# Patient Record
Sex: Male | Born: 1970 | Race: White | Hispanic: No | Marital: Married | Smoking: Current some day smoker
Health system: Southern US, Community
[De-identification: ages and names within clinical notes are randomized; demographics above are authoritative.]

## PROBLEM LIST (undated history)

## (undated) DIAGNOSIS — I10 Essential (primary) hypertension: Secondary | ICD-10-CM

## (undated) DIAGNOSIS — F17200 Nicotine dependence, unspecified, uncomplicated: Secondary | ICD-10-CM

## (undated) DIAGNOSIS — I5021 Acute systolic (congestive) heart failure: Secondary | ICD-10-CM

## (undated) DIAGNOSIS — G4733 Obstructive sleep apnea (adult) (pediatric): Secondary | ICD-10-CM

## (undated) DIAGNOSIS — E1151 Type 2 diabetes mellitus with diabetic peripheral angiopathy without gangrene: Secondary | ICD-10-CM

## (undated) DIAGNOSIS — Z95810 Presence of automatic (implantable) cardiac defibrillator: Secondary | ICD-10-CM

## (undated) DIAGNOSIS — E11621 Type 2 diabetes mellitus with foot ulcer: Secondary | ICD-10-CM

## (undated) DIAGNOSIS — E119 Type 2 diabetes mellitus without complications: Secondary | ICD-10-CM

## (undated) DIAGNOSIS — I739 Peripheral vascular disease, unspecified: Secondary | ICD-10-CM

## (undated) DIAGNOSIS — K219 Gastro-esophageal reflux disease without esophagitis: Secondary | ICD-10-CM

## (undated) DIAGNOSIS — M869 Osteomyelitis, unspecified: Secondary | ICD-10-CM

## (undated) HISTORY — DX: Obstructive sleep apnea (adult) (pediatric): G47.33

---
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
## 1999-03-07 ENCOUNTER — Emergency Department (HOSPITAL_COMMUNITY): Admission: EM | Admit: 1999-03-07 | Discharge: 1999-03-07 | Payer: Self-pay | Admitting: Emergency Medicine

## 2001-10-09 ENCOUNTER — Emergency Department (HOSPITAL_COMMUNITY): Admission: EM | Admit: 2001-10-09 | Discharge: 2001-10-09 | Payer: Self-pay | Admitting: Emergency Medicine

## 2002-04-04 ENCOUNTER — Encounter: Payer: Self-pay | Admitting: Emergency Medicine

## 2002-04-04 ENCOUNTER — Emergency Department (HOSPITAL_COMMUNITY): Admission: EM | Admit: 2002-04-04 | Discharge: 2002-04-04 | Payer: Self-pay | Admitting: Emergency Medicine

## 2003-04-03 ENCOUNTER — Encounter: Admission: RE | Admit: 2003-04-03 | Discharge: 2003-07-02 | Payer: Self-pay | Admitting: Internal Medicine

## 2004-06-14 ENCOUNTER — Ambulatory Visit: Payer: Self-pay | Admitting: Internal Medicine

## 2004-06-27 ENCOUNTER — Ambulatory Visit: Payer: Self-pay | Admitting: Pulmonary Disease

## 2004-10-03 ENCOUNTER — Ambulatory Visit: Payer: Self-pay | Admitting: Internal Medicine

## 2005-10-31 ENCOUNTER — Ambulatory Visit: Payer: Self-pay | Admitting: Internal Medicine

## 2006-04-06 ENCOUNTER — Ambulatory Visit: Payer: Self-pay | Admitting: Internal Medicine

## 2006-04-18 ENCOUNTER — Ambulatory Visit: Payer: Self-pay | Admitting: Internal Medicine

## 2006-12-30 ENCOUNTER — Encounter: Payer: Self-pay | Admitting: Internal Medicine

## 2007-01-01 DIAGNOSIS — J309 Allergic rhinitis, unspecified: Secondary | ICD-10-CM | POA: Insufficient documentation

## 2007-01-01 DIAGNOSIS — I10 Essential (primary) hypertension: Secondary | ICD-10-CM | POA: Insufficient documentation

## 2007-01-01 DIAGNOSIS — F528 Other sexual dysfunction not due to a substance or known physiological condition: Secondary | ICD-10-CM | POA: Insufficient documentation

## 2007-01-01 DIAGNOSIS — K219 Gastro-esophageal reflux disease without esophagitis: Secondary | ICD-10-CM | POA: Insufficient documentation

## 2007-01-01 DIAGNOSIS — E669 Obesity, unspecified: Secondary | ICD-10-CM | POA: Insufficient documentation

## 2007-08-04 ENCOUNTER — Ambulatory Visit: Payer: Self-pay | Admitting: Internal Medicine

## 2007-08-04 DIAGNOSIS — M79609 Pain in unspecified limb: Secondary | ICD-10-CM | POA: Insufficient documentation

## 2007-08-05 ENCOUNTER — Encounter: Payer: Self-pay | Admitting: Internal Medicine

## 2007-08-05 LAB — CONVERTED CEMR LAB
ALT: 25 units/L (ref 0–53)
AST: 13 units/L (ref 0–37)
Albumin: 4.2 g/dL (ref 3.5–5.2)
Alkaline Phosphatase: 81 units/L (ref 39–117)
BUN: 6 mg/dL (ref 6–23)
Basophils Absolute: 0.1 10*3/uL (ref 0.0–0.1)
Basophils Relative: 0.6 % (ref 0.0–1.0)
Bilirubin Urine: NEGATIVE
Bilirubin, Direct: 0.1 mg/dL (ref 0.0–0.3)
CO2: 29 meq/L (ref 19–32)
Calcium: 9.5 mg/dL (ref 8.4–10.5)
Chloride: 104 meq/L (ref 96–112)
Cholesterol: 256 mg/dL (ref 0–200)
Creatinine, Ser: 0.9 mg/dL (ref 0.4–1.5)
Creatinine,U: 53.8 mg/dL
Direct LDL: 209 mg/dL
Eosinophils Absolute: 0.1 10*3/uL (ref 0.0–0.7)
Eosinophils Relative: 1 % (ref 0.0–5.0)
GFR calc Af Amer: 123 mL/min
GFR calc non Af Amer: 101 mL/min
Glucose, Bld: 320 mg/dL — ABNORMAL HIGH (ref 70–99)
HCT: 43.2 % (ref 39.0–52.0)
HDL: 43.5 mg/dL (ref >39.0)
Hemoglobin, Urine: NEGATIVE
Hemoglobin: 14.2 g/dL (ref 13.0–17.0)
Hgb A1c MFr Bld: 12.8 % — ABNORMAL HIGH (ref 4.6–6.0)
Ketones, ur: NEGATIVE mg/dL
Leukocytes, UA: NEGATIVE
Lymphocytes Relative: 39.7 % (ref 12.0–46.0)
MCHC: 32.9 g/dL (ref 30.0–36.0)
MCV: 88.1 fL (ref 78.0–100.0)
Microalb Creat Ratio: 26 mg/g (ref 0.0–30.0)
Microalb, Ur: 1.4 mg/dL (ref 0.0–1.9)
Monocytes Absolute: 0.6 10*3/uL (ref 0.1–1.0)
Monocytes Relative: 5.5 % (ref 3.0–12.0)
Neutro Abs: 5.3 10*3/uL (ref 1.4–7.7)
Neutrophils Relative %: 53.2 % (ref 43.0–77.0)
Nitrite: NEGATIVE
Platelets: 303 10*3/uL (ref 150–400)
Potassium: 4 meq/L (ref 3.5–5.1)
RBC: 4.9 M/uL (ref 4.22–5.81)
RDW: 13.2 % (ref 11.5–14.6)
Sodium: 138 meq/L (ref 135–145)
Specific Gravity, Urine: 1.01 (ref 1.000–1.03)
TSH: 0.43 microintl units/mL (ref 0.35–5.50)
Total Bilirubin: 0.8 mg/dL (ref 0.3–1.2)
Total CHOL/HDL Ratio: 5.9
Total Protein, Urine: NEGATIVE mg/dL
Total Protein: 7 g/dL (ref 6.0–8.3)
Triglycerides: 118 mg/dL (ref 0–149)
Urine Glucose: >=1000 mg/dL — CR
Urobilinogen, UA: 0.2 (ref 0.0–1.0)
VLDL: 24 mg/dL (ref 0–40)
WBC: 10.1 10*3/uL (ref 4.5–10.5)
pH: 5.5 (ref 5.0–8.0)

## 2007-11-16 ENCOUNTER — Ambulatory Visit: Payer: Self-pay | Admitting: Internal Medicine

## 2007-11-16 LAB — CONVERTED CEMR LAB
BUN: 6 mg/dL (ref 6–23)
CO2: 27 meq/L (ref 19–32)
Calcium: 9.4 mg/dL (ref 8.4–10.5)
Chloride: 104 meq/L (ref 96–112)
Cholesterol: 171 mg/dL (ref 0–200)
Creatinine, Ser: 0.9 mg/dL (ref 0.4–1.5)
GFR calc Af Amer: 123 mL/min
GFR calc non Af Amer: 101 mL/min
Glucose, Bld: 127 mg/dL — ABNORMAL HIGH (ref 70–99)
HDL: 41.8 mg/dL (ref >39.0)
Hgb A1c MFr Bld: 9.9 % — ABNORMAL HIGH (ref 4.6–6.0)
LDL Cholesterol: 121 mg/dL — ABNORMAL HIGH (ref 0–99)
Potassium: 3.7 meq/L (ref 3.5–5.1)
Sodium: 140 meq/L (ref 135–145)
Total CHOL/HDL Ratio: 4.1
Triglycerides: 40 mg/dL (ref 0–149)
VLDL: 8 mg/dL (ref 0–40)

## 2007-11-19 ENCOUNTER — Ambulatory Visit: Payer: Self-pay | Admitting: Internal Medicine

## 2009-03-19 ENCOUNTER — Ambulatory Visit: Payer: Self-pay | Admitting: Internal Medicine

## 2009-10-03 ENCOUNTER — Ambulatory Visit: Payer: Self-pay | Admitting: Internal Medicine

## 2014-11-16 ENCOUNTER — Inpatient Hospital Stay (HOSPITAL_COMMUNITY)
Admission: EM | Admit: 2014-11-16 | Discharge: 2014-11-21 | DRG: 571 | Disposition: A | Discharge status: Home / Self Care | Payer: 59 | Attending: General Surgery | Admitting: General Surgery

## 2014-11-16 ENCOUNTER — Encounter (HOSPITAL_COMMUNITY): Payer: Self-pay | Admitting: *Deleted

## 2014-11-16 ENCOUNTER — Emergency Department (HOSPITAL_COMMUNITY): Payer: 59

## 2014-11-16 DIAGNOSIS — K611 Rectal abscess: Secondary | ICD-10-CM | POA: Diagnosis present

## 2014-11-16 DIAGNOSIS — IMO0002 Reserved for concepts with insufficient information to code with codable children: Secondary | ICD-10-CM

## 2014-11-16 DIAGNOSIS — F17200 Nicotine dependence, unspecified, uncomplicated: Secondary | ICD-10-CM | POA: Diagnosis present

## 2014-11-16 DIAGNOSIS — E1165 Type 2 diabetes mellitus with hyperglycemia: Secondary | ICD-10-CM | POA: Diagnosis present

## 2014-11-16 DIAGNOSIS — L02214 Cutaneous abscess of groin: Principal | ICD-10-CM | POA: Diagnosis present

## 2014-11-16 DIAGNOSIS — Z791 Long term (current) use of non-steroidal anti-inflammatories (NSAID): Secondary | ICD-10-CM

## 2014-11-16 DIAGNOSIS — N1330 Unspecified hydronephrosis: Secondary | ICD-10-CM | POA: Diagnosis present

## 2014-11-16 DIAGNOSIS — Z6832 Body mass index (BMI) 32.0-32.9, adult: Secondary | ICD-10-CM

## 2014-11-16 DIAGNOSIS — Z8249 Family history of ischemic heart disease and other diseases of the circulatory system: Secondary | ICD-10-CM

## 2014-11-16 DIAGNOSIS — K6289 Other specified diseases of anus and rectum: Secondary | ICD-10-CM | POA: Diagnosis not present

## 2014-11-16 DIAGNOSIS — N493 Fournier gangrene: Secondary | ICD-10-CM

## 2014-11-16 DIAGNOSIS — D72829 Elevated white blood cell count, unspecified: Secondary | ICD-10-CM | POA: Diagnosis present

## 2014-11-16 DIAGNOSIS — L0291 Cutaneous abscess, unspecified: Secondary | ICD-10-CM

## 2014-11-16 DIAGNOSIS — F17210 Nicotine dependence, cigarettes, uncomplicated: Secondary | ICD-10-CM | POA: Diagnosis present

## 2014-11-16 DIAGNOSIS — K088 Other specified disorders of teeth and supporting structures: Secondary | ICD-10-CM | POA: Diagnosis present

## 2014-11-16 DIAGNOSIS — I10 Essential (primary) hypertension: Secondary | ICD-10-CM | POA: Diagnosis present

## 2014-11-16 HISTORY — DX: Nicotine dependence, unspecified, uncomplicated: F17.200

## 2014-11-16 HISTORY — DX: Type 2 diabetes mellitus without complications: E11.9

## 2014-11-16 LAB — COMPREHENSIVE METABOLIC PANEL
ALT: 23 U/L (ref 17–63)
AST: 19 U/L (ref 15–41)
Albumin: 3.8 g/dL (ref 3.5–5.0)
Alkaline Phosphatase: 120 U/L (ref 38–126)
Anion gap: 8 (ref 5–15)
BUN: 7 mg/dL (ref 6–20)
CO2: 27 mmol/L (ref 22–32)
Calcium: 9.2 mg/dL (ref 8.9–10.3)
Chloride: 95 mmol/L — ABNORMAL LOW (ref 101–111)
Creatinine, Ser: 0.92 mg/dL (ref 0.61–1.24)
GFR calc Af Amer: >60 mL/min (ref >60)
GFR calc non Af Amer: >60 mL/min (ref >60)
Glucose, Bld: 303 mg/dL — ABNORMAL HIGH (ref 65–99)
Potassium: 3.6 mmol/L (ref 3.5–5.1)
Sodium: 130 mmol/L — ABNORMAL LOW (ref 135–145)
Total Bilirubin: 1.4 mg/dL — ABNORMAL HIGH (ref 0.3–1.2)
Total Protein: 7.8 g/dL (ref 6.5–8.1)

## 2014-11-16 LAB — CBC WITH DIFFERENTIAL/PLATELET
BASOS ABS: 0 10*3/uL (ref 0.0–0.1)
Basophils Relative: 0 % (ref 0–1)
EOS ABS: 0 10*3/uL (ref 0.0–0.7)
Eosinophils Relative: 0 % (ref 0–5)
HCT: 35 % — ABNORMAL LOW (ref 39.0–52.0)
HEMOGLOBIN: 12.2 g/dL — AB (ref 13.0–17.0)
Lymphocytes Relative: 7 % — ABNORMAL LOW (ref 12–46)
Lymphs Abs: 1.6 10*3/uL (ref 0.7–4.0)
MCH: 29.8 pg (ref 26.0–34.0)
MCHC: 34.9 g/dL (ref 30.0–36.0)
MCV: 85.6 fL (ref 78.0–100.0)
MONO ABS: 1.5 10*3/uL — AB (ref 0.1–1.0)
MONOS PCT: 7 % (ref 3–12)
NEUTROS PCT: 86 % — AB (ref 43–77)
Neutro Abs: 18.6 10*3/uL — ABNORMAL HIGH (ref 1.7–7.7)
Platelets: 340 10*3/uL (ref 150–400)
RBC: 4.09 MIL/uL — AB (ref 4.22–5.81)
RDW: 13 % (ref 11.5–15.5)
WBC: 21.8 10*3/uL — AB (ref 4.0–10.5)

## 2014-11-16 LAB — I-STAT CG4 LACTIC ACID, ED: Lactic Acid, Venous: 1.32 mmol/L (ref 0.5–2.0)

## 2014-11-16 LAB — CBG MONITORING, ED: Glucose-Capillary: 269 mg/dL — ABNORMAL HIGH (ref 65–99)

## 2014-11-16 MED ORDER — IOHEXOL 300 MG/ML  SOLN: 25.0000 mL | Freq: Once | INTRAMUSCULAR | Status: AC | PRN

## 2014-11-16 MED ORDER — PIPERACILLIN-TAZOBACTAM 3.375 G IVPB
3.3750 g | Freq: Once | INTRAVENOUS | Status: AC
  Administered 2014-11-16: 3.375 g via INTRAVENOUS
  Filled 2014-11-16: qty 50

## 2014-11-16 MED ORDER — LIDOCAINE-EPINEPHRINE (PF) 2 %-1:200000 IJ SOLN
20.0000 mL | Freq: Once | INTRAMUSCULAR | Status: AC
  Administered 2014-11-16: 20 mL via INTRADERMAL
  Filled 2014-11-16: qty 20

## 2014-11-16 MED ORDER — SODIUM CHLORIDE 0.9 % IV BOLUS (SEPSIS)
1000.0000 mL | Freq: Once | INTRAVENOUS | Status: AC
  Administered 2014-11-16: 1000 mL via INTRAVENOUS

## 2014-11-16 MED ORDER — IOHEXOL 300 MG/ML  SOLN
100.0000 mL | Freq: Once | INTRAMUSCULAR | Status: AC | PRN
  Administered 2014-11-16: 100 mL via INTRAVENOUS

## 2014-11-16 MED ORDER — HYDROMORPHONE HCL 1 MG/ML IJ SOLN
1.0000 mg | Freq: Once | INTRAMUSCULAR | Status: AC
  Administered 2014-11-16: 1 mg via INTRAVENOUS
  Filled 2014-11-16: qty 1

## 2014-11-16 MED ORDER — INSULIN ASPART 100 UNIT/ML ~~LOC~~ SOLN
5.0000 [IU] | Freq: Once | SUBCUTANEOUS | Status: AC
  Administered 2014-11-16: 5 [IU] via SUBCUTANEOUS
  Filled 2014-11-16: qty 1

## 2014-11-16 MED ORDER — VANCOMYCIN HCL IN DEXTROSE 1-5 GM/200ML-% IV SOLN
1000.0000 mg | Freq: Once | INTRAVENOUS | Status: AC
  Administered 2014-11-16: 1000 mg via INTRAVENOUS
  Filled 2014-11-16: qty 200

## 2014-11-16 NOTE — ED Notes (Signed)
Surgeon at bedside.  

## 2014-11-16 NOTE — ED Notes (Signed)
Physician assisted with setup for bedside I&D. Requested NT to bedside to assist with procedure per MD request.

## 2014-11-16 NOTE — ED Notes (Signed)
Voicing no needs at this time/mild pelvic pain, made aware we are waiting on CT results.  Wife at bedside.

## 2014-11-16 NOTE — ED Notes (Signed)
Returned from CT.

## 2014-11-16 NOTE — ED Notes (Signed)
Medicated for pain with Dilaudid as ordered, patient transported to CT.  Family at bedside, patient voided 1100 cc's amber urine in urinal prior to transport to CT

## 2014-11-16 NOTE — ED Notes (Signed)
Pt, being sent by PCP, c/o perirectal abscess, hyperglycemia, and elevated WBC.  CBG 347 and WBC 21.9.  Pt reported that he quit taking DM medications x 4 months ago.

## 2014-11-16 NOTE — ED Notes (Signed)
Pt sent from pcp today for perirectal abscess x1.5 weeks. Pt went to dentist on Tuesday for abscess tooth, was started on amoxicillin. Pt reports rectal pain 10/10. Slight draining of wound. Pt hx of DM, stopped taking medications due to finances a few months ago.

## 2014-11-16 NOTE — ED Provider Notes (Signed)
CSN: [id number]     Arrival date & time 11/16/14  1450 History   First MD Initiated Contact with Patient 11/16/14 1747     Chief Complaint  Patient presents with  . Abscess     (Consider location/radiation/quality/duration/timing/severity/associated sxs/prior Treatment) HPI Comments: The patient is a 44 year old male, he has a history of diabetes, he used to take medications up until 4 months ago when he stopped taking those medications. He has had approximately 10 days of a gradual onset, progressively worsening and now severe mass with swelling, purulent drainage, surrounding redness and severe pain in his right perirectal area extending into the right scrotum. This is worse with palpation, difficult to ambulate, has been on amoxicillin for a toothache several days ago, this is not improving his symptoms. He has pain with bowel movements as well. He also endorses having frequent urination, frequent thirst, was seen at urgent care today and redirected to the emergency department for this abscess.  Patient is a 44 y.o. male presenting with abscess. The history is provided by the patient.  Abscess   Past Medical History  Diagnosis Date  . Diabetes mellitus without complication    History reviewed. No pertinent past surgical history. History reviewed. No pertinent family history. History  Substance Use Topics  . Smoking status: Current Every Day Smoker -- 1.00 packs/day for 24 years    Types: Cigarettes  . Smokeless tobacco: Not on file  . Alcohol Use: Yes    Review of Systems  All other systems reviewed and are negative.     Allergies  Review of patient's allergies indicates no known allergies.  Home Medications   Prior to Admission medications   Medication Sig Start Date End Date Taking? Authorizing Provider  amoxicillin (AMOXIL) 500 MG tablet Take 500 mg by mouth 4 (four) times daily.   Yes Historical Provider, MD  ibuprofen (ADVIL,MOTRIN) 200 MG tablet Take 600 mg by  mouth every 6 (six) hours as needed for headache, mild pain or moderate pain.   Yes Historical Provider, MD   BP 141/71 mmHg  Pulse 95  Temp(Src) 100.3 F (37.9 C) (Oral)  Resp 18  SpO2 100% Physical Exam  Constitutional: He appears well-developed and well-nourished. No distress.  HENT:  Head: Normocephalic and atraumatic.  Mouth/Throat: Oropharynx is clear and moist. No oropharyngeal exudate.  Eyes: Conjunctivae and EOM are normal. Pupils are equal, round, and reactive to light. Right eye exhibits no discharge. Left eye exhibits no discharge. No scleral icterus.  Neck: Normal range of motion. Neck supple. No JVD present. No thyromegaly present.  Cardiovascular: Normal rate, regular rhythm, normal heart sounds and intact distal pulses.  Exam reveals no gallop and no friction rub.   No murmur heard. Pulmonary/Chest: Effort normal and breath sounds normal. No respiratory distress. He has no wheezes. He has no rales.  Abdominal: Soft. Bowel sounds are normal. He exhibits no distension and no mass. There is no tenderness.  Genitourinary:  Spontaneously draining sinus into the abscess of the right perirectal area extending into the right perineum and scrotum, normal-appearing penis and testicles, no lymphadenopathy of the groin, no redness of the inner thighs, normal-appearing anus  Musculoskeletal: Normal range of motion. He exhibits no edema or tenderness.  Lymphadenopathy:    He has no cervical adenopathy.  Neurological: He is alert. Coordination normal.  Skin: Skin is warm and dry. No rash noted. No erythema.  Psychiatric: He has a normal mood and affect. His behavior is normal.  Nursing note and  vitals reviewed.   ED Course  Procedures (including critical care time) Labs Review Labs Reviewed  COMPREHENSIVE METABOLIC PANEL - Abnormal; Notable for the following:    Sodium 130 (*)    Chloride 95 (*)    Glucose, Bld 303 (*)    Total Bilirubin 1.4 (*)    All other components within  normal limits  CBC WITH DIFFERENTIAL/PLATELET - Abnormal; Notable for the following:    WBC 21.8 (*)    RBC 4.09 (*)    Hemoglobin 12.2 (*)    HCT 35.0 (*)    Neutrophils Relative % 86 (*)    Neutro Abs 18.6 (*)    Lymphocytes Relative 7 (*)    Monocytes Absolute 1.5 (*)    All other components within normal limits  CBG MONITORING, ED - Abnormal; Notable for the following:    Glucose-Capillary 269 (*)    All other components within normal limits  CULTURE, BLOOD (ROUTINE X 2)  CULTURE, BLOOD (ROUTINE X 2)  I-STAT CG4 LACTIC ACID, ED    Imaging Review Ct Abdomen Pelvis W Contrast  11/16/2014   ADDENDUM REPORT: 11/16/2014 22:57  ADDENDUM: Additional images are obtained and addended to the previous study. Images of the perineum inferior to the field of view of prior study are obtained.  Additional images demonstrate infiltration in the subcutaneous fat of the right groin and right perineal region extending to the medial aspect of the upper thigh and to the right side of the scrotum. Soft tissue gas collections are demonstrated in the peroneal tissues and medial right thigh region consistent with cellulitis due to infection with a gas-forming organism. No discrete fluid collection is identified to suggest a discrete abscess. Enlarged lymph nodes in the groin regions are likely reactive.  IMPRESSION: Cellulitis in the right groin, right peroneal region, and upper medial thigh extending to the scrotum with gas collections in the perineal fat consistent with infection with gas-forming organism.   Electronically Signed   By: Lucienne Capers M.D.   On: 11/16/2014 22:57   11/16/2014   CLINICAL DATA:  Patient was sent from primary care physician today for perirectal abscess for 1.5 weeks. Drainage is noted.  EXAM: CT ABDOMEN AND PELVIS WITH CONTRAST  TECHNIQUE: Multidetector CT imaging of the abdomen and pelvis was performed using the standard protocol following bolus administration of intravenous  contrast.  CONTRAST:  147m OMNIPAQUE IOHEXOL 300 MG/ML  SOLN  COMPARISON:  None.  FINDINGS: Atelectasis in the lung bases. The liver, spleen, gallbladder, pancreas, adrenal glands, inferior vena cava, and retroperitoneal lymph nodes are unremarkable. Calcification of the abdominal aorta without aneurysm. There is hydronephrosis with mild hydroureter on the left. No obstructing stone or mass is identified. This could indicate obstruction due to a stricture or non radiopaque stone or it may be due to reflux. Nephrograms are symmetrical. Right kidney in ureter are unremarkable. Stomach is decompressed. Small bowel and colon are not abnormally distended. A few fluid-filled bowel loops are present in the left lower quadrant without distention or wall thickening. This is likely due to normal peristalsis. No free air or free fluid in the abdomen.  Pelvis: The appendix is normal. Prostate gland is mildly enlarged. Bladder wall is not thickened although the bladder is mildly distended no free or loculated pelvic fluid collections. Moderately prominent lymph nodes in the groin regions bilaterally, largest measuring 16 mm short axis dimension. This is nonspecific but could indicate reactive nodes. There is suggestion of mild infiltration in the perineal fat  anteriorly to the right, extending to the right groin. This may represent inflammatory change. No perirectal abscess or infiltration is demonstrated. Mild degenerative changes the spine. No destructive bone lesions.  IMPRESSION: No perirectal abscess is demonstrated and field of view. There is mild infiltration in the anterior peroneal and right groin fat with enlarged groin lymph nodes which may indicate inflammatory reaction or cellulitis. Nonspecific appearance of left renal hydronephrosis without cause of obstruction.  Electronically Signed: By: Lucienne Capers M.D. On: 11/16/2014 21:16      MDM   Final diagnoses:  Abscess  Fournier gangrene    The patient  does have a fever, he has a abscess in the right perineum with subcutaneous emphysema concerning for significant infection. He is an uncontrolled diabetic and will need fluids, medications, broad-spectrum metabolic syndrome pelvic imaging to rule out necrotizing fasciitis or Fournier's gangrene.  The patient is a fever, a significant elevation in the white blood cell count and a clinical exam consistent with gas forming bacterial infection of the skin. He will need to be admitted to the hospital, IV antibodies have been ordered, CT scan confirms diffuse gas forming organisms in the tissues consistent with a Fournier's gangrene. The patient has a normal lactic acid, no hypotension, no tachycardia. He will be admitted by Dr. Clarise Cruz with general surgery.  Meds given in ED:  Medications  iohexol (OMNIPAQUE) 300 MG/ML solution 25 mL (not administered)  insulin aspart (novoLOG) injection 5 Units (not administered)  vancomycin (VANCOCIN) IVPB 1000 mg/200 mL premix (not administered)  sodium chloride 0.9 % bolus 1,000 mL (0 mLs Intravenous Stopped 11/16/14 2236)  sodium chloride 0.9 % bolus 1,000 mL (0 mLs Intravenous Stopped 11/16/14 2236)  piperacillin-tazobactam (ZOSYN) IVPB 3.375 g (0 g Intravenous Stopped 11/16/14 2236)  HYDROmorphone (DILAUDID) injection 1 mg (1 mg Intravenous Given 11/16/14 2031)  iohexol (OMNIPAQUE) 300 MG/ML solution 100 mL (100 mLs Intravenous Contrast Given 11/16/14 2040)     Noemi Chapel, MD 11/16/14 2318

## 2014-11-16 NOTE — H&P (Signed)
Christopher Roth is an 44 y.o. male.   Chief Complaint: Painful perianal mass HPI:   This is a 44 year old male with diabetes who is not been able to afford his medications for the past 3-4 months. About 7-10 days ago, he noticed a painful lump in the right perianal area that has slowly grown up toward the scrotum. It has started to do drain as well.  He states his bowel and bladder habits have not been any different. He denies chills but has had some fever.  He initially was seen at urgent care with end was instructed to come to the emergency room. CT scan demonstrated a complex right perirectal abscess with gas present. He actually was on some amoxicillin for toothache for the past several days but this has not improved his condition.  Past Medical History  Diagnosis Date  . Diabetes mellitus without complication     History reviewed. No pertinent past surgical history.  History reviewed. No pertinent family history. Social History:  reports that he has been smoking Cigarettes.  He has a 24 pack-year smoking history. He does not have any smokeless tobacco history on file. He reports that he drinks alcohol. He reports that he does not use illicit drugs.  Allergies: No Known Allergies  Prior to Admission medications   Medication Sig Start Date End Date Taking? Authorizing Provider  amoxicillin (AMOXIL) 500 MG tablet Take 500 mg by mouth 4 (four) times daily.   Yes Historical Provider, MD  ibuprofen (ADVIL,MOTRIN) 200 MG tablet Take 600 mg by mouth every 6 (six) hours as needed for headache, mild pain or moderate pain.   Yes Historical Provider, MD    Results for orders placed or performed during the hospital encounter of 11/16/14 (from the past 48 hour(s))  CBG monitoring, ED     Status: Abnormal   Collection Time: 11/16/14  3:13 PM  Result Value Ref Range   Glucose-Capillary 269 (H) 65 - 99 mg/dL  Comprehensive metabolic panel     Status: Abnormal   Collection Time: 11/16/14  3:43 PM   Result Value Ref Range   Sodium 130 (L) 135 - 145 mmol/L   Potassium 3.6 3.5 - 5.1 mmol/L   Chloride 95 (L) 101 - 111 mmol/L   CO2 27 22 - 32 mmol/L   Glucose, Bld 303 (H) 65 - 99 mg/dL   BUN 7 6 - 20 mg/dL   Creatinine, Ser 0.92 0.61 - 1.24 mg/dL   Calcium 9.2 8.9 - 10.3 mg/dL   Total Protein 7.8 6.5 - 8.1 g/dL   Albumin 3.8 3.5 - 5.0 g/dL   AST 19 15 - 41 U/L   ALT 23 17 - 63 U/L   Alkaline Phosphatase 120 38 - 126 U/L   Total Bilirubin 1.4 (H) 0.3 - 1.2 mg/dL   GFR calc non Af Amer >60 >60 mL/min   GFR calc Af Amer >60 >60 mL/min    Comment: (NOTE) The eGFR has been calculated using the CKD EPI equation. This calculation has not been validated in all clinical situations. eGFR's persistently <60 mL/min signify possible Chronic Kidney Disease.    Anion gap 8 5 - 15  CBC with Differential     Status: Abnormal   Collection Time: 11/16/14  3:43 PM  Result Value Ref Range   WBC 21.8 (H) 4.0 - 10.5 K/uL   RBC 4.09 (L) 4.22 - 5.81 MIL/uL   Hemoglobin 12.2 (L) 13.0 - 17.0 g/dL   HCT 35.0 (L) 39.0 -  52.0 %   MCV 85.6 78.0 - 100.0 fL   MCH 29.8 26.0 - 34.0 pg   MCHC 34.9 30.0 - 36.0 g/dL   RDW 13.0 11.5 - 15.5 %   Platelets 340 150 - 400 K/uL   Neutrophils Relative % 86 (H) 43 - 77 %   Neutro Abs 18.6 (H) 1.7 - 7.7 K/uL   Lymphocytes Relative 7 (L) 12 - 46 %   Lymphs Abs 1.6 0.7 - 4.0 K/uL   Monocytes Relative 7 3 - 12 %   Monocytes Absolute 1.5 (H) 0.1 - 1.0 K/uL   Eosinophils Relative 0 0 - 5 %   Eosinophils Absolute 0.0 0.0 - 0.7 K/uL   Basophils Relative 0 0 - 1 %   Basophils Absolute 0.0 0.0 - 0.1 K/uL  I-Stat CG4 Lactic Acid, ED     Status: None   Collection Time: 11/16/14  7:06 PM  Result Value Ref Range   Lactic Acid, Venous 1.32 0.5 - 2.0 mmol/L  Blood culture (routine x 2)     Status: None (Preliminary result)   Collection Time: 11/16/14  7:06 PM  Result Value Ref Range   Specimen Description BLOOD BLOOD RIGHT FOREARM    Special Requests BOTTLES DRAWN AEROBIC  AND ANAEROBIC 4CC    Culture PENDING    Report Status PENDING    Ct Abdomen Pelvis W Contrast  11/16/2014   ADDENDUM REPORT: 11/16/2014 22:57  ADDENDUM: Additional images are obtained and addended to the previous study. Images of the perineum inferior to the field of view of prior study are obtained.  Additional images demonstrate infiltration in the subcutaneous fat of the right groin and right perineal region extending to the medial aspect of the upper thigh and to the right side of the scrotum. Soft tissue gas collections are demonstrated in the peroneal tissues and medial right thigh region consistent with cellulitis due to infection with a gas-forming organism. No discrete fluid collection is identified to suggest a discrete abscess. Enlarged lymph nodes in the groin regions are likely reactive.  IMPRESSION: Cellulitis in the right groin, right peroneal region, and upper medial thigh extending to the scrotum with gas collections in the perineal fat consistent with infection with gas-forming organism.   Electronically Signed   By: Lucienne Capers M.D.   On: 11/16/2014 22:57   11/16/2014   CLINICAL DATA:  Patient was sent from primary care physician today for perirectal abscess for 1.5 weeks. Drainage is noted.  EXAM: CT ABDOMEN AND PELVIS WITH CONTRAST  TECHNIQUE: Multidetector CT imaging of the abdomen and pelvis was performed using the standard protocol following bolus administration of intravenous contrast.  CONTRAST:  165m OMNIPAQUE IOHEXOL 300 MG/ML  SOLN  COMPARISON:  None.  FINDINGS: Atelectasis in the lung bases. The liver, spleen, gallbladder, pancreas, adrenal glands, inferior vena cava, and retroperitoneal lymph nodes are unremarkable. Calcification of the abdominal aorta without aneurysm. There is hydronephrosis with mild hydroureter on the left. No obstructing stone or mass is identified. This could indicate obstruction due to a stricture or non radiopaque stone or it may be due to reflux.  Nephrograms are symmetrical. Right kidney in ureter are unremarkable. Stomach is decompressed. Small bowel and colon are not abnormally distended. A few fluid-filled bowel loops are present in the left lower quadrant without distention or wall thickening. This is likely due to normal peristalsis. No free air or free fluid in the abdomen.  Pelvis: The appendix is normal. Prostate gland is mildly enlarged. Bladder  wall is not thickened although the bladder is mildly distended no free or loculated pelvic fluid collections. Moderately prominent lymph nodes in the groin regions bilaterally, largest measuring 16 mm short axis dimension. This is nonspecific but could indicate reactive nodes. There is suggestion of mild infiltration in the perineal fat anteriorly to the right, extending to the right groin. This may represent inflammatory change. No perirectal abscess or infiltration is demonstrated. Mild degenerative changes the spine. No destructive bone lesions.  IMPRESSION: No perirectal abscess is demonstrated and field of view. There is mild infiltration in the anterior peroneal and right groin fat with enlarged groin lymph nodes which may indicate inflammatory reaction or cellulitis. Nonspecific appearance of left renal hydronephrosis without cause of obstruction.  Electronically Signed: By: Lucienne Capers M.D. On: 11/16/2014 21:16    Review of Systems  Constitutional: Positive for fever. Negative for chills.  Respiratory: Negative for shortness of breath.   Cardiovascular: Negative for chest pain.  Gastrointestinal: Negative for nausea, vomiting, abdominal pain, diarrhea, constipation and blood in stool.  Genitourinary: Negative for dysuria and hematuria.  Endo/Heme/Allergies: Does not bruise/bleed easily.    Blood pressure 141/71, pulse 95, temperature 100.3 F (37.9 C), temperature source Oral, resp. rate 18, SpO2 100 %. Physical Exam  Constitutional: He appears well-developed and well-nourished.  No distress.  HENT:  Head: Normocephalic and atraumatic.  Cardiovascular: Normal rate and regular rhythm.   Respiratory: Effort normal and breath sounds normal.  GI: Soft. Bowel sounds are normal. He exhibits no mass. There is no tenderness.  Genitourinary:  Right perianal swelling extending up toward the scrotal area with necrotic-appearing tissue and some purulent drainage from small sinus tracts. There is tenderness with induration, fluctuance, and erythema present.  Musculoskeletal: He exhibits no edema.  Neurological: He is alert.  Skin: Skin is warm and dry.  Psychiatric: He has a normal mood and affect. His behavior is normal.     Assessment/Plan 1. Complex right perirectal abscess  2. Poorly controlled diabetes mellitus  Plan: Bedside incision and drainage. Broad-spectrum antibiotics. Aggressive blood sugar control. May need more formal debridement in the operating room later today. This was discussed with him and he agrees with the plan.   Preciosa Bundrick J 11/16/2014, 11:33 PM

## 2014-11-16 NOTE — ED Notes (Signed)
Patient requesting pain medication, Dr. Sabra Heck made aware and order noted for Dilaudid

## 2014-11-16 NOTE — ED Notes (Signed)
Pt reports perirectal abscess x 10 days, which is draining.  Pt has hx of DM and has not been taking his meds for a long time d/t financial issues.

## 2014-11-17 ENCOUNTER — Inpatient Hospital Stay (HOSPITAL_COMMUNITY): Payer: 59 | Admitting: Anesthesiology

## 2014-11-17 ENCOUNTER — Encounter (HOSPITAL_COMMUNITY): Admission: EM | Disposition: A | Discharge status: Home / Self Care | Payer: Self-pay | Source: Home / Self Care

## 2014-11-17 DIAGNOSIS — K611 Rectal abscess: Secondary | ICD-10-CM | POA: Diagnosis not present

## 2014-11-17 DIAGNOSIS — L02214 Cutaneous abscess of groin: Secondary | ICD-10-CM | POA: Diagnosis present

## 2014-11-17 DIAGNOSIS — Z791 Long term (current) use of non-steroidal anti-inflammatories (NSAID): Secondary | ICD-10-CM | POA: Diagnosis not present

## 2014-11-17 DIAGNOSIS — Z6832 Body mass index (BMI) 32.0-32.9, adult: Secondary | ICD-10-CM | POA: Diagnosis not present

## 2014-11-17 DIAGNOSIS — F17210 Nicotine dependence, cigarettes, uncomplicated: Secondary | ICD-10-CM | POA: Diagnosis present

## 2014-11-17 DIAGNOSIS — Z8249 Family history of ischemic heart disease and other diseases of the circulatory system: Secondary | ICD-10-CM | POA: Diagnosis not present

## 2014-11-17 DIAGNOSIS — E1165 Type 2 diabetes mellitus with hyperglycemia: Secondary | ICD-10-CM | POA: Diagnosis present

## 2014-11-17 DIAGNOSIS — N1330 Unspecified hydronephrosis: Secondary | ICD-10-CM | POA: Diagnosis present

## 2014-11-17 DIAGNOSIS — N493 Fournier gangrene: Secondary | ICD-10-CM | POA: Diagnosis not present

## 2014-11-17 DIAGNOSIS — D72829 Elevated white blood cell count, unspecified: Secondary | ICD-10-CM | POA: Diagnosis present

## 2014-11-17 DIAGNOSIS — K088 Other specified disorders of teeth and supporting structures: Secondary | ICD-10-CM | POA: Diagnosis present

## 2014-11-17 DIAGNOSIS — K6289 Other specified diseases of anus and rectum: Secondary | ICD-10-CM | POA: Diagnosis present

## 2014-11-17 DIAGNOSIS — I10 Essential (primary) hypertension: Secondary | ICD-10-CM | POA: Diagnosis present

## 2014-11-17 HISTORY — PX: INCISION AND DRAINAGE PERIRECTAL ABSCESS: SHX1804

## 2014-11-17 LAB — CBC
HCT: 31 % — ABNORMAL LOW (ref 39.0–52.0)
HEMATOCRIT: 30 % — AB (ref 39.0–52.0)
Hemoglobin: 10.6 g/dL — ABNORMAL LOW (ref 13.0–17.0)
Hemoglobin: 10.2 g/dL — ABNORMAL LOW (ref 13.0–17.0)
MCH: 29 pg (ref 26.0–34.0)
MCH: 29.5 pg (ref 26.0–34.0)
MCHC: 34.2 g/dL (ref 30.0–36.0)
MCHC: 34 g/dL (ref 30.0–36.0)
MCV: 84.9 fL (ref 78.0–100.0)
MCV: 86.7 fL (ref 78.0–100.0)
PLATELETS: 280 10*3/uL (ref 150–400)
Platelets: 279 10*3/uL (ref 150–400)
RBC: 3.65 MIL/uL — AB (ref 4.22–5.81)
RBC: 3.46 MIL/uL — ABNORMAL LOW (ref 4.22–5.81)
RDW: 13.1 % (ref 11.5–15.5)
RDW: 13.3 % (ref 11.5–15.5)
WBC: 17.3 10*3/uL — AB (ref 4.0–10.5)
WBC: 17.8 10*3/uL — AB (ref 4.0–10.5)

## 2014-11-17 LAB — BASIC METABOLIC PANEL
ANION GAP: 5 (ref 5–15)
BUN: 6 mg/dL (ref 6–20)
CO2: 26 mmol/L (ref 22–32)
Calcium: 8.1 mg/dL — ABNORMAL LOW (ref 8.9–10.3)
Chloride: 107 mmol/L (ref 101–111)
Creatinine, Ser: 0.91 mg/dL (ref 0.61–1.24)
GFR calc Af Amer: >60 mL/min (ref >60)
GFR calc non Af Amer: >60 mL/min (ref >60)
Glucose, Bld: 132 mg/dL — ABNORMAL HIGH (ref 65–99)
POTASSIUM: 3.2 mmol/L — AB (ref 3.5–5.1)
SODIUM: 138 mmol/L (ref 135–145)

## 2014-11-17 LAB — GLUCOSE, CAPILLARY
GLUCOSE-CAPILLARY: 130 mg/dL — AB (ref 65–99)
GLUCOSE-CAPILLARY: 89 mg/dL (ref 65–99)
Glucose-Capillary: 183 mg/dL — ABNORMAL HIGH (ref 65–99)
Glucose-Capillary: 117 mg/dL — ABNORMAL HIGH (ref 65–99)
Glucose-Capillary: 99 mg/dL (ref 65–99)
Glucose-Capillary: 116 mg/dL — ABNORMAL HIGH (ref 65–99)
Glucose-Capillary: 203 mg/dL — ABNORMAL HIGH (ref 65–99)

## 2014-11-17 LAB — CREATININE, SERUM
Creatinine, Ser: 0.78 mg/dL (ref 0.61–1.24)
GFR calc Af Amer: >60 mL/min (ref >60)

## 2014-11-17 LAB — SURGICAL PCR SCREEN
MRSA, PCR: NEGATIVE
STAPHYLOCOCCUS AUREUS: NEGATIVE

## 2014-11-17 SURGERY — INCISION AND DRAINAGE, ABSCESS, PERIRECTAL
Anesthesia: Choice | Laterality: Right

## 2014-11-17 MED ORDER — ONDANSETRON HCL 4 MG/2ML IJ SOLN
INTRAMUSCULAR | Status: AC
Start: 2014-11-17 — End: 2014-11-17
  Filled 2014-11-17: qty 2

## 2014-11-17 MED ORDER — LIDOCAINE HCL (CARDIAC) 20 MG/ML IV SOLN
INTRAVENOUS | Status: AC
  Filled 2014-11-17: qty 5

## 2014-11-17 MED ORDER — PROPOFOL 10 MG/ML IV BOLUS
INTRAVENOUS | Status: AC
  Filled 2014-11-17: qty 20

## 2014-11-17 MED ORDER — LIDOCAINE HCL (CARDIAC) 20 MG/ML IV SOLN
INTRAVENOUS | Status: DC | PRN
  Administered 2014-11-17: 100 mg via INTRAVENOUS

## 2014-11-17 MED ORDER — CISATRACURIUM BESYLATE 20 MG/10ML IV SOLN
INTRAVENOUS | Status: AC
  Filled 2014-11-17: qty 10

## 2014-11-17 MED ORDER — NEOSTIGMINE METHYLSULFATE 10 MG/10ML IV SOLN
INTRAVENOUS | Status: DC | PRN
  Administered 2014-11-17: 4 mg via INTRAVENOUS

## 2014-11-17 MED ORDER — INSULIN ASPART 100 UNIT/ML ~~LOC~~ SOLN
0.0000 [IU] | SUBCUTANEOUS | Status: DC
  Administered 2014-11-17: 4 [IU] via SUBCUTANEOUS
  Administered 2014-11-17: 7 [IU] via SUBCUTANEOUS
  Administered 2014-11-17 – 2014-11-18 (×2): 3 [IU] via SUBCUTANEOUS
  Administered 2014-11-18: 4 [IU] via SUBCUTANEOUS
  Administered 2014-11-18 (×2): 3 [IU] via SUBCUTANEOUS
  Administered 2014-11-18: 4 [IU] via SUBCUTANEOUS
  Administered 2014-11-19: 3 [IU] via SUBCUTANEOUS

## 2014-11-17 MED ORDER — SUCCINYLCHOLINE CHLORIDE 20 MG/ML IJ SOLN
INTRAMUSCULAR | Status: DC | PRN
  Administered 2014-11-17: 100 mg via INTRAVENOUS

## 2014-11-17 MED ORDER — MIDAZOLAM HCL 2 MG/2ML IJ SOLN: 0.5000 mg | Freq: Once | INTRAMUSCULAR | Status: DC | PRN

## 2014-11-17 MED ORDER — MIDAZOLAM HCL 5 MG/5ML IJ SOLN
INTRAMUSCULAR | Status: DC | PRN
  Administered 2014-11-17: 2 mg via INTRAVENOUS

## 2014-11-17 MED ORDER — HEPARIN SODIUM (PORCINE) 5000 UNIT/ML IJ SOLN
5000.0000 [IU] | Freq: Three times a day (TID) | INTRAMUSCULAR | Status: DC
  Filled 2014-11-17 (×4): qty 1

## 2014-11-17 MED ORDER — HYDROMORPHONE HCL 1 MG/ML IJ SOLN
1.0000 mg | Freq: Once | INTRAMUSCULAR | Status: AC
  Administered 2014-11-17: 1 mg via INTRAVENOUS

## 2014-11-17 MED ORDER — METOCLOPRAMIDE HCL 5 MG/ML IJ SOLN
INTRAMUSCULAR | Status: DC | PRN
  Administered 2014-11-17: 10 mg via INTRAVENOUS

## 2014-11-17 MED ORDER — LACTATED RINGERS IV SOLN: INTRAVENOUS | Status: AC

## 2014-11-17 MED ORDER — LACTATED RINGERS IV SOLN
INTRAVENOUS | Status: DC
  Administered 2014-11-17: 1000 mL via INTRAVENOUS

## 2014-11-17 MED ORDER — HYDROMORPHONE HCL 1 MG/ML IJ SOLN: 0.2500 mg | INTRAMUSCULAR | Status: DC | PRN

## 2014-11-17 MED ORDER — METOCLOPRAMIDE HCL 5 MG/ML IJ SOLN
INTRAMUSCULAR | Status: AC
  Filled 2014-11-17: qty 2

## 2014-11-17 MED ORDER — MIDAZOLAM HCL 2 MG/2ML IJ SOLN
INTRAMUSCULAR | Status: AC
  Filled 2014-11-17: qty 2

## 2014-11-17 MED ORDER — VANCOMYCIN HCL IN DEXTROSE 1-5 GM/200ML-% IV SOLN
1000.0000 mg | Freq: Three times a day (TID) | INTRAVENOUS | Status: DC
  Administered 2014-11-17 – 2014-11-20 (×10): 1000 mg via INTRAVENOUS
  Filled 2014-11-17 (×11): qty 200

## 2014-11-17 MED ORDER — SODIUM CHLORIDE 0.9 % IV SOLN
INTRAVENOUS | Status: DC
  Administered 2014-11-17: 08:00:00 via INTRAVENOUS
  Administered 2014-11-17 (×2): 125 mL/h via INTRAVENOUS
  Administered 2014-11-18 – 2014-11-19 (×3): via INTRAVENOUS

## 2014-11-17 MED ORDER — PANTOPRAZOLE SODIUM 40 MG IV SOLR
40.0000 mg | Freq: Every day | INTRAVENOUS | Status: DC
  Administered 2014-11-17 – 2014-11-19 (×4): 40 mg via INTRAVENOUS
  Filled 2014-11-17 (×4): qty 40

## 2014-11-17 MED ORDER — GLYCOPYRROLATE 0.2 MG/ML IJ SOLN
INTRAMUSCULAR | Status: DC | PRN
  Administered 2014-11-17: .5 mg via INTRAVENOUS

## 2014-11-17 MED ORDER — HYDROMORPHONE HCL 1 MG/ML IJ SOLN
0.5000 mg | INTRAMUSCULAR | Status: DC | PRN
  Administered 2014-11-17 – 2014-11-21 (×24): 1 mg via INTRAVENOUS
  Filled 2014-11-17 (×24): qty 1

## 2014-11-17 MED ORDER — CISATRACURIUM BESYLATE (PF) 10 MG/5ML IV SOLN
INTRAVENOUS | Status: DC | PRN
  Administered 2014-11-17: 2 mg via INTRAVENOUS

## 2014-11-17 MED ORDER — GLYCOPYRROLATE 0.2 MG/ML IJ SOLN
INTRAMUSCULAR | Status: AC
  Filled 2014-11-17: qty 3

## 2014-11-17 MED ORDER — BUPIVACAINE LIPOSOME 1.3 % IJ SUSP
20.0000 mL | Freq: Once | INTRAMUSCULAR | Status: DC
  Filled 2014-11-17: qty 20

## 2014-11-17 MED ORDER — HEPARIN SODIUM (PORCINE) 5000 UNIT/ML IJ SOLN
5000.0000 [IU] | Freq: Three times a day (TID) | INTRAMUSCULAR | Status: DC
  Administered 2014-11-18 – 2014-11-21 (×10): 5000 [IU] via SUBCUTANEOUS
  Filled 2014-11-17 (×13): qty 1

## 2014-11-17 MED ORDER — PROMETHAZINE HCL 25 MG/ML IJ SOLN
6.2500 mg | INTRAMUSCULAR | Status: DC | PRN
Start: 2014-11-17 — End: 2014-11-17

## 2014-11-17 MED ORDER — ONDANSETRON HCL 4 MG/2ML IJ SOLN: 4.0000 mg | INTRAMUSCULAR | Status: DC | PRN

## 2014-11-17 MED ORDER — FENTANYL CITRATE (PF) 100 MCG/2ML IJ SOLN
INTRAMUSCULAR | Status: DC | PRN
  Administered 2014-11-17 (×3): 50 ug via INTRAVENOUS

## 2014-11-17 MED ORDER — PIPERACILLIN-TAZOBACTAM 3.375 G IVPB
3.3750 g | Freq: Three times a day (TID) | INTRAVENOUS | Status: DC
Start: 2014-11-17 — End: 2014-11-17

## 2014-11-17 MED ORDER — HYDROMORPHONE HCL 1 MG/ML IJ SOLN
INTRAMUSCULAR | Status: AC
  Administered 2014-11-17: 1 mg
  Filled 2014-11-17: qty 1

## 2014-11-17 MED ORDER — NEOSTIGMINE METHYLSULFATE 10 MG/10ML IV SOLN
INTRAVENOUS | Status: AC
  Filled 2014-11-17: qty 1

## 2014-11-17 MED ORDER — PROPOFOL 10 MG/ML IV BOLUS
INTRAVENOUS | Status: DC | PRN
  Administered 2014-11-17: 160 mg via INTRAVENOUS

## 2014-11-17 MED ORDER — PIPERACILLIN-TAZOBACTAM 3.375 G IVPB
3.3750 g | Freq: Three times a day (TID) | INTRAVENOUS | Status: DC
  Administered 2014-11-17 – 2014-11-20 (×10): 3.375 g via INTRAVENOUS
  Filled 2014-11-17 (×12): qty 50

## 2014-11-17 MED ORDER — MEPERIDINE HCL 50 MG/ML IJ SOLN: 6.2500 mg | INTRAMUSCULAR | Status: DC | PRN

## 2014-11-17 MED ORDER — FENTANYL CITRATE (PF) 250 MCG/5ML IJ SOLN
INTRAMUSCULAR | Status: AC
Start: 2014-11-17 — End: 2014-11-17
  Filled 2014-11-17: qty 5

## 2014-11-17 MED ORDER — ONDANSETRON HCL 4 MG/2ML IJ SOLN
INTRAMUSCULAR | Status: DC | PRN
  Administered 2014-11-17: 4 mg via INTRAVENOUS

## 2014-11-17 SURGICAL SUPPLY — 22 items
BLADE SURG SZ20 CARB STEEL (BLADE) ×2 IMPLANT
BNDG GAUZE ELAST 4 BULKY (GAUZE/BANDAGES/DRESSINGS) ×1 IMPLANT
BRIEF STRETCH FOR OB PAD LRG (UNDERPADS AND DIAPERS) ×1 IMPLANT
COVER SURGICAL LIGHT HANDLE (MISCELLANEOUS) ×2 IMPLANT
DRAPE SHEET LG 3/4 BI-LAMINATE (DRAPES) ×2 IMPLANT
GAUZE IODOFORM PACK 1/2 7832 (GAUZE/BANDAGES/DRESSINGS) ×1 IMPLANT
GAUZE SPONGE 4X4 12PLY STRL (GAUZE/BANDAGES/DRESSINGS) ×1 IMPLANT
GAUZE SPONGE 4X4 16PLY XRAY LF (GAUZE/BANDAGES/DRESSINGS) ×1 IMPLANT
GLOVE BIOGEL M 8.0 STRL (GLOVE) ×2 IMPLANT
GLOVE BIOGEL PI IND STRL 7.0 (GLOVE) ×1 IMPLANT
GLOVE BIOGEL PI INDICATOR 7.0 (GLOVE) ×1
GOWN SPEC L4 XLG W/TWL (GOWN DISPOSABLE) ×1 IMPLANT
GOWN STRL REUS W/TWL LRG LVL3 (GOWN DISPOSABLE) ×1 IMPLANT
GOWN STRL REUS W/TWL XL LVL3 (GOWN DISPOSABLE) ×5 IMPLANT
KIT BASIN OR (CUSTOM PROCEDURE TRAY) ×2 IMPLANT
PACK LITHOTOMY IV (CUSTOM PROCEDURE TRAY) ×2 IMPLANT
PAD ABD 8X10 STRL (GAUZE/BANDAGES/DRESSINGS) ×1 IMPLANT
PENCIL BUTTON HOLSTER BLD 10FT (ELECTRODE) ×2 IMPLANT
SPONGE LAP 18X18 X RAY DECT (DISPOSABLE) ×1 IMPLANT
TOWEL OR 17X26 10 PK STRL BLUE (TOWEL DISPOSABLE) ×2 IMPLANT
TUBE ANAEROBIC SPECIMEN COL (MISCELLANEOUS) ×1 IMPLANT
YANKAUER SUCT BULB TIP 10FT TU (MISCELLANEOUS) ×3 IMPLANT

## 2014-11-17 NOTE — H&P (Signed)
Operative Note  Aldrin Engelhard male 44 y.o. 11/17/2014  PREOPERATIVE DX:  Complex right perirectal abscess  POSTOPERATIVE DX:  Same  PROCEDURE:   Complex incision and drainage of right perirectal abscess         Surgeon: Odis Hollingshead   Assistants: None  Anesthesia: Local anesthesia 1% buffered lidocaine  Indications:   This is a 44 year old diabetic male who presents with a complex right perirectal abscess. Drainage is planned at the bedside.    Procedure Detail:  The area of the prerectal abscess extending into the groin was sterilely prepped and anesthetized with Xylocaine. A longitudinal incision was made through the fluctuant areas and purulent fluid along with gas was evacuated. This extended up toward the groin area superiorly and down toward the rectum inferiorly. Loculations were broken up digitally and more pus drained. Some necrotic skin and subcutaneous tissue were debrided to leave more of an opening. The wound was then packed with saline moistened gauze.  He tolerated the procedure well without any complications. He will likely need a formal trip to the operating room for dressing change and more debridement.

## 2014-11-17 NOTE — Addendum Note (Signed)
Addendum  created 11/17/14 1523 by Annye Asa, MD   Modules edited: Anesthesia Attestations, Anesthesia Events, Narrator   Narrator:  Narrator: Event Log Edited

## 2014-11-17 NOTE — ED Notes (Signed)
Per Dr Zella Richer, patient ok to have "a few ice chips", patient given ice chips

## 2014-11-17 NOTE — Anesthesia Preprocedure Evaluation (Addendum)
Anesthesia Evaluation  Patient identified by MRN, date of birth, ID band Patient awake    Reviewed: Allergy & Precautions, NPO status , Patient's Chart, lab work & pertinent test results  History of Anesthesia Complications (+) history of anesthetic complications  Airway Mallampati: II  TM Distance: >3 FB Neck ROM: Full    Dental  (+) Loose, Missing, Chipped, Dental Advisory Given, Poor Dentition   Pulmonary Current Smoker,  breath sounds clear to auscultation        Cardiovascular hypertension (no meds), Rhythm:Regular Rate:Normal     Neuro/Psych negative neurological ROS     GI/Hepatic Neg liver ROS, GERD-  Poorly Controlled,  Endo/Other  diabetes (glu 130, does not take his insulin or hypoglycemic), Poorly ControlledMorbid obesity  Renal/GU negative Renal ROS     Musculoskeletal   Abdominal (+) + obese,   Peds  Hematology negative hematology ROS (+)   Anesthesia Other Findings   Reproductive/Obstetrics                            Anesthesia Physical Anesthesia Plan  ASA: III  Anesthesia Plan: General   Post-op Pain Management:    Induction: Intravenous  Airway Management Planned: Oral ETT  Additional Equipment:   Intra-op Plan:   Post-operative Plan: Extubation in OR  Informed Consent: I have reviewed the patients History and Physical, chart, labs and discussed the procedure including the risks, benefits and alternatives for the proposed anesthesia with the patient or authorized representative who has indicated his/her understanding and acceptance.   Dental advisory given  Plan Discussed with: CRNA and Surgeon  Anesthesia Plan Comments: (Plan routine monitors, GETA)        Anesthesia Quick Evaluation

## 2014-11-17 NOTE — Progress Notes (Signed)
ANTIBIOTIC CONSULT NOTE - INITIAL  Pharmacy Consult for Vancomycin Indication: Perirectal abscess  No Known Allergies  Patient Measurements: Height: 5' 8"  (172.7 cm) Weight: 211 lb 13.8 oz (96.1 kg) IBW/kg (Calculated) : 68.4  Vital Signs: Temp: 100 F (37.8 C) (07/15 0126) Temp Source: Oral (07/15 0126) BP: 131/72 mmHg (07/15 0126) Pulse Rate: 89 (07/15 0126) Intake/Output from previous day: 07/14 0701 - 07/15 0700 In: 1000 [I.V.:1000] Out: 1100 [Urine:1100] Intake/Output from this shift: Total I/O In: 1000 [I.V.:1000] Out: 1100 [Urine:1100]  Labs:  Recent Labs  11/16/14 1543  WBC 21.8*  HGB 12.2*  PLT 340  CREATININE 0.92   Estimated Creatinine Clearance: 116.4 mL/min (by C-G formula based on Cr of 0.92). No results for input(s): VANCOTROUGH, VANCOPEAK, VANCORANDOM, GENTTROUGH, GENTPEAK, GENTRANDOM, TOBRATROUGH, TOBRAPEAK, TOBRARND, AMIKACINPEAK, AMIKACINTROU, AMIKACIN in the last 72 hours.   Microbiology: Recent Results (from the past 720 hour(s))  Blood culture (routine x 2)     Status: None (Preliminary result)   Collection Time: 11/16/14  7:06 PM  Result Value Ref Range Status   Specimen Description BLOOD BLOOD RIGHT FOREARM  Final   Special Requests BOTTLES DRAWN AEROBIC AND ANAEROBIC 4CC  Final   Culture PENDING  Incomplete   Report Status PENDING  Incomplete    Medical History: Past Medical History  Diagnosis Date  . Diabetes mellitus without complication     Medications:  Scheduled:  . heparin  5,000 Units Subcutaneous 3 times per day  . insulin aspart  0-20 Units Subcutaneous 6 times per day  . pantoprazole (PROTONIX) IV  40 mg Intravenous QHS  . piperacillin-tazobactam (ZOSYN)  IV  3.375 g Intravenous Q8H  . vancomycin  1,000 mg Intravenous Q8H   Infusions:  . sodium chloride 125 mL/hr (11/17/14 0137)   Assessment:  44 yr male with noted poorly controlled diabetes presents with draining right perianal abscess  S/p bedside I&D; plans  for possible further debridement later today in OR  Zosyn q8h and Vancomycin per pharmacy dosing ordered for treatment of abscess  Blood cultures ordered  Goal of Therapy:  Vancomycin trough level 15-20 mcg/ml  Plan:  Measure antibiotic drug levels at steady state Follow up culture results  Zosyn per MD dosing Vancomycin 1gm IV q8h  Dannetta Lekas, Toribio Harbour, PharmD 11/17/2014,1:45 AM

## 2014-11-17 NOTE — Anesthesia Postprocedure Evaluation (Signed)
  Anesthesia Post-op Note  Patient: Christopher Roth  Procedure(s) Performed: Procedure(s): debridement of right perianal and groin wound (Right)  Patient Location: PACU  Anesthesia Type:General  Level of Consciousness: awake, alert , oriented and patient cooperative  Airway and Oxygen Therapy: Patient Spontanous Breathing  Post-op Pain: mild  Post-op Assessment: Post-op Vital signs reviewed, Patient's Cardiovascular Status Stable, Respiratory Function Stable, Patent Airway, No signs of Nausea or vomiting and Pain level controlled              Post-op Vital Signs: Reviewed and stable  Last Vitals:  Filed Vitals:   11/17/14 1423  BP: 148/77  Pulse: 70  Temp: 37.2 C  Resp: 15    Complications: No apparent anesthesia complications

## 2014-11-17 NOTE — Transfer of Care (Signed)
Immediate Anesthesia Transfer of Care Note  Patient: Christopher Roth  Procedure(s) Performed: Procedure(s): debridement of right perianal and groin wound (Right)  Patient Location: PACU  Anesthesia Type:General  Level of Consciousness: Patient easily awoken, sedated, comfortable, cooperative, following commands, responds to stimulation.   Airway & Oxygen Therapy: Patient spontaneously breathing, ventilating well, oxygen via simple oxygen mask.  Post-op Assessment: Report given to PACU RN, vital signs reviewed and stable, moving all extremities.   Post vital signs: Reviewed and stable.  Complications: No apparent anesthesia complications

## 2014-11-17 NOTE — Anesthesia Procedure Notes (Signed)
Procedure Name: Intubation Date/Time: 11/17/2014 1:07 PM Performed by: Deliah Boston Pre-anesthesia Checklist: Patient identified, Emergency Drugs available, Suction available and Patient being monitored Patient Re-evaluated:Patient Re-evaluated prior to inductionOxygen Delivery Method: Circle System Utilized Preoxygenation: Pre-oxygenation with 100% oxygen Intubation Type: IV induction Ventilation: Mask ventilation without difficulty Laryngoscope Size: Mac and 4 Grade View: Grade II Tube type: Oral Tube size: 7.5 mm Number of attempts: 3 Airway Equipment and Method: Oral airway and Bougie stylet Placement Confirmation: ETT inserted through vocal cords under direct vision,  positive ETCO2 and breath sounds checked- equal and bilateral Secured at: 22 cm Tube secured with: Tape Dental Injury: Teeth and Oropharynx as per pre-operative assessment  Comments: DVL x 2 by CRNA  With MAC 4 blade, grade 3 view, DVL x 1 by MDA with MAC 4 grade 2 view, bougie stylet used. Difficulty due to loose incisor.

## 2014-11-17 NOTE — Progress Notes (Signed)
Inpatient Diabetes Program Recommendations  AACE/ADA: New Consensus Statement on Inpatient Glycemic Control (2013)  Target Ranges:  Prepandial:   less than 140 mg/dL      Peak postprandial:   less than 180 mg/dL (1-2 hours)      Critically ill patients:  140 - 180 mg/dL   Reason for Visit: Hyperglycemia  Diabetes history: DM2 Outpatient Diabetes medications: None - stopped taking meds d/t lack of finances Current orders for Inpatient glycemic control: Novolog resistant Q4H  Results for Christopher, Roth (MRN [id number]) as of 11/17/2014 09:08  Ref. Range 11/16/2014 15:13 11/17/2014 01:50 11/17/2014 05:02 11/17/2014 07:46  Glucose-Capillary Latest Ref Range: 65-99 mg/dL 269 (H) 183 (H) 117 (H) 130 (H)  Results for Christopher, Roth (MRN [id number]) as of 11/17/2014 09:08  Ref. Range 11/16/2014 15:43 11/17/2014 04:50  Glucose Latest Ref Range: 65-99 mg/dL 303 (H) 132 (H)   Needs to have tight control for healing. HgbA1C results are pending. BMET Glucose on admission - 303.  Inpatient Diabetes Program Recommendations Insulin - Basal: If FBS > 180 mg/dL, add Lantus 15 units QHS HgbA1C: Pending Diet: When diet advanced, CHO mod med  Note: Will continue to follow. Will need assistance with obtaining PCP to manage DM Will need to go home on affordable meds.  Thank you. Lorenda Peck, RD, LDN, CDE Inpatient Diabetes Coordinator [phone]

## 2014-11-17 NOTE — Op Note (Signed)
Surgeon: Kaylyn Lim, MD, FACS  Asst:  none  Anes:  general  Procedure: Debridement of right groin abscess  Diagnosis: Right groin abscess  Complications: none  EBL:   20 cc  Drains: None but but betadine Kerlex in place  Description of Procedure:  The patient was taken to OR 2 at [facility].  After anesthesia was administered and the patient was prepped a timeout was performed.  With the patient in the dorsal lithotomy position, the right groin was debrided with the Bovie.  Some gray necrotic fat was removed with the overlying compromised skin.  The Bovie was used to control bleeding.  The wound was packed with betadine soaked Kerlex.    The patient tolerated the procedure well and was taken to the PACU in stable condition.     Matt B. Hassell Done, [place], [facility] Surgery, [place]

## 2014-11-18 LAB — CBC
HCT: 27.5 % — ABNORMAL LOW (ref 39.0–52.0)
Hemoglobin: 9.3 g/dL — ABNORMAL LOW (ref 13.0–17.0)
MCH: 28.6 pg (ref 26.0–34.0)
MCHC: 33.8 g/dL (ref 30.0–36.0)
MCV: 84.6 fL (ref 78.0–100.0)
Platelets: 296 10*3/uL (ref 150–400)
RBC: 3.25 MIL/uL — ABNORMAL LOW (ref 4.22–5.81)
RDW: 13.2 % (ref 11.5–15.5)
WBC: 15.9 10*3/uL — ABNORMAL HIGH (ref 4.0–10.5)

## 2014-11-18 LAB — GLUCOSE, CAPILLARY
GLUCOSE-CAPILLARY: 135 mg/dL — AB (ref 65–99)
GLUCOSE-CAPILLARY: 149 mg/dL — AB (ref 65–99)
Glucose-Capillary: 119 mg/dL — ABNORMAL HIGH (ref 65–99)
Glucose-Capillary: 128 mg/dL — ABNORMAL HIGH (ref 65–99)
Glucose-Capillary: 141 mg/dL — ABNORMAL HIGH (ref 65–99)
Glucose-Capillary: 180 mg/dL — ABNORMAL HIGH (ref 65–99)
Glucose-Capillary: 182 mg/dL — ABNORMAL HIGH (ref 65–99)

## 2014-11-18 LAB — BASIC METABOLIC PANEL
ANION GAP: 5 (ref 5–15)
BUN: 6 mg/dL (ref 6–20)
CHLORIDE: 105 mmol/L (ref 101–111)
CO2: 25 mmol/L (ref 22–32)
Calcium: 8.2 mg/dL — ABNORMAL LOW (ref 8.9–10.3)
Creatinine, Ser: 0.76 mg/dL (ref 0.61–1.24)
Glucose, Bld: 157 mg/dL — ABNORMAL HIGH (ref 65–99)
Potassium: 2.9 mmol/L — ABNORMAL LOW (ref 3.5–5.1)
SODIUM: 135 mmol/L (ref 135–145)

## 2014-11-18 LAB — VANCOMYCIN, TROUGH: Vancomycin Tr: 12 ug/mL (ref 10.0–20.0)

## 2014-11-18 LAB — HEMOGLOBIN A1C
Hgb A1c MFr Bld: 12.3 % — ABNORMAL HIGH (ref 4.8–5.6)
MEAN PLASMA GLUCOSE: 306 mg/dL

## 2014-11-18 NOTE — Progress Notes (Signed)
*  PT HYDROTHERAPY EVALUATION*  11/18/14 1100  Subjective Assessment  Patient and Family Stated Goals wound healing  Date of Onset 11/17/14  Prior Treatments I and D  Evaluation and Treatment  Evaluation and Treatment Procedures Explained to Patient/Family Yes  Evaluation and Treatment Procedures agreed to  Wound / Incision (Open or Dehisced) 11/18/14 Incision - Open Groin Right PT ONLY---> open incision  s/p I&D  Date First Assessed/Time First Assessed: 11/18/14 1124   Wound Type: Incision - Open  Location: Groin  Location Orientation: Right  Wound Description (Comments): PT ONLY---> open incision  s/p I&D  Present on Admission: (c) Yes  Dressing Type ABD;Moist to dry;Mesh briefs  Dressing Changed Changed  Site / Wound Assessment Granulation tissue;Black;Painful  % Wound base Red or Granulating 50%  % Wound base Yellow 35%  % Wound base Other (Comment) 15% (black-cauterized vessels)  Peri-wound Assessment Intact;Edema  Wound Length (cm) 8 cm  Wound Width (cm) 2 cm  Wound Depth (cm) 5.3 cm (to 2.0 lower wound)  Tunneling (cm) 5.3 at 12:00  Margins Unattached edges (unapproximated)  Closure None  Drainage Amount Minimal  Drainage Description Purulent;Odor;Serosanguineous  Non-staged Wound Description Not applicable  Treatment Cleansed;Hydrotherapy (Pulse lavage);Packing (Saline gauze)  Hydrotherapy  Pulsed lavage therapy - wound location R groin/perineum  Pulsed Lavage with Suction (psi) 8 psi  Pulsed Lavage with Suction - Normal Saline Used 1000 mL  Pulsed Lavage Tip Tip with splash shield  Wound Therapy - Assess/Plan/Recommendations  Wound Therapy - Clinical Statement pt will benefit from hydrotherapy to facilitate wound healing  Factors Delaying/Impairing Wound Healing Infection - systemic/local;Other (comment) (wound at risk for being contaminated with stool)  Hydrotherapy Plan Dressing change;Debridement;Patient/family education;Pulsatile lavage with suction  Wound  Therapy - Frequency 6X / week  Wound Plan hydro 6x/wk  Wound Therapy Goals - Improve the function of patient's integumentary system by progressing the wound(s) through the phases of wound healing by:  Decrease Necrotic Tissue to 25  Decrease Necrotic Tissue - Progress Goal set today  Increase Granulation Tissue to 65  Increase Granulation Tissue - Progress Goal set today  Decrease Length/Width/Depth by (cm) 0/0/.2  Decrease Length/Width/Depth - Progress Goal set today  Patient/Family will be able to  verbalize dressing changes  Patient/Family Instruction Goal - Progress Goal set today

## 2014-11-18 NOTE — Progress Notes (Signed)
UR Completed. Asher Babilonia, RN, BSN.  [phone] 

## 2014-11-18 NOTE — Progress Notes (Signed)
1 Day Post-Op  Subjective: Complains of soreness but otherwise ok  Objective: Vital signs in last 24 hours: Temp:  [98.1 F (36.7 C)-99.1 F (37.3 C)] 99 F (37.2 C) (07/16 0443) Pulse Rate:  [67-84] 76 (07/16 0443) Resp:  [15-23] 18 (07/16 0443) BP: (107-149)/(62-91) 137/69 mmHg (07/16 0443) SpO2:  [92 %-98 %] 92 % (07/16 0443) Last BM Date: 11/16/14  Intake/Output from previous day: 07/15 0701 - 07/16 0700 In: 3754.2 [I.V.:3454.2; IV Piggyback:300] Out: 0  Intake/Output this shift:    Resp: clear to auscultation bilaterally Cardio: regular rate and rhythm GI: soft, non-tender; bowel sounds normal; no masses,  no organomegaly Rectal: moderate drainage. edge of wound looks relatively clean  Lab Results:   Recent Labs  11/17/14 1600 11/18/14 0540  WBC 17.8* 15.9*  HGB 10.2* 9.3*  HCT 30.0* 27.5*  PLT 280 296   BMET  Recent Labs  11/17/14 0450 11/17/14 1600 11/18/14 0540  NA 138  --  135  K 3.2*  --  2.9*  CL 107  --  105  CO2 26  --  25  GLUCOSE 132*  --  157*  BUN 6  --  6  CREATININE 0.91 0.78 0.76  CALCIUM 8.1*  --  8.2*   PT/INR No results for input(s): LABPROT, INR in the last 72 hours. ABG No results for input(s): PHART, HCO3 in the last 72 hours.  Invalid input(s): PCO2, PO2  Studies/Results: Ct Abdomen Pelvis W Contrast  11/16/2014   ADDENDUM REPORT: 11/16/2014 22:57  ADDENDUM: Additional images are obtained and addended to the previous study. Images of the perineum inferior to the field of view of prior study are obtained.  Additional images demonstrate infiltration in the subcutaneous fat of the right groin and right perineal region extending to the medial aspect of the upper thigh and to the right side of the scrotum. Soft tissue gas collections are demonstrated in the peroneal tissues and medial right thigh region consistent with cellulitis due to infection with a gas-forming organism. No discrete fluid collection is identified to suggest a  discrete abscess. Enlarged lymph nodes in the groin regions are likely reactive.  IMPRESSION: Cellulitis in the right groin, right peroneal region, and upper medial thigh extending to the scrotum with gas collections in the perineal fat consistent with infection with gas-forming organism.   Electronically Signed   By: Lucienne Capers M.D.   On: 11/16/2014 22:57   11/16/2014   CLINICAL DATA:  Patient was sent from primary care physician today for perirectal abscess for 1.5 weeks. Drainage is noted.  EXAM: CT ABDOMEN AND PELVIS WITH CONTRAST  TECHNIQUE: Multidetector CT imaging of the abdomen and pelvis was performed using the standard protocol following bolus administration of intravenous contrast.  CONTRAST:  158m OMNIPAQUE IOHEXOL 300 MG/ML  SOLN  COMPARISON:  None.  FINDINGS: Atelectasis in the lung bases. The liver, spleen, gallbladder, pancreas, adrenal glands, inferior vena cava, and retroperitoneal lymph nodes are unremarkable. Calcification of the abdominal aorta without aneurysm. There is hydronephrosis with mild hydroureter on the left. No obstructing stone or mass is identified. This could indicate obstruction due to a stricture or non radiopaque stone or it may be due to reflux. Nephrograms are symmetrical. Right kidney in ureter are unremarkable. Stomach is decompressed. Small bowel and colon are not abnormally distended. A few fluid-filled bowel loops are present in the left lower quadrant without distention or wall thickening. This is likely due to normal peristalsis. No free air or free fluid  in the abdomen.  Pelvis: The appendix is normal. Prostate gland is mildly enlarged. Bladder wall is not thickened although the bladder is mildly distended no free or loculated pelvic fluid collections. Moderately prominent lymph nodes in the groin regions bilaterally, largest measuring 16 mm short axis dimension. This is nonspecific but could indicate reactive nodes. There is suggestion of mild infiltration in  the perineal fat anteriorly to the right, extending to the right groin. This may represent inflammatory change. No perirectal abscess or infiltration is demonstrated. Mild degenerative changes the spine. No destructive bone lesions.  IMPRESSION: No perirectal abscess is demonstrated and field of view. There is mild infiltration in the anterior peroneal and right groin fat with enlarged groin lymph nodes which may indicate inflammatory reaction or cellulitis. Nonspecific appearance of left renal hydronephrosis without cause of obstruction.  Electronically Signed: By: Lucienne Capers M.D. On: 11/16/2014 21:16    Anti-infectives: Anti-infectives    Start     Dose/Rate Route Frequency Ordered Stop   11/17/14 0800  vancomycin (VANCOCIN) IVPB 1000 mg/200 mL premix     1,000 mg 200 mL/hr over 60 Minutes Intravenous Every 8 hours 11/17/14 0145     11/17/14 0200  piperacillin-tazobactam (ZOSYN) IVPB 3.375 g     3.375 g 12.5 mL/hr over 240 Minutes Intravenous Every 8 hours 11/17/14 0046     11/17/14 0030  piperacillin-tazobactam (ZOSYN) IVPB 3.375 g  Status:  Discontinued     3.375 g 12.5 mL/hr over 240 Minutes Intravenous 3 times per day 11/17/14 0029 11/17/14 0046   11/16/14 2315  vancomycin (VANCOCIN) IVPB 1000 mg/200 mL premix     1,000 mg 200 mL/hr over 60 Minutes Intravenous  Once 11/16/14 2300 11/17/14 0050   11/16/14 1815  piperacillin-tazobactam (ZOSYN) IVPB 3.375 g     3.375 g 12.5 mL/hr over 240 Minutes Intravenous  Once 11/16/14 1807 11/16/14 2236      Assessment/Plan: s/p Procedure(s): debridement of right perianal and groin wound (Right) Advance diet  Continue vanc and zosyn Start dressing changes today  LOS: 1 day    TOTH III,Robeline Paone S 11/18/2014

## 2014-11-18 NOTE — Progress Notes (Signed)
ANTIBIOTIC CONSULT NOTE - follow up  Pharmacy Consult for Vancomycin Indication: Perirectal abscess  No Known Allergies  Patient Measurements: Height: 5' 8"  (172.7 cm) Weight: 211 lb 13.8 oz (96.1 kg) IBW/kg (Calculated) : 68.4  Vital Signs: Temp: 99 F (37.2 C) (07/16 0443) Temp Source: Oral (07/16 0443) BP: 137/69 mmHg (07/16 0443) Pulse Rate: 76 (07/16 0443) Intake/Output from previous day: 07/15 0701 - 07/16 0700 In: 3754.2 [I.V.:3454.2; IV Piggyback:300] Out: 0  Intake/Output from this shift:    Labs:  Recent Labs  11/17/14 0450 11/17/14 1600 11/18/14 0540  WBC 17.3* 17.8* 15.9*  HGB 10.6* 10.2* 9.3*  PLT 279 280 296  CREATININE 0.91 0.78 0.76   Estimated Creatinine Clearance: 133.9 mL/min (by C-G formula based on Cr of 0.76).  Recent Labs  11/18/14 0716  [facility] 12     Microbiology: Recent Results (from the past 720 hour(s))  Blood culture (routine x 2)     Status: None (Preliminary result)   Collection Time: 11/16/14  6:08 PM  Result Value Ref Range Status   Specimen Description BLOOD LEFT ANTECUBITAL  Final   Special Requests BOTTLES DRAWN AEROBIC AND ANAEROBIC 3ML  Final   Culture   Final    NO GROWTH < 24 HOURS Performed at [facility]    Report Status PENDING  Incomplete  Blood culture (routine x 2)     Status: None (Preliminary result)   Collection Time: 11/16/14  7:06 PM  Result Value Ref Range Status   Specimen Description BLOOD BLOOD RIGHT FOREARM  Final   Special Requests BOTTLES DRAWN AEROBIC AND ANAEROBIC 4CC  Final   Culture   Final    NO GROWTH < 24 HOURS Performed at [facility]    Report Status PENDING  Incomplete  Surgical pcr screen     Status: None   Collection Time: 11/17/14  8:14 AM  Result Value Ref Range Status   MRSA, PCR NEGATIVE NEGATIVE Final   Staphylococcus aureus NEGATIVE NEGATIVE Final    Comment:        The Xpert SA Assay (FDA approved for NASAL specimens in patients over 21 years of  age), is one component of a comprehensive surveillance program.  Test performance has been validated by [facility] for patients greater than or equal to 44 year old. It is not intended to diagnose infection nor to guide or monitor treatment.     Medical History: Past Medical History  Diagnosis Date  . Diabetes mellitus without complication     Medications:  Scheduled:  . heparin  5,000 Units Subcutaneous 3 times per day  . insulin aspart  0-20 Units Subcutaneous 6 times per day  . pantoprazole (PROTONIX) IV  40 mg Intravenous QHS  . piperacillin-tazobactam (ZOSYN)  IV  3.375 g Intravenous Q8H  . vancomycin  1,000 mg Intravenous Q8H   Infusions:  . sodium chloride 125 mL/hr (11/17/14 2322)  . lactated ringers     Assessment:  44 yr male with noted poorly controlled diabetes presents with draining right perianal abscess  S/p debridement of right perianal and groin wound on 7/15  Zosyn q8h and Vancomycin per pharmacy dosing ordered for treatment of abscess  Blood cultures ordered  Today 11/18/2014 vanc trough = 12 (after 4th dose)  Goal of Therapy:  Vancomycin trough level 15-20 mcg/ml  Plan:  -Continue Vancomycin 1gm IV q8h, will expect vanc level to continue to increase -Zosyn per MD dosing -follow renal function and cultures -additional vanc trough  as needed  Dolly Rias RPh 11/18/2014, 9:23 AM Pager [phone]

## 2014-11-19 LAB — BASIC METABOLIC PANEL
ANION GAP: 8 (ref 5–15)
BUN: <5 mg/dL — ABNORMAL LOW (ref 6–20)
CHLORIDE: 106 mmol/L (ref 101–111)
CO2: 25 mmol/L (ref 22–32)
Calcium: 8.3 mg/dL — ABNORMAL LOW (ref 8.9–10.3)
Creatinine, Ser: 0.86 mg/dL (ref 0.61–1.24)
GLUCOSE: 116 mg/dL — AB (ref 65–99)
POTASSIUM: 3.1 mmol/L — AB (ref 3.5–5.1)
SODIUM: 139 mmol/L (ref 135–145)

## 2014-11-19 LAB — GLUCOSE, CAPILLARY
GLUCOSE-CAPILLARY: 111 mg/dL — AB (ref 65–99)
GLUCOSE-CAPILLARY: 101 mg/dL — AB (ref 65–99)
GLUCOSE-CAPILLARY: 204 mg/dL — AB (ref 65–99)
Glucose-Capillary: 117 mg/dL — ABNORMAL HIGH (ref 65–99)
Glucose-Capillary: 110 mg/dL — ABNORMAL HIGH (ref 65–99)

## 2014-11-19 LAB — CBC
HEMATOCRIT: 28.3 % — AB (ref 39.0–52.0)
Hemoglobin: 9.6 g/dL — ABNORMAL LOW (ref 13.0–17.0)
MCH: 29.3 pg (ref 26.0–34.0)
MCHC: 33.9 g/dL (ref 30.0–36.0)
MCV: 86.3 fL (ref 78.0–100.0)
Platelets: 375 10*3/uL (ref 150–400)
RBC: 3.28 MIL/uL — ABNORMAL LOW (ref 4.22–5.81)
RDW: 13.4 % (ref 11.5–15.5)
WBC: 12.1 10*3/uL — ABNORMAL HIGH (ref 4.0–10.5)

## 2014-11-19 MED ORDER — INSULIN ASPART 100 UNIT/ML ~~LOC~~ SOLN
0.0000 [IU] | Freq: Three times a day (TID) | SUBCUTANEOUS | Status: DC
  Administered 2014-11-19: 7 [IU] via SUBCUTANEOUS
  Administered 2014-11-20 (×2): 4 [IU] via SUBCUTANEOUS

## 2014-11-19 NOTE — Progress Notes (Signed)
2 Days Post-Op  Subjective: Complains of soreness and scrotum swelling but otherwise ok  Objective: Vital signs in last 24 hours: Temp:  [98.2 F (36.8 C)-100.3 F (37.9 C)] 98.7 F (37.1 C) (07/17 0625) Pulse Rate:  [65-81] 65 (07/17 0625) Resp:  [16-18] 16 (07/17 0625) BP: (136-160)/(68-95) 154/80 mmHg (07/17 0625) SpO2:  [94 %-97 %] 97 % (07/17 0625) Last BM Date:  (pta)  Intake/Output from previous day: 07/16 0701 - 07/17 0700 In: 3610 [P.O.:360; I.V.:3000; IV Piggyback:250] Out: -  Intake/Output this shift:    Resp: clear to auscultation bilaterally Cardio: regular rate and rhythm GI: soft, non-tender; bowel sounds normal; no masses,  no organomegaly Rectal: Wound stable  Lab Results:   Recent Labs  11/18/14 0540 11/19/14 0410  WBC 15.9* 12.1*  HGB 9.3* 9.6*  HCT 27.5* 28.3*  PLT 296 375   BMET  Recent Labs  11/18/14 0540 11/19/14 0410  NA 135 139  K 2.9* 3.1*  CL 105 106  CO2 25 25  GLUCOSE 157* 116*  BUN 6 <5*  CREATININE 0.76 0.86  CALCIUM 8.2* 8.3*   PT/INR No results for input(s): LABPROT, INR in the last 72 hours. ABG No results for input(s): PHART, HCO3 in the last 72 hours.  Invalid input(s): PCO2, PO2  Studies/Results: No results found.  Anti-infectives: Anti-infectives    Start     Dose/Rate Route Frequency Ordered Stop   11/17/14 0800  vancomycin (VANCOCIN) IVPB 1000 mg/200 mL premix     1,000 mg 200 mL/hr over 60 Minutes Intravenous Every 8 hours 11/17/14 0145     11/17/14 0200  piperacillin-tazobactam (ZOSYN) IVPB 3.375 g     3.375 g 12.5 mL/hr over 240 Minutes Intravenous Every 8 hours 11/17/14 0046     11/17/14 0030  piperacillin-tazobactam (ZOSYN) IVPB 3.375 g  Status:  Discontinued     3.375 g 12.5 mL/hr over 240 Minutes Intravenous 3 times per day 11/17/14 0029 11/17/14 0046   11/16/14 2315  vancomycin (VANCOCIN) IVPB 1000 mg/200 mL premix     1,000 mg 200 mL/hr over 60 Minutes Intravenous  Once 11/16/14 2300  11/17/14 0050   11/16/14 1815  piperacillin-tazobactam (ZOSYN) IVPB 3.375 g     3.375 g 12.5 mL/hr over 240 Minutes Intravenous  Once 11/16/14 1807 11/16/14 2236      Assessment/Plan: s/p Procedure(s): debridement of right perianal and groin wound (Right) Advance diet  Continue Vanc/Zosyn. WBC improving Continue hydrotherapy and dressing changes  LOS: 2 days    TOTH III,PAUL S 11/19/2014

## 2014-11-20 ENCOUNTER — Encounter (HOSPITAL_COMMUNITY): Payer: Self-pay | Admitting: Surgery

## 2014-11-20 DIAGNOSIS — E1165 Type 2 diabetes mellitus with hyperglycemia: Secondary | ICD-10-CM

## 2014-11-20 DIAGNOSIS — IMO0002 Reserved for concepts with insufficient information to code with codable children: Secondary | ICD-10-CM

## 2014-11-20 DIAGNOSIS — K611 Rectal abscess: Secondary | ICD-10-CM

## 2014-11-20 DIAGNOSIS — N493 Fournier gangrene: Secondary | ICD-10-CM

## 2014-11-20 LAB — COMPREHENSIVE METABOLIC PANEL
ALBUMIN: 2.8 g/dL — AB (ref 3.5–5.0)
ALT: 15 U/L — AB (ref 17–63)
AST: 15 U/L (ref 15–41)
Alkaline Phosphatase: 107 U/L (ref 38–126)
Anion gap: 7 (ref 5–15)
BILIRUBIN TOTAL: 0.7 mg/dL (ref 0.3–1.2)
BUN: 6 mg/dL (ref 6–20)
CO2: 25 mmol/L (ref 22–32)
Calcium: 8.4 mg/dL — ABNORMAL LOW (ref 8.9–10.3)
Chloride: 108 mmol/L (ref 101–111)
Creatinine, Ser: 0.92 mg/dL (ref 0.61–1.24)
Glucose, Bld: 183 mg/dL — ABNORMAL HIGH (ref 65–99)
Potassium: 3.2 mmol/L — ABNORMAL LOW (ref 3.5–5.1)
SODIUM: 140 mmol/L (ref 135–145)
TOTAL PROTEIN: 6.3 g/dL — AB (ref 6.5–8.1)

## 2014-11-20 LAB — CBC
HEMATOCRIT: 29.7 % — AB (ref 39.0–52.0)
Hemoglobin: 10 g/dL — ABNORMAL LOW (ref 13.0–17.0)
MCH: 28.8 pg (ref 26.0–34.0)
MCHC: 33.7 g/dL (ref 30.0–36.0)
MCV: 85.6 fL (ref 78.0–100.0)
Platelets: 373 10*3/uL (ref 150–400)
RBC: 3.47 MIL/uL — AB (ref 4.22–5.81)
RDW: 13.3 % (ref 11.5–15.5)
WBC: 10.6 10*3/uL — ABNORMAL HIGH (ref 4.0–10.5)

## 2014-11-20 LAB — GLUCOSE, CAPILLARY
GLUCOSE-CAPILLARY: 134 mg/dL — AB (ref 65–99)
Glucose-Capillary: 154 mg/dL — ABNORMAL HIGH (ref 65–99)
Glucose-Capillary: 102 mg/dL — ABNORMAL HIGH (ref 65–99)
Glucose-Capillary: 193 mg/dL — ABNORMAL HIGH (ref 65–99)

## 2014-11-20 MED ORDER — INSULIN GLARGINE 100 UNITS/ML SOLOSTAR PEN: 15.0000 [IU] | PEN_INJECTOR | Freq: Every day | SUBCUTANEOUS | Status: DC

## 2014-11-20 MED ORDER — AMOXICILLIN-POT CLAVULANATE 875-125 MG PO TABS: 1.0000 | ORAL_TABLET | Freq: Two times a day (BID) | ORAL | Status: DC

## 2014-11-20 MED ORDER — HYDRALAZINE HCL 10 MG PO TABS
10.0000 mg | ORAL_TABLET | Freq: Three times a day (TID) | ORAL | Status: DC
  Administered 2014-11-20 – 2014-11-21 (×2): 10 mg via ORAL
  Filled 2014-11-20 (×5): qty 1

## 2014-11-20 MED ORDER — AMOXICILLIN-POT CLAVULANATE 875-125 MG PO TABS
1.0000 | ORAL_TABLET | Freq: Two times a day (BID) | ORAL | Status: DC
  Administered 2014-11-20 – 2014-11-21 (×3): 1 via ORAL
  Filled 2014-11-20 (×4): qty 1

## 2014-11-20 MED ORDER — POTASSIUM CHLORIDE CRYS ER 20 MEQ PO TBCR
20.0000 meq | EXTENDED_RELEASE_TABLET | Freq: Three times a day (TID) | ORAL | Status: DC
  Administered 2014-11-20 – 2014-11-21 (×3): 20 meq via ORAL
  Filled 2014-11-20 (×5): qty 1

## 2014-11-20 MED ORDER — INSULIN GLARGINE 100 UNIT/ML ~~LOC~~ SOLN: 15.0000 [IU] | Freq: Every day | SUBCUTANEOUS | Status: DC

## 2014-11-20 MED ORDER — SACCHAROMYCES BOULARDII 250 MG PO CAPS
250.0000 mg | ORAL_CAPSULE | Freq: Two times a day (BID) | ORAL | Status: DC
  Administered 2014-11-20 – 2014-11-21 (×3): 250 mg via ORAL
  Filled 2014-11-20 (×4): qty 1

## 2014-11-20 MED ORDER — OXYCODONE-ACETAMINOPHEN 5-325 MG PO TABS
2.0000 | ORAL_TABLET | ORAL | Status: DC | PRN
  Administered 2014-11-20 (×2): 2 via ORAL
  Filled 2014-11-20 (×2): qty 2

## 2014-11-20 MED ORDER — NICOTINE 14 MG/24HR TD PT24
14.0000 mg | MEDICATED_PATCH | Freq: Every day | TRANSDERMAL | Status: DC
  Administered 2014-11-20 – 2014-11-21 (×2): 14 mg via TRANSDERMAL
  Filled 2014-11-20 (×2): qty 1

## 2014-11-20 MED ORDER — INSULIN GLARGINE 100 UNIT/ML ~~LOC~~ SOLN
15.0000 [IU] | Freq: Every day | SUBCUTANEOUS | Status: DC
  Administered 2014-11-20: 15 [IU] via SUBCUTANEOUS
  Filled 2014-11-20: qty 0.15

## 2014-11-20 MED ORDER — SACCHAROMYCES BOULARDII 250 MG PO CAPS: ORAL_CAPSULE | ORAL | Status: DC

## 2014-11-20 MED ORDER — OXYCODONE-ACETAMINOPHEN 5-325 MG PO TABS: 1.0000 | ORAL_TABLET | ORAL | Status: DC | PRN

## 2014-11-20 NOTE — Consult Note (Signed)
[facility] Medical Consultation  Ekam Besson ZOX:[id number] DOB: August 28, 1970 DOA: 11/16/2014 PCP: Cathlean Cower, MD   Requesting physician: Dr. Autumn Messing (surgery)  Date of consultation: 11/20/2014  Reason for consultation: uncontrolled diabetes   Impression/Recommendations Active Problems:   Perirectal abscess   Diabetes mellitus, uncontrolled  Chief Complaint: abscess, uncontrolled diabetes   HPI:  44 year old male with past medical history of diabetes who presented to [facility] ED with what was initially a lump in the right perianal area that has grown towards the scrotum and started to drain. CT scan on admission demonstrated a complex right perirectal abscess with gas present. Pt underwent debridement of the right groin abscess on 11/17/2014 and initially on vanco and zosyn but now per surgery switched to Augmentin. [facility] consulted for management of uncontrolled diabetes and hypertension.  Assessment and Plan:  Right groin abscess / Leukocytosis  - Status post debridement 11/17/2014 - Initially on vanco and zosyn but changed to Augmentin today - Blood cultures so far negative - Management per surgery  Diabetes mellitus, uncontrolled without mention of complications - W1X 91.4 on this admission indicating poor glycemic control - He was seen by DM coordinator - Recommended Lantus 15 units at bedtime along with SSI - Depending on what CBG's are in next 24 hours will see if he needs novolog coverage as well - Pt agreeable to continue insulin on discharge.   Essential hypertension - Started hydralazine 10 mg every 8 hours   DVT prophylaxis - Heparin subQ   - will follow with you again tomorrow - thank you  Leisa Lenz [facility] [phone] or [phone]   Review of Systems:  Constitutional: Negative for fever, chills, diaphoresis, activity change, appetite change and fatigue.  HENT: Negative for ear pain, nosebleeds, congestion, facial swelling, rhinorrhea, neck pain, neck  stiffness and ear discharge.   Eyes: Negative for pain, discharge, redness, itching and visual disturbance.  Respiratory: Negative for cough, choking, chest tightness, shortness of breath, wheezing and stridor.   Cardiovascular: Negative for chest pain, palpitations and leg swelling.  Gastrointestinal: Negative for abdominal distention.  Genitourinary: Negative for dysuria, urgency, frequency, hematuria, flank pain, decreased urine volume, difficulty urinating and dyspareunia.  Musculoskeletal: Negative for back pain, joint swelling, arthralgias and gait problem.  Neurological: Negative for dizziness, tremors, seizures, syncope, facial asymmetry, speech difficulty, weakness, light-headedness, numbness and headaches.  Hematological: Negative for adenopathy. Does not bruise/bleed easily.  Psychiatric/Behavioral: Negative for hallucinations, behavioral problems, confusion, dysphoric mood, decreased concentration and agitation.     Past Medical History  Diagnosis Date  . Diabetes mellitus without complication    History reviewed. No pertinent past surgical history. Social History:  reports that he has been smoking Cigarettes.  He has a 24 pack-year smoking history. He does not have any smokeless tobacco history on file. He reports that he drinks alcohol. He reports that he does not use illicit drugs.  No Known Allergies  Family history: hypertension in parents   Prior to Admission medications   Medication Sig Start Date End Date Taking? Authorizing Provider  amoxicillin (AMOXIL) 500 MG tablet Take 500 mg by mouth 4 (four) times daily.   Yes Historical Provider, MD  ibuprofen (ADVIL,MOTRIN) 200 MG tablet Take 600 mg by mouth every 6 (six) hours as needed for headache, mild pain or moderate pain.   Yes Historical Provider, MD   Physical Exam: Blood pressure 172/77, pulse 61, temperature 98.2 F (36.8 C), temperature source Oral, resp. rate 18, height 5' 8"  (1.727 m), weight 96.1 kg (211  lb  13.8 oz), SpO2 93 %. Filed Vitals:   11/20/14 0503  BP: 172/77  Pulse: 61  Temp: 98.2 F (36.8 C)  Resp: 18    Physical Exam  Constitutional: Appears well-developed and well-nourished. No distress.  HENT: Normocephalic. External right and left ear normal. Oropharynx is clear and moist.  Eyes: Conjunctivae and EOM are normal. PERRLA, no scleral icterus.  Neck: Normal ROM. Neck supple. No JVD. No tracheal deviation. No thyromegaly.  CVS: RRR, S1/S2 +, no murmurs, no gallops, no carotid bruit.  Pulmonary: Effort and breath sounds normal, no stridor, rhonchi, wheezes, rales.  Abdominal: Soft. BS +,  no distension, tenderness, rebound or guarding.  Musculoskeletal: Normal range of motion. No edema and no tenderness.  Lymphadenopathy: No lymphadenopathy noted, cervical, inguinal. Neuro: Alert. Normal reflexes, muscle tone coordination. No cranial nerve deficit. Skin: Skin is warm and dry. No rash noted. Not diaphoretic. No erythema. No pallor.  Psychiatric: Normal mood and affect. Behavior, judgment, thought content normal.   Labs on Admission:  Basic Metabolic Panel:  Recent Labs Lab 11/16/14 1543 11/17/14 0450 11/17/14 1600 11/18/14 0540 11/19/14 0410 11/20/14 1015  NA 130* 138  --  135 139 140  K 3.6 3.2*  --  2.9* 3.1* 3.2*  CL 95* 107  --  105 106 108  CO2 27 26  --  25 25 25   GLUCOSE 303* 132*  --  157* 116* 183*  BUN 7 6  --  6 <5* 6  CREATININE 0.92 0.91 0.78 0.76 0.86 0.92  CALCIUM 9.2 8.1*  --  8.2* 8.3* 8.4*   Liver Function Tests:  Recent Labs Lab 11/16/14 1543 11/20/14 1015  AST 19 15  ALT 23 15*  ALKPHOS 120 107  BILITOT 1.4* 0.7  PROT 7.8 6.3*  ALBUMIN 3.8 2.8*   No results for input(s): LIPASE, AMYLASE in the last 168 hours. No results for input(s): AMMONIA in the last 168 hours. CBC:  Recent Labs Lab 11/16/14 1543 11/17/14 0450 11/17/14 1600 11/18/14 0540 11/19/14 0410 11/20/14 1015  WBC 21.8* 17.3* 17.8* 15.9* 12.1* 10.6*  NEUTROABS  18.6*  --   --   --   --   --   HGB 12.2* 10.6* 10.2* 9.3* 9.6* 10.0*  HCT 35.0* 31.0* 30.0* 27.5* 28.3* 29.7*  MCV 85.6 84.9 86.7 84.6 86.3 85.6  PLT 340 279 280 296 375 373   Cardiac Enzymes: No results for input(s): CKTOTAL, CKMB, CKMBINDEX, TROPONINI in the last 168 hours. BNP: Invalid input(s): POCBNP CBG:  Recent Labs Lab 11/19/14 0756 11/19/14 1219 11/19/14 1607 11/19/14 2205 11/20/14 0909  GLUCAP 117* 110* 101* 204* 134*    Radiological Exams on Admission: No results found.   Time spent: 37 minutes  Leisa Lenz [facility] Pager [phone]  If 7PM-7AM, please contact night-coverage [email or web address] Password [facility] 11/20/2014, 11:38 AM

## 2014-11-20 NOTE — Discharge Summary (Signed)
Physician Discharge Summary  Patient ID: Christopher Roth MRN: [id number] DOB/AGE: Sep 14, 1970 44 y.o.  Admit date: 11/16/2014 Discharge date: 11/21/2014  Admission Diagnoses:  Complex perirectal abscess AODM uncontrolled Tobacco use ongoing   Discharge Diagnoses:  Right groin abscess Diabetes type II, Uncontrolled Ongoing tobacco use. Hypertension  Body mass index is 32.22 kg/(m^2).  Principal Problem:   Perirectal abscess Active Problems:   Diabetes mellitus type II, uncontrolled   Essential hypertension   Tobacco use disorder   PROCEDURES: Bedside I&D 11/17/14, Dr. Zella Richer Debridement of right groin abscess, 8 x 5 x 2 cm; with general anesthesia 11/17/14 Dr. Johnathan Hausen.  Hospital Course: This is a 44 year old male with diabetes who is not been able to afford his medications for the past 3-4 months. About 7-10 days ago, he noticed a painful lump in the right perianal area that has slowly grown up toward the scrotum. It has started to do drain as well. He states his bowel and bladder habits have not been any different. He denies chills but has had some fever. He initially was seen at urgent care with end was instructed to come to the emergency room. CT scan demonstrated a complex right perirectal abscess with gas present. He actually was on some amoxicillin for toothache for the past several days but this has not improved his condition.  He had a bedside I&D at admit early AM by Dr. Zella Richer, and then was taken to the OR the following day with more extensive debridement as described above.  We had Medicine see him along with Diabetic Coordinator who got him on Insulin.  We have started nicotine replacement therapy and we placed him on hydrotherapy for the wound along with wet to dry dressing changes BID, and PRN.  He is on hydralazine for his blood pressure.  He is planning to go back to a former PCP for medical management of his diabetes, blood pressure and tobacco use.  We got  him started on nicotine patches here, along with discussions about long term primary care to prevent re- occurance of this issue and general medical treatment for diabetes, and hypertension.   He will return to our clinic for follow up of his wound.  We ask Medicine to assist in his care and Dr. Charlies Silvers has adjusted his BP medicine and started him on Insulin.  She assisted with the discharge medicines.  We have instructed him to shower and use hand held shower head to irrigate site, and then repack wet to dry till it heals.  Condition on d/c:  Improved   Disposition: Final discharge disposition not confirmed     Medication List    STOP taking these medications        amoxicillin 500 MG tablet  Commonly known as:  AMOXIL     ibuprofen 200 MG tablet  Commonly known as:  ADVIL,MOTRIN      TAKE these medications        Alcohol Swabs Pads  1 Package by Does not apply route as needed (with blood glucose checks).     amoxicillin-clavulanate 875-125 MG per tablet  Commonly known as:  AUGMENTIN  Take 1 tablet by mouth every 12 (twelve) hours.     freestyle lancets  Use as instructed     glucose blood test strip  Commonly known as:  CHOICE DM FORA G20 TEST STRIPS  Use as instructed     glucose monitoring kit monitoring kit  1 each by Does not apply route as needed for  other.     hydrALAZINE 25 MG tablet  Commonly known as:  APRESOLINE  Take 1 tablet (25 mg total) by mouth every 12 (twelve) hours.     insulin aspart 100 UNIT/ML FlexPen  Commonly known as:  NOVOLOG  Please use scale below to see how much insulin to use depending on you checked blood sugar level: CBG 121 - 150:3 units CBG 151 - 200:4 units CBG 201 - 250:7 units CBG 251 - 300:11 units CBG 301 - 350:15 units CBG 351 - 400:20 units     Insulin Detemir 100 UNIT/ML Pen  Commonly known as:  LEVEMIR FLEXPEN  Inject 15 Units into the skin daily at 10 pm.     nicotine 14 mg/24hr patch  Commonly known as:   NICODERM CQ - dosed in mg/24 hours  You can buy this over the counter, next dose is 7 mg. Follow their instructions.     oxyCODONE-acetaminophen 5-325 MG per tablet  Commonly known as:  PERCOCET/ROXICET  Take 1-2 tablets by mouth every 4 (four) hours as needed for moderate pain.     saccharomyces boulardii 250 MG capsule  Commonly known as:  FLORASTOR  This probiotic is to protect you from colitis, use for at least 1 week after you finish antibiotics.  You can buy at almost any drug store over the counter.  It comes in generic forms.       Follow-up Information    Follow up with [facility]. Schedule an appointment as soon as possible for a visit on 12/05/2014.   Why:  You have an appointment at 1:45 on 12/05/14. You are double booked so it may be slow.  Be at the office at 1:15 to check in.   Contact information:   [place] [id number] [phone]      Schedule an appointment as soon as possible for a visit with Cathlean Cower, MD.   Specialties:  Internal Medicine, Radiology   Why:  for follow up on your blood pressure and diabetes care.  If not with him, another primary care doctor of your choice today.   Contact information:   [place] [phone]       Signed: Earnstine Regal 11/21/2014, 11:16 AM

## 2014-11-20 NOTE — Progress Notes (Signed)
3 Days Post-Op  Subjective: Christopher Roth is getting a new IV placed.  Christopher Roth was on Metformin and Glipizide before Christopher Roth quit taking meds.   I will look at wound after IV is placed. Objective: Vital signs in last 24 hours: Temp:  [98.2 F (36.8 C)-98.6 F (37 C)] 98.2 F (36.8 C) (07/18 0503) Pulse Rate:  [61-69] 61 (07/18 0503) Resp:  [18] 18 (07/18 0503) BP: (156-172)/(77-85) 172/77 mmHg (07/18 0503) SpO2:  [93 %] 93 % (07/18 0503) Last BM Date: 11/20/14 PO not recorded Carb Modified diet Afebrile, VSS No labs  Intake/Output from previous day: 07/17 0701 - 07/18 0700 In: 3600 [I.V.:3500; IV Piggyback:100] Out: -  Intake/Output this shift:    General appearance: alert, cooperative and no distress Skin: open wound is soupy at the base, dressing removed, I will get him in the shower.    Lab Results:   Recent Labs  11/18/14 0540 11/19/14 0410  WBC 15.9* 12.1*  HGB 9.3* 9.6*  HCT 27.5* 28.3*  PLT 296 375    BMET  Recent Labs  11/18/14 0540 11/19/14 0410  NA 135 139  K 2.9* 3.1*  CL 105 106  CO2 25 25  GLUCOSE 157* 116*  BUN 6 <5*  CREATININE 0.76 0.86  CALCIUM 8.2* 8.3*   PT/INR No results for input(s): LABPROT, INR in the last 72 hours.   Recent Labs Lab 11/16/14 1543  AST 19  ALT 23  ALKPHOS 120  BILITOT 1.4*  PROT 7.8  ALBUMIN 3.8     Lipase  No results found for: LIPASE   Studies/Results: No results found.  Medications: . heparin  5,000 Units Subcutaneous 3 times per day  . insulin aspart  0-20 Units Subcutaneous TID WC & HS  . pantoprazole (PROTONIX) IV  40 mg Intravenous QHS  . piperacillin-tazobactam (ZOSYN)  IV  3.375 g Intravenous Q8H  . vancomycin  1,000 mg Intravenous Q8H   . sodium chloride 125 mL/hr at 11/19/14 1341   Prior to Admission medications   Medication Sig Start Date End Date Taking? Authorizing Provider  amoxicillin (AMOXIL) 500 MG tablet Take 500 mg by mouth 4 (four) times daily.   Yes Historical Provider, MD  ibuprofen  (ADVIL,MOTRIN) 200 MG tablet Take 600 mg by mouth every 6 (six) hours as needed for headache, mild pain or moderate pain.   Yes Historical Provider, MD     Assessment/Plan Right groin abscess Bedside I&D 11/17/14, Dr. Zella Richer Debridement of right groin abscess, with general anesthesia 11/17/14 Dr. Johnathan Hausen AODM - Hemoglobin A1C  12.3 Ongoing tobacco use Left mild hydronephrosis without obstruction Antibiotics:  Day 5 Zosyn/Vancomycin  No wound culture/Blood cultures No growth so far DVT:  SCD/Heparin   Plan:  I will get Medicine to see and get him back on Meds for his diabetes.  I cannot tell how well Christopher Roth is eating but glucose reading are not all that high.  Recheck labs and check wound.  Make plans for dc after that.  Christopher Roth is going to start on Lantus for his glucose. I will put him in the shower, wound is still a bit soupy, no cultures; swabs are negative for staph.  I will stop his Zosyn and Vancomycin and start him on some oral Augmentin.  Add probiotics also.    LOS: 3 days    Christopher Roth 11/20/2014

## 2014-11-20 NOTE — Progress Notes (Signed)
11/20/14 1423 HYDROTHERAPY TREATMENT  Subjective Assessment  Subjective It is painful, I think I need some medication.  Patient and Family Stated Goals wound healing  Date of Onset 11/17/14  Prior Treatments I and D  Evaluation and Treatment  Evaluation and Treatment Procedures Explained to Patient/Family Yes  Evaluation and Treatment Procedures agreed to  Wound / Incision (Open or Dehisced) 11/18/14 Incision - Open Groin Right PT ONLY---> open incision  s/p I&D  Date First Assessed/Time First Assessed: 11/18/14 1124   Wound Type: Incision - Open  Location: Groin  Location Orientation: Right  Wound Description (Comments): PT ONLY---> open incision  s/p I&D  Present on Admission: (c) Yes  Dressing Type ABD;Moist to dry;Mesh briefs  Dressing Changed New  Dressing Status Dry;Intact  Site / Wound Assessment Granulation tissue;Black;Painful  % Wound base Red or Granulating 50%  % Wound base Yellow 35% (slough on edges of wound)  % Wound base Other (Comment) 15% (black-cauterized vessels)  Peri-wound Assessment Intact;Edema  Wound Length (cm) 8 cm  Wound Width (cm) 2 cm  Wound Depth (cm) 5 cm  Tunneling (cm) 5.3 at 1200  Margins Unattached edges (unapproximated)  Closure None  Drainage Amount Minimal  Drainage Description Purulent;Odor;Serosanguineous  Non-staged Wound Description Not applicable  Treatment Cleansed;Debridement (Selective);Packing (Saline gauze) (briefs)  Hydrotherapy  Pulsed lavage therapy - wound location R groin/perineum  Pulsed Lavage with Suction (psi) 8 psi  Pulsed Lavage with Suction - Normal Saline Used (250- did not  do more today. )  Pulsed Lavage Tip Tip with splash shield  Selective Debridement  Selective Debridement - Location edges of wound  Selective Debridement - Tools Used Forceps;Scissors  Selective Debridement - Tissue Removed (slough)  Wound Therapy - Assess/Plan/Recommendations  Wound Therapy - Clinical Statement Patient tolerates well,  difficult to get diverter tip to seal due to oblong shape and tenderness to  apply pressure on wound edges. Wife present and instructed  in how to looslely pack wound deeper.  Factors Delaying/Impairing Wound Healing Diabetes Mellitus;Infection - systemic/local;Other (comment) (wound at risk for being contaminated with stool)  Hydrotherapy Plan Dressing change;Debridement;Patient/family education;Pulsatile lavage with suction  Wound Therapy - Frequency 6X / week  Wound Therapy - Follow Up Recommendations Home health RN  Wound Plan hydro 6x/wk  Wound Therapy Goals - Improve the function of patient's integumentary system by progressing the wound(s) through the phases of wound healing by:  Decrease Necrotic Tissue to 25  Decrease Necrotic Tissue - Progress Progressing toward goal  Increase Granulation Tissue to 65  Increase Granulation Tissue - Progress Progressing toward goal  Decrease Length/Width/Depth by (cm) 0/0/.2  Decrease Length/Width/Depth - Progress Progressing toward goal  Patient/Family will be able to  verbalize dressing changes  Patient/Family Instruction Goal - Progress Progressing toward goal  Goals/treatment plan/discharge plan were made with and agreed upon by patient/family Yes  Time For Goal Achievement 2 weeks  Wound Therapy - Potential for Goals Good     11/20/14 1423  Subjective Assessment  Subjective It is painful, I think I need some medication.  Patient and Family Stated Goals wound healing  Date of Onset 11/17/14  Prior Treatments I and D  Evaluation and Treatment  Evaluation and Treatment Procedures Explained to Patient/Family Yes  Evaluation and Treatment Procedures agreed to  Wound / Incision (Open or Dehisced) 11/18/14 Incision - Open Groin Right PT ONLY---> open incision  s/p I&D  Date First Assessed/Time First Assessed: 11/18/14 1124   Wound Type: Incision - Open  Location:  Groin  Location Orientation: Right  Wound Description (Comments): PT ONLY---> open  incision  s/p I&D  Present on Admission: (c) Yes  Dressing Type ABD;Moist to dry;Mesh briefs  Dressing Changed New  Dressing Status Dry;Intact  Site / Wound Assessment Granulation tissue;Black;Painful  % Wound base Red or Granulating 50%  % Wound base Yellow 35% (slough on edges of wound)  % Wound base Other (Comment) 15% (black-cauterized vessels)  Peri-wound Assessment Intact;Edema  Wound Length (cm) 8 cm  Wound Width (cm) 2 cm  Wound Depth (cm) 5 cm  Tunneling (cm) 5.3 at 1200  Margins Unattached edges (unapproximated)  Closure None  Drainage Amount Minimal  Drainage Description Purulent;Odor;Serosanguineous  Non-staged Wound Description Not applicable  Treatment Cleansed;Debridement (Selective);Packing (Saline gauze) (briefs)  Hydrotherapy  Pulsed lavage therapy - wound location R groin/perineum  Pulsed Lavage with Suction (psi) 8 psi  Pulsed Lavage with Suction - Normal Saline Used (250- did not  do more today. )  Pulsed Lavage Tip Tip with splash shield  Selective Debridement  Selective Debridement - Location edges of wound  Selective Debridement - Tools Used Forceps;Scissors  Selective Debridement - Tissue Removed (slough)  Wound Therapy - Assess/Plan/Recommendations  Wound Therapy - Clinical Statement Patient tolerates well, difficult to get diverter tip to seal due to oblong shape and tenderness to  apply pressure on wound edges. Wife present and instructed  in how to looslely pack wound deeper. Note order for patient to shower. Spoke to will [place], [place] who stated for PT to perform PLS and determine if PLS needs to continue. At this time, PT recommends daily PLS for the next several days after noting increased slough..  Factors Delaying/Impairing Wound Healing Diabetes Mellitus;Infection - systemic/local;Other (comment) (wound at risk for being contaminated with stool)  Hydrotherapy Plan Dressing change;Debridement;Patient/family education;Pulsatile lavage with suction   Wound Therapy - Frequency 6X / week  Wound Therapy - Follow Up Recommendations Home health RN  Wound Plan hydro 6x/wk  Wound Therapy Goals - Improve the function of patient's integumentary system by progressing the wound(s) through the phases of wound healing by:  Decrease Necrotic Tissue to 25  Decrease Necrotic Tissue - Progress Progressing toward goal  Increase Granulation Tissue to 75  Increase Granulation Tissue - Progress Progressing toward goal  Decrease Length/Width/Depth by (cm) 0/0/.2  Decrease Length/Width/Depth - Progress Progressing toward goal  Patient/Family will be able to  verbalize dressing changes  Patient/Family Instruction Goal - Progress Progressing toward goal  Goals/treatment plan/discharge plan were made with and agreed upon by patient/family Yes  Time For Goal Achievement 2 weeks  Wound Therapy - Potential for Goals Alfredo Martinez PT [phone]

## 2014-11-20 NOTE — Progress Notes (Signed)
Inpatient Diabetes Program Recommendations  AACE/ADA: New Consensus Statement on Inpatient Glycemic Control (2013)  Target Ranges:  Prepandial:   less than 140 mg/dL      Peak postprandial:   less than 180 mg/dL (1-2 hours)      Critically ill patients:  140 - 180 mg/dL   Reason for Visit: Elevated HgbA1C  Diabetes history: DM2 Outpatient Diabetes medications: None Current orders for Inpatient glycemic control: Novolog resistant tidwc and hs  Results for VIVIANO, BIR (MRN [id number]) as of 11/20/2014 12:42  Ref. Range 11/19/2014 04:00 11/19/2014 07:56 11/19/2014 12:19 11/19/2014 16:07 11/19/2014 22:05 11/20/2014 09:09 11/20/2014 11:51  Glucose-Capillary Latest Ref Range: 65-99 mg/dL 111 (H) 117 (H) 110 (H) 101 (H) 204 (H) 134 (H) 154 (H)  Results for JARRIN, STALEY (MRN [id number]) as of 11/20/2014 12:42  Ref. Range 11/17/2014 04:50  Hemoglobin A1C Latest Ref Range: 4.8-5.6 % 12.3 (H)   Needs tighter glucose control for healing. Discussed importance of f/u with PCP to manage DM.  Discussed HgbA1C of 12.3% and discussed goal of 7.0%. Has been to diabetes classes in the past.  Recommendations: Add Lantus 12 units QHS  Will need prescription for meter, strips, and lancets at discharge. Pt prefers to go home on insulin pen - please order Lantus Solostar and pen needles at discharge.  Discussed with PA. Thank you. Lorenda Peck, RD, LDN, CDE Inpatient Diabetes Coordinator [phone]

## 2014-11-21 ENCOUNTER — Encounter (HOSPITAL_COMMUNITY): Payer: Self-pay | Admitting: General Surgery

## 2014-11-21 DIAGNOSIS — I10 Essential (primary) hypertension: Secondary | ICD-10-CM

## 2014-11-21 DIAGNOSIS — F17200 Nicotine dependence, unspecified, uncomplicated: Secondary | ICD-10-CM | POA: Diagnosis present

## 2014-11-21 HISTORY — DX: Nicotine dependence, unspecified, uncomplicated: F17.200

## 2014-11-21 LAB — CULTURE, BLOOD (ROUTINE X 2)
Culture: NO GROWTH
Culture: NO GROWTH

## 2014-11-21 LAB — GLUCOSE, CAPILLARY: GLUCOSE-CAPILLARY: 98 mg/dL (ref 65–99)

## 2014-11-21 MED ORDER — FREESTYLE SYSTEM KIT: 1.0000 | PACK | Status: AC | PRN

## 2014-11-21 MED ORDER — ALCOHOL SWABS PADS: 1.0000 | MEDICATED_PAD | Status: AC | PRN

## 2014-11-21 MED ORDER — HYDRALAZINE HCL 25 MG PO TABS
25.0000 mg | ORAL_TABLET | Freq: Three times a day (TID) | ORAL | Status: DC
  Filled 2014-11-21 (×3): qty 1

## 2014-11-21 MED ORDER — INSULIN GLARGINE 100 UNIT/ML ~~LOC~~ SOLN: SUBCUTANEOUS | Status: DC

## 2014-11-21 MED ORDER — HYDRALAZINE HCL 25 MG PO TABS: 25.0000 mg | ORAL_TABLET | Freq: Two times a day (BID) | ORAL | Status: DC

## 2014-11-21 MED ORDER — INSULIN ASPART 100 UNIT/ML FLEXPEN: PEN_INJECTOR | SUBCUTANEOUS | Status: DC

## 2014-11-21 MED ORDER — HYDRALAZINE HCL 25 MG PO TABS: 25.0000 mg | ORAL_TABLET | Freq: Three times a day (TID) | ORAL | Status: DC

## 2014-11-21 MED ORDER — GLUCOSE BLOOD VI STRP: ORAL_STRIP | Status: AC

## 2014-11-21 MED ORDER — FREESTYLE SYSTEM KIT: 1.0000 | PACK | Status: DC | PRN

## 2014-11-21 MED ORDER — FREESTYLE LANCETS MISC: Status: AC

## 2014-11-21 MED ORDER — INSULIN DETEMIR 100 UNIT/ML FLEXPEN
15.0000 [IU] | PEN_INJECTOR | Freq: Every day | SUBCUTANEOUS | Status: DC
Start: 2014-11-21 — End: 2016-04-20

## 2014-11-21 MED ORDER — NICOTINE 14 MG/24HR TD PT24: MEDICATED_PATCH | TRANSDERMAL | Status: DC

## 2014-11-21 NOTE — Progress Notes (Signed)
Pt. Alert and oriented. Given discharge instructions and prescriptions. Patient verbalized understanding of all instructions and wound care. Spouse comfortable with dressing changes.

## 2014-11-21 NOTE — Care Management Note (Signed)
Case Management Note  Patient Details  Name: Christopher Roth MRN: [id number] Date of Birth: February 21, 1971  Subjective/Objective:     44 yo admitted with Perirectal abscess               Action/Plan: From home with wife  Expected Discharge Date:                  Expected Discharge Plan:  Home/Self Care  In-House Referral:     Discharge planning Services  CM Consult  Post Acute Care Choice:    Choice offered to:     DME Arranged:    DME Agency:     HH Arranged:    [place] Agency:     Status of Service:  Completed, signed off  Medicare Important Message Given:    Date Medicare IM Given:    Medicare IM give by:    Date Additional Medicare IM Given:    Additional Medicare Important Message give by:     If discussed at [place] of Stay Meetings, dates discussed:    Additional Comments: Per nursing staff pt's wife has been doing dressing changes for pt and has expressed that she feels comfortable doing so. No other dc needs noted. Lynnell Catalan, RN 11/21/2014, 11:57 AM

## 2014-11-21 NOTE — Progress Notes (Signed)
Adjusted medication for blood pressure control, hydralazine 25 mg twice daily Patient agreeable to continue Lantus on discharge. He will continue Lantus 15 units at bedtime along with sliding scale insulin.  Christopher Roth [facility] [phone]

## 2014-11-21 NOTE — Progress Notes (Signed)
4 Days Post-Op  Subjective: He is doing well.  Wound is clean, he still has some white necrotic stuff in the wound, but I showed his wife how to separate the open site and pack.  This will debride the site each time they do a dressing change.  Objective: Vital signs in last 24 hours: Temp:  [97.9 F (36.6 C)-98.4 F (36.9 C)] 97.9 F (36.6 C) (07/19 0549) Pulse Rate:  [63-71] 71 (07/19 0549) Resp:  [16-20] 20 (07/19 0549) BP: (167-201)/(76-86) 201/86 mmHg (07/19 0549) SpO2:  [93 %-100 %] 93 % (07/19 0549) Last BM Date: 11/20/14 120 PO recorded  Diet: carb Modified Afebrile, BP up some K+ 3.2 yesterday on supplement WBC down to 10.6 Intake/Output from previous day: 07/18 0701 - 07/19 0700 In: 120 [P.O.:120] Out: 400 [Urine:400] Intake/Output this shift:    General appearance: alert, cooperative and no distress Skin: open wound is clean, still has some white necrotic tissue that is easily seperated.  Should clear with dressing changes and packing.  Lab Results:   Recent Labs  11/19/14 0410 11/20/14 1015  WBC 12.1* 10.6*  HGB 9.6* 10.0*  HCT 28.3* 29.7*  PLT 375 373    BMET  Recent Labs  11/19/14 0410 11/20/14 1015  NA 139 140  K 3.1* 3.2*  CL 106 108  CO2 25 25  GLUCOSE 116* 183*  BUN <5* 6  CREATININE 0.86 0.92  CALCIUM 8.3* 8.4*   PT/INR No results for input(s): LABPROT, INR in the last 72 hours.   Recent Labs Lab 11/16/14 1543 11/20/14 1015  AST 19 15  ALT 23 15*  ALKPHOS 120 107  BILITOT 1.4* 0.7  PROT 7.8 6.3*  ALBUMIN 3.8 2.8*     Lipase  No results found for: LIPASE   Studies/Results: No results found.  Medications: . amoxicillin-clavulanate  1 tablet Oral Q12H  . heparin  5,000 Units Subcutaneous 3 times per day  . hydrALAZINE  10 mg Oral 3 times per day  . insulin aspart  0-20 Units Subcutaneous TID WC & HS  . insulin glargine  15 Units Subcutaneous QHS  . nicotine  14 mg Transdermal Daily  . potassium chloride  20 mEq Oral  TID  . saccharomyces boulardii  250 mg Oral BID    Assessment/Plan Right groin abscess Bedside I&D 11/17/14, Dr. Zella Richer Debridement of right groin abscess, with general anesthesia 11/17/14 Dr. Johnathan Hausen AODM - Hemoglobin A1C 12.3 Ongoing tobacco use Left mild hydronephrosis without obstruction Antibiotics: Day 5 Zosyn/Vancomycin No wound culture/Blood cultures No growth so far DVT: SCD/Heparin   Plan:  He is on a nicotine patch for smoking, Apresoline for his BP, and started on SQ lantus Insulin.  He is going to follow up with PCP for medical management. He is booked for 12/05/14 in [place] for follow up.    LOS: 4 days    Irven Ingalsbe 11/21/2014

## 2014-11-21 NOTE — Discharge Instructions (Signed)
Diabetes Mellitus and Food It is important for you to manage your blood sugar (glucose) level. Your blood glucose level can be greatly affected by what you eat. Eating healthier foods in the appropriate amounts throughout the day at about the same time each day will help you control your blood glucose level. It can also help slow or prevent worsening of your diabetes mellitus. Healthy eating may even help you improve the level of your blood pressure and reach or maintain a healthy weight.  HOW CAN FOOD AFFECT ME? Carbohydrates Carbohydrates affect your blood glucose level more than any other type of food. Your dietitian will help you determine how many carbohydrates to eat at each meal and teach you how to count carbohydrates. Counting carbohydrates is important to keep your blood glucose at a healthy level, especially if you are using insulin or taking certain medicines for diabetes mellitus. Alcohol Alcohol can cause sudden decreases in blood glucose (hypoglycemia), especially if you use insulin or take certain medicines for diabetes mellitus. Hypoglycemia can be a life-threatening condition. Symptoms of hypoglycemia (sleepiness, dizziness, and disorientation) are similar to symptoms of having too much alcohol.  If your health care provider has given you approval to drink alcohol, do so in moderation and use the following guidelines:  Women should not have more than one drink per day, and men should not have more than two drinks per day. One drink is equal to:  12 oz of beer.  5 oz of wine.  1 oz of hard liquor.  Do not drink on an empty stomach.  Keep yourself hydrated. Have water, diet soda, or unsweetened iced tea.  Regular soda, juice, and other mixers might contain a lot of carbohydrates and should be counted. WHAT FOODS ARE NOT RECOMMENDED? As you make food choices, it is important to remember that all foods are not the same. Some foods have fewer nutrients per serving than other  foods, even though they might have the same number of calories or carbohydrates. It is difficult to get your body what it needs when you eat foods with fewer nutrients. Examples of foods that you should avoid that are high in calories and carbohydrates but low in nutrients include:  Trans fats (most processed foods list trans fats on the Nutrition Facts label).  Regular soda.  Juice.  Candy.  Sweets, such as cake, pie, doughnuts, and cookies.  Fried foods. WHAT FOODS CAN I EAT? Have nutrient-rich foods, which will nourish your body and keep you healthy. The food you should eat also will depend on several factors, including:  The calories you need.  The medicines you take.  Your weight.  Your blood glucose level.  Your blood pressure level.  Your cholesterol level. You also should eat a variety of foods, including:  Protein, such as meat, poultry, fish, tofu, nuts, and seeds (lean animal proteins are best).  Fruits.  Vegetables.  Dairy products, such as milk, cheese, and yogurt (low fat is best).  Breads, grains, pasta, cereal, rice, and beans.  Fats such as olive oil, trans fat-free margarine, canola oil, avocado, and olives. DOES EVERYONE WITH DIABETES MELLITUS HAVE THE SAME MEAL PLAN? Because every person with diabetes mellitus is different, there is not one meal plan that works for everyone. It is very important that you meet with a dietitian who will help you create a meal plan that is just right for you. Document Released: 01/16/2005 Document Revised: 04/26/2013 Document Reviewed: 03/18/2013 [employer] Patient Information 2015 [employer]. This   information is not intended to replace advice given to you by your health care provider. Make sure you discuss any questions you have with your health care provider.  Peri-Rectal Abscess Your caregiver has diagnosed you as having a peri-rectal abscess. This is an infected area near the rectum that is filled with pus. If the  abscess is near the surface of the skin, your caregiver may open (incise) the area and drain the pus. HOME CARE INSTRUCTIONS   If your abscess was opened up and drained. A small piece of gauze may be placed in the opening so that it can drain. Do not remove the gauze unless directed by your caregiver.  A loose dressing may be placed over the abscess site. Change the dressing as often as necessary to keep it clean and dry.  After the drain is removed, the area may be washed with a gentle antiseptic (soap) four times per day.  A warm sitz bath, warm packs or heating pad may be used for pain relief, taking care not to burn yourself.  Return for a wound check in 1 day or as directed.  An "inflatable doughnut" may be used for sitting with added comfort. These can be purchased at a drugstore or medical supply house.  To reduce pain and straining with bowel movements, eat a high fiber diet with plenty of fruits and vegetables. Use stool softeners as recommended by your caregiver. This is especially important if narcotic type pain medications were prescribed as these may cause marked constipation.  Only take over-the-counter or prescription medicines for pain, discomfort, or fever as directed by your caregiver. SEEK IMMEDIATE MEDICAL CARE IF:   You have increasing pain that is not controlled by medication.  There is increased inflammation (redness), swelling, bleeding, or drainage from the area.  An oral temperature above 102 F (38.9 C) develops.  You develop chills or generalized malaise (feel lethargic or feel "washed out").  You develop any new symptoms (problems) you feel may be related to your present problem. Document Released: 04/18/2000 Document Revised: 07/14/2011 Document Reviewed: 04/18/2008 [facility] Patient Information 2015 [place], [place]. This information is not intended to replace advice given to you by your health care provider. Make sure you discuss any questions you have  with your health care provider.  Nicotine Addiction Nicotine can act as both a stimulant (excites/activates) and a sedative (calms/quiets). Immediately after exposure to nicotine, there is a "kick" caused in part by the drug's stimulation of the adrenal glands and resulting discharge of adrenaline (epinephrine). The rush of adrenaline stimulates the body and causes a sudden release of sugar. This means that smokers are always slightly hyperglycemic. Hyperglycemic means that the blood sugar is high, just like in diabetics. Nicotine also decreases the amount of insulin which helps control sugar levels in the body. There is an increase in blood pressure, breathing, and the rate of heart beats.  In addition, nicotine indirectly causes a release of dopamine in the brain that controls pleasure and motivation. A similar reaction is seen with other drugs of abuse, such as cocaine and heroin. This dopamine release is thought to cause the pleasurable sensations when smoking. In some different cases, nicotine can also create a calming effect, depending on sensitivity of the smoker's nervous system and the dose of nicotine taken. WHAT HAPPENS WHEN NICOTINE IS TAKEN FOR LONG PERIODS OF TIME?  Long-term use of nicotine results in addiction. It is difficult to stop.  Repeated use of nicotine creates tolerance. Higher doses of  nicotine are needed to get the "kick." When nicotine use is stopped, withdrawal may last a month or more. Withdrawal may begin within a few hours after the last cigarette. Symptoms peak within the first few days and may lessen within a few weeks. For some people, however, symptoms may last for months or longer. Withdrawal symptoms include:   Irritability.  Craving.  Learning and attention deficits.  Sleep disturbances.  Increased appetite. Craving for tobacco may last for 6 months or longer. Many behaviors done while using nicotine can also play a part in the severity of withdrawal  symptoms. For some people, the feel, smell, and sight of a cigarette and the ritual of obtaining, handling, lighting, and smoking the cigarette are closely linked with the pleasure of smoking. When stopped, they also miss the related behaviors which make the withdrawal or craving worse. While nicotine gum and patches may lessen the drug aspects of withdrawal, cravings often persist. WHAT ARE THE MEDICAL CONSEQUENCES OF NICOTINE USE?  Nicotine addiction accounts for one-third of all cancers. The top cancer caused by tobacco is lung cancer. Lung cancer is the number one cancer killer of both men and women.  Smoking is also associated with cancers of the:  Mouth.  Pharynx.  Larynx.  Esophagus.  Stomach.  Pancreas.  Cervix.  Kidney.  Ureter.  Bladder.  Smoking also causes lung diseases such as lasting (chronic) bronchitis and emphysema.  It worsens asthma in adults and children.  Smoking increases the risk of heart disease, including:  Stroke.  Heart attack.  Vascular disease.  Aneurysm.  Passive or secondary smoke can also increase medical risks including:  Asthma in children.  Sudden Infant Death Syndrome (SIDS).  Additionally, dropped cigarettes are the leading cause of residential fire fatalities.  Nicotine poisoning has been reported from accidental ingestion of tobacco products by children and pets. Death usually results in a few minutes from respiratory failure (when a person stops breathing) caused by paralysis. TREATMENT   Medication. Nicotine replacement medicines such as nicotine gum and the patch are used to stop smoking. These medicines gradually lower the dosage of nicotine in the body. These medicines do not contain the carbon monoxide and other toxins found in tobacco smoke.  Hypnotherapy.  Relaxation therapy.  Nicotine Anonymous (a 12-step support program). Find times and locations in your local yellow pages. Document Released: 12/26/2003  Document Revised: 07/14/2011 Document Reviewed: 06/17/2013 [facility] Patient Information 2015 [place], [place]. This information is not intended to replace advice given to you by your health care provider. Make sure you discuss any questions you have with your health care provider. Smoking Cessation Quitting smoking is important to your health and has many advantages. However, it is not always easy to quit since nicotine is a very addictive drug. Oftentimes, people try 3 times or more before being able to quit. This document explains the best ways for you to prepare to quit smoking. Quitting takes hard work and a lot of effort, but you can do it. ADVANTAGES OF QUITTING SMOKING  You will live longer, feel better, and live better.  Your body will feel the impact of quitting smoking almost immediately.  Within 20 minutes, blood pressure decreases. Your pulse returns to its normal level.  After 8 hours, carbon monoxide levels in the blood return to normal. Your oxygen level increases.  After 24 hours, the chance of having a heart attack starts to decrease. Your breath, hair, and body stop smelling like smoke.  After 48 hours, damaged  nerve endings begin to recover. Your sense of taste and smell improve.  After 72 hours, the body is virtually free of nicotine. Your bronchial tubes relax and breathing becomes easier.  After 2 to 12 weeks, lungs can hold more air. Exercise becomes easier and circulation improves.  The risk of having a heart attack, stroke, cancer, or lung disease is greatly reduced.  After 1 year, the risk of coronary heart disease is cut in half.  After 5 years, the risk of stroke falls to the same as a nonsmoker.  After 10 years, the risk of lung cancer is cut in half and the risk of other cancers decreases significantly.  After 15 years, the risk of coronary heart disease drops, usually to the level of a nonsmoker.  If you are pregnant, quitting smoking will improve your  chances of having a healthy baby.  The people you live with, especially any children, will be healthier.  You will have extra money to spend on things other than cigarettes. QUESTIONS TO THINK ABOUT BEFORE ATTEMPTING TO QUIT You may want to talk about your answers with your health care provider.  Why do you want to quit?  If you tried to quit in the past, what helped and what did not?  What will be the most difficult situations for you after you quit? How will you plan to handle them?  Who can help you through the tough times? Your family? Friends? A health care provider?  What pleasures do you get from smoking? What ways can you still get pleasure if you quit? Here are some questions to ask your health care provider:  How can you help me to be successful at quitting?  What medicine do you think would be best for me and how should I take it?  What should I do if I need more help?  What is smoking withdrawal like? How can I get information on withdrawal? GET READY  Set a quit date.  Change your environment by getting rid of all cigarettes, ashtrays, matches, and lighters in your home, car, or work. Do not let people smoke in your home.  Review your past attempts to quit. Think about what worked and what did not. GET SUPPORT AND ENCOURAGEMENT You have a better chance of being successful if you have help. You can get support in many ways.  Tell your family, friends, and coworkers that you are going to quit and need their support. Ask them not to smoke around you.  Get individual, group, or telephone counseling and support. Programs are available at [employer] and health centers. Call your local health department for information about programs in your area.  Spiritual beliefs and practices may help some smokers quit.  Download a "quit meter" on your computer to keep track of quit statistics, such as how long you have gone without smoking, cigarettes not smoked, and money  saved.  Get a self-help book about quitting smoking and staying off tobacco. [place] yourself from urges to smoke. Talk to someone, go for a walk, or occupy your time with a task.  Change your normal routine. Take a different route to work. Drink tea instead of coffee. Eat breakfast in a different place.  Reduce your stress. Take a hot bath, exercise, or read a book.  Plan something enjoyable to do every day. Reward yourself for not smoking.  Explore interactive web-based programs that specialize in helping you quit. GET MEDICINE AND USE  IT CORRECTLY Medicines can help you stop smoking and decrease the urge to smoke. Combining medicine with the above behavioral methods and support can greatly increase your chances of successfully quitting smoking.  Nicotine replacement therapy helps deliver nicotine to your body without the negative effects and risks of smoking. Nicotine replacement therapy includes nicotine gum, lozenges, inhalers, nasal sprays, and skin patches. Some may be available over-the-counter and others require a prescription.  Antidepressant medicine helps people abstain from smoking, but how this works is unknown. This medicine is available by prescription.  Nicotinic receptor partial agonist medicine simulates the effect of nicotine in your brain. This medicine is available by prescription. Ask your health care provider for advice about which medicines to use and how to use them based on your health history. Your health care provider will tell you what side effects to look out for if you choose to be on a medicine or therapy. Carefully read the information on the package. Do not use any other product containing nicotine while using a nicotine replacement product.  RELAPSE OR DIFFICULT SITUATIONS Most relapses occur within the first 3 months after quitting. Do not be discouraged if you start smoking again. Remember, most people try several times  before finally quitting. You may have symptoms of withdrawal because your body is used to nicotine. You may crave cigarettes, be irritable, feel very hungry, cough often, get headaches, or have difficulty concentrating. The withdrawal symptoms are only temporary. They are strongest when you first quit, but they will go away within 10-14 days. To reduce the chances of relapse, try to:  Avoid drinking alcohol. Drinking lowers your chances of successfully quitting.  Reduce the amount of caffeine you consume. Once you quit smoking, the amount of caffeine in your body increases and can give you symptoms, such as a rapid heartbeat, sweating, and anxiety.  Avoid smokers because they can make you want to smoke.  Do not let weight gain distract you. Many smokers will gain weight when they quit, usually less than 10 pounds. Eat a healthy diet and stay active. You can always lose the weight gained after you quit.  Find ways to improve your mood other than smoking. FOR MORE INFORMATION  www.smokefree.gov  Document Released: 04/15/2001 Document Revised: 09/05/2013 Document Reviewed: 07/31/2011 [facility] Patient Information 2015 [place], [place]. This information is not intended to replace advice given to you by your health care provider. Make sure you discuss any questions you have with your health care provider.

## 2015-02-05 DIAGNOSIS — F17218 Nicotine dependence, cigarettes, with other nicotine-induced disorders: Secondary | ICD-10-CM | POA: Insufficient documentation

## 2015-02-05 DIAGNOSIS — E785 Hyperlipidemia, unspecified: Secondary | ICD-10-CM | POA: Insufficient documentation

## 2015-02-05 DIAGNOSIS — E1169 Type 2 diabetes mellitus with other specified complication: Secondary | ICD-10-CM | POA: Insufficient documentation

## 2015-02-14 ENCOUNTER — Encounter (HOSPITAL_COMMUNITY): Payer: Self-pay | Admitting: Emergency Medicine

## 2015-02-14 ENCOUNTER — Emergency Department (HOSPITAL_COMMUNITY)
Admission: EM | Admit: 2015-02-14 | Discharge: 2015-02-14 | Disposition: A | Discharge status: Home / Self Care | Payer: 59 | Attending: Physician Assistant | Admitting: Physician Assistant

## 2015-02-14 DIAGNOSIS — I10 Essential (primary) hypertension: Secondary | ICD-10-CM | POA: Diagnosis not present

## 2015-02-14 DIAGNOSIS — L731 Pseudofolliculitis barbae: Secondary | ICD-10-CM | POA: Diagnosis not present

## 2015-02-14 DIAGNOSIS — Z720 Tobacco use: Secondary | ICD-10-CM | POA: Insufficient documentation

## 2015-02-14 DIAGNOSIS — E119 Type 2 diabetes mellitus without complications: Secondary | ICD-10-CM | POA: Diagnosis not present

## 2015-02-14 DIAGNOSIS — R21 Rash and other nonspecific skin eruption: Secondary | ICD-10-CM | POA: Diagnosis present

## 2015-02-14 DIAGNOSIS — Z794 Long term (current) use of insulin: Secondary | ICD-10-CM | POA: Diagnosis not present

## 2015-02-14 DIAGNOSIS — L738 Other specified follicular disorders: Secondary | ICD-10-CM

## 2015-02-14 HISTORY — DX: Essential (primary) hypertension: I10

## 2015-02-14 MED ORDER — CEPHALEXIN 500 MG PO CAPS: 500.0000 mg | ORAL_CAPSULE | Freq: Two times a day (BID) | ORAL | Status: DC

## 2015-02-14 MED ORDER — HYDRALAZINE HCL 25 MG PO TABS
25.0000 mg | ORAL_TABLET | Freq: Once | ORAL | Status: AC
  Administered 2015-02-14: 25 mg via ORAL
  Filled 2015-02-14 (×2): qty 1

## 2015-02-14 MED ORDER — HYDRALAZINE HCL 25 MG PO TABS: 25.0000 mg | ORAL_TABLET | Freq: Two times a day (BID) | ORAL | Status: DC

## 2015-02-14 MED ORDER — CEPHALEXIN 250 MG PO CAPS
500.0000 mg | ORAL_CAPSULE | ORAL | Status: AC
  Administered 2015-02-14: 500 mg via ORAL
  Filled 2015-02-14: qty 2

## 2015-02-14 NOTE — ED Notes (Signed)
bp better.  The pt did not take his bp med today

## 2015-02-14 NOTE — ED Notes (Signed)
bp med given

## 2015-02-14 NOTE — ED Provider Notes (Signed)
CSN: [id number]     Arrival date & time 02/14/15  2002 History   First MD Initiated Contact with Patient 02/14/15 2045     Chief Complaint  Patient presents with  . Rash     (Consider location/radiation/quality/duration/timing/severity/associated sxs/prior Treatment) HPI Comments: Rash started after shaving with razor- usually uses clippers.  Since that time has gotten worse, has been using peroxide and topical triple antibiotic ointment without relief.  Blood sugars have been in the 130-140 range, denies fever Reports has not called his PCP, had not taken normal PM dose of HTN medication  Patient is a 44 y.o. male presenting with rash. The history is provided by the patient.  Rash Location: neck. Quality: weeping   Severity:  Moderate Onset quality:  Gradual Duration:  10 days Timing:  Constant Progression:  Worsening Chronicity:  New Context comment:  After shaving wiht razor Relieved by:  Nothing Worsened by:  Nothing tried Ineffective treatments:  Antibiotic cream Associated symptoms: no fever and no headaches     Past Medical History  Diagnosis Date  . Diabetes mellitus without complication ([place])   . Tobacco use disorder 11/21/2014  . Hypertension    Past Surgical History  Procedure Laterality Date  . Incision and drainage perirectal abscess Right 11/17/2014    Procedure: debridement of right perianal and groin wound;  Surgeon: Johnathan Hausen, MD;  Location: [facility];  Service: General;  Laterality: Right;   No family history on file. Social History  Substance Use Topics  . Smoking status: Current Every Day Smoker -- 1.00 packs/day for 24 years    Types: Cigarettes  . Smokeless tobacco: None  . Alcohol Use: Yes    Review of Systems  Constitutional: Negative for fever and chills.  HENT: Negative for trouble swallowing.   Skin: Positive for rash.  Neurological: Negative for dizziness and headaches.  All other systems reviewed and are negative.     Allergies   Review of patient's allergies indicates no known allergies.  Home Medications   Prior to Admission medications   Medication Sig Start Date End Date Taking? Authorizing Provider  Alcohol Swabs PADS 1 Package by Does not apply route as needed (with blood glucose checks). 11/21/14   Robbie Lis, MD  amoxicillin-clavulanate (AUGMENTIN) 875-125 MG per tablet Take 1 tablet by mouth every 12 (twelve) hours. 11/20/14   Earnstine Regal, PA-C  cephALEXin (KEFLEX) 500 MG capsule Take 1 capsule (500 mg total) by mouth 2 (two) times daily. 02/14/15   Junius Creamer, NP  glucose blood (CHOICE DM FORA G20 TEST STRIPS) test strip Use as instructed 11/21/14   Robbie Lis, MD  glucose monitoring kit (FREESTYLE) monitoring kit 1 each by Does not apply route as needed for other. 11/21/14   Robbie Lis, MD  hydrALAZINE (APRESOLINE) 25 MG tablet Take 1 tablet (25 mg total) by mouth every 12 (twelve) hours. 11/21/14   Earnstine Regal, PA-C  insulin aspart (NOVOLOG) 100 UNIT/ML FlexPen Please use scale below to see how much insulin to use depending on you checked blood sugar level:  CBG 121 - 150: 3 units    CBG 151 - 200: 4 units    CBG 201 - 250: 7 units    CBG 251 - 300: 11 units    CBG 301 - 350: 15 units    CBG 351 - 400: 20 units 11/21/14   Robbie Lis, MD  Insulin Detemir (LEVEMIR FLEXPEN) 100 UNIT/ML Pen Inject 15 Units into the skin  daily at 10 pm. 11/21/14   Earnstine Regal, PA-C  Lancets (FREESTYLE) lancets Use as instructed 11/21/14   Robbie Lis, MD  nicotine (NICODERM CQ - DOSED IN MG/24 HOURS) 14 mg/24hr patch You can buy this over the counter, next dose is 7 mg. Follow their instructions. 11/21/14   Earnstine Regal, PA-C  oxyCODONE-acetaminophen (PERCOCET/ROXICET) 5-325 MG per tablet Take 1-2 tablets by mouth every 4 (four) hours as needed for moderate pain. 11/20/14   Earnstine Regal, PA-C  saccharomyces boulardii (FLORASTOR) 250 MG capsule This probiotic is to protect you from  colitis, use for at least 1 week after you finish antibiotics.  You can buy at almost any drug store over the counter.  It comes in generic forms. 11/20/14   Earnstine Regal, PA-C   BP 155/103 mmHg  Pulse 76  Temp(Src) 98.7 F (37.1 C) (Oral)  Resp 16  SpO2 98% Physical Exam  Constitutional: He appears well-developed and well-nourished.  HENT:  Head: Normocephalic.  Eyes: Pupils are equal, round, and reactive to light.  Neck: Normal range of motion.  Cardiovascular: Normal rate and regular rhythm.   Pulmonary/Chest: Effort normal.  Musculoskeletal: Normal range of motion.  Lymphadenopathy:    He has no cervical adenopathy.  Neurological: He is alert.  Skin: Rash noted. There is erythema.     Nursing note and vitals reviewed.   ED Course  Procedures (including critical care time) Labs Review Labs Reviewed - No data to display  Imaging Review No results found. I have personally reviewed and evaluated these images and lab results as part of my medical decision-making.   EKG Interpretation None     Will start Keflex, give PM dose of Hydralazine recheck BP  MDM   Final diagnoses:  Folliculitis barbae         Junius Creamer, NP 02/15/15 [place], MD 02/17/15 [phone]

## 2015-02-14 NOTE — ED Notes (Signed)
Draining rash on his anterior  Neck for 1-2 weeks.  No previous history

## 2015-02-14 NOTE — ED Notes (Signed)
C/o rash to anterior neck and chest x 1 1/2 weeks.

## 2015-02-14 NOTE — Discharge Instructions (Signed)
Measures taken.  Antibiotic as directed until all tablets have been completed.  Please do not use anything topically on your rash such as peroxide or triple antibiotic ointment.  Will need to keep the oozing area covered for the next couple days until it starts to dry up just to decrease irritation from near should call us

## 2016-04-20 ENCOUNTER — Emergency Department (HOSPITAL_COMMUNITY): Payer: 59

## 2016-04-20 ENCOUNTER — Inpatient Hospital Stay (HOSPITAL_COMMUNITY)
Admission: EM | Admit: 2016-04-20 | Discharge: 2016-04-25 | DRG: 286 | Disposition: A | Discharge status: Home / Self Care | Payer: 59 | Attending: Internal Medicine | Admitting: Internal Medicine

## 2016-04-20 ENCOUNTER — Encounter (HOSPITAL_COMMUNITY): Payer: Self-pay | Admitting: Emergency Medicine

## 2016-04-20 DIAGNOSIS — I272 Pulmonary hypertension, unspecified: Secondary | ICD-10-CM | POA: Diagnosis present

## 2016-04-20 DIAGNOSIS — Z888 Allergy status to other drugs, medicaments and biological substances status: Secondary | ICD-10-CM

## 2016-04-20 DIAGNOSIS — R748 Abnormal levels of other serum enzymes: Secondary | ICD-10-CM | POA: Diagnosis present

## 2016-04-20 DIAGNOSIS — I10 Essential (primary) hypertension: Secondary | ICD-10-CM | POA: Diagnosis present

## 2016-04-20 DIAGNOSIS — R7989 Other specified abnormal findings of blood chemistry: Secondary | ICD-10-CM | POA: Diagnosis present

## 2016-04-20 DIAGNOSIS — Z7984 Long term (current) use of oral hypoglycemic drugs: Secondary | ICD-10-CM

## 2016-04-20 DIAGNOSIS — I255 Ischemic cardiomyopathy: Secondary | ICD-10-CM | POA: Diagnosis present

## 2016-04-20 DIAGNOSIS — J189 Pneumonia, unspecified organism: Secondary | ICD-10-CM | POA: Diagnosis present

## 2016-04-20 DIAGNOSIS — J9601 Acute respiratory failure with hypoxia: Secondary | ICD-10-CM | POA: Diagnosis present

## 2016-04-20 DIAGNOSIS — I5021 Acute systolic (congestive) heart failure: Secondary | ICD-10-CM | POA: Diagnosis present

## 2016-04-20 DIAGNOSIS — IMO0002 Reserved for concepts with insufficient information to code with codable children: Secondary | ICD-10-CM | POA: Diagnosis present

## 2016-04-20 DIAGNOSIS — R778 Other specified abnormalities of plasma proteins: Secondary | ICD-10-CM | POA: Diagnosis present

## 2016-04-20 DIAGNOSIS — I110 Hypertensive heart disease with heart failure: Secondary | ICD-10-CM | POA: Diagnosis not present

## 2016-04-20 DIAGNOSIS — Z833 Family history of diabetes mellitus: Secondary | ICD-10-CM

## 2016-04-20 DIAGNOSIS — E1165 Type 2 diabetes mellitus with hyperglycemia: Secondary | ICD-10-CM | POA: Diagnosis present

## 2016-04-20 DIAGNOSIS — I4510 Unspecified right bundle-branch block: Secondary | ICD-10-CM | POA: Diagnosis present

## 2016-04-20 DIAGNOSIS — K219 Gastro-esophageal reflux disease without esophagitis: Secondary | ICD-10-CM | POA: Diagnosis present

## 2016-04-20 DIAGNOSIS — I2510 Atherosclerotic heart disease of native coronary artery without angina pectoris: Secondary | ICD-10-CM | POA: Diagnosis present

## 2016-04-20 DIAGNOSIS — I509 Heart failure, unspecified: Secondary | ICD-10-CM

## 2016-04-20 DIAGNOSIS — I2582 Chronic total occlusion of coronary artery: Secondary | ICD-10-CM | POA: Diagnosis present

## 2016-04-20 DIAGNOSIS — R911 Solitary pulmonary nodule: Secondary | ICD-10-CM | POA: Diagnosis present

## 2016-04-20 DIAGNOSIS — I5020 Unspecified systolic (congestive) heart failure: Secondary | ICD-10-CM

## 2016-04-20 DIAGNOSIS — E669 Obesity, unspecified: Secondary | ICD-10-CM | POA: Diagnosis present

## 2016-04-20 DIAGNOSIS — F17210 Nicotine dependence, cigarettes, uncomplicated: Secondary | ICD-10-CM | POA: Diagnosis present

## 2016-04-20 DIAGNOSIS — I5041 Acute combined systolic (congestive) and diastolic (congestive) heart failure: Secondary | ICD-10-CM | POA: Diagnosis present

## 2016-04-20 DIAGNOSIS — Z6831 Body mass index (BMI) 31.0-31.9, adult: Secondary | ICD-10-CM

## 2016-04-20 HISTORY — DX: Acute systolic (congestive) heart failure: I50.21

## 2016-04-20 LAB — CBC
HCT: 36.2 % — ABNORMAL LOW (ref 39.0–52.0)
Hemoglobin: 12.6 g/dL — ABNORMAL LOW (ref 13.0–17.0)
MCH: 29.2 pg (ref 26.0–34.0)
MCHC: 34.8 g/dL (ref 30.0–36.0)
MCV: 84 fL (ref 78.0–100.0)
PLATELETS: 442 10*3/uL — AB (ref 150–400)
RBC: 4.31 MIL/uL (ref 4.22–5.81)
RDW: 13.9 % (ref 11.5–15.5)
WBC: 9.4 10*3/uL (ref 4.0–10.5)

## 2016-04-20 LAB — BASIC METABOLIC PANEL
ANION GAP: 8 (ref 5–15)
BUN: 6 mg/dL (ref 6–20)
CALCIUM: 9.3 mg/dL (ref 8.9–10.3)
CHLORIDE: 106 mmol/L (ref 101–111)
CO2: 24 mmol/L (ref 22–32)
CREATININE: 1 mg/dL (ref 0.61–1.24)
GFR calc non Af Amer: >60 mL/min (ref >60)
Glucose, Bld: 148 mg/dL — ABNORMAL HIGH (ref 65–99)
Potassium: 3.8 mmol/L (ref 3.5–5.1)
SODIUM: 138 mmol/L (ref 135–145)

## 2016-04-20 LAB — I-STAT TROPONIN, ED: TROPONIN I, POC: 0.18 ng/mL — AB (ref 0.00–0.08)

## 2016-04-20 MED ORDER — DEXTROSE 5 % IV SOLN
1.0000 g | Freq: Once | INTRAVENOUS | Status: AC
  Administered 2016-04-20: 1 g via INTRAVENOUS
  Filled 2016-04-20: qty 10

## 2016-04-20 MED ORDER — DEXTROSE 5 % IV SOLN
500.0000 mg | Freq: Once | INTRAVENOUS | Status: AC
  Administered 2016-04-20: 500 mg via INTRAVENOUS
  Filled 2016-04-20: qty 500

## 2016-04-20 MED ORDER — IOPAMIDOL (ISOVUE-300) INJECTION 61%
INTRAVENOUS | Status: AC
  Administered 2016-04-20: 75 mL
  Filled 2016-04-20: qty 75

## 2016-04-20 NOTE — ED Notes (Signed)
Pt placed on 6L [place] for SOB

## 2016-04-20 NOTE — ED Triage Notes (Signed)
C/o L chest pain, sob, and non-productive cough x 1 week.  States he was seen for same 1 week ago and diagnosed with pneumonia.  Completed antibiotic.

## 2016-04-20 NOTE — ED Provider Notes (Addendum)
[facility] DEPT Provider Note   CSN: [id number] Arrival date & time: 04/20/16  1856     History   Chief Complaint Chief Complaint  Patient presents with  . Chest Pain    HPI Christopher Roth is a 45 y.o. male.  Patient presents with dyspnea, chest pain, cough for 10 days.  He was started on antibiotics and finished the course. He had a follow-up primary care visit and his chest x-ray was unchanged. Past medical history includes type 2 diabetes and low-grade hypertension.  He quit smoking 2 weeks ago. He works as a Administrator, arts.      Past Medical History:  Diagnosis Date  . Diabetes mellitus without complication ([place])   . Hypertension   . Tobacco use disorder 11/21/2014    Patient Active Problem List   Diagnosis Date Noted  . Tobacco use disorder 11/21/2014  . Diabetes mellitus type II, uncontrolled ([place])   . Perirectal abscess 11/17/2014  . HAND PAIN, RIGHT 08/04/2007  . OBESITY 01/01/2007  . ERECTILE DYSFUNCTION 01/01/2007  . Essential hypertension 01/01/2007  . ALLERGIC RHINITIS 01/01/2007  . GERD 01/01/2007    Past Surgical History:  Procedure Laterality Date  . INCISION AND DRAINAGE PERIRECTAL ABSCESS Right 11/17/2014   Procedure: debridement of right perianal and groin wound;  Surgeon: Johnathan Hausen, MD;  Location: [facility];  Service: General;  Laterality: Right;       Home Medications    Prior to Admission medications   Medication Sig Start Date End Date Taking? Authorizing Provider  albuterol (PROVENTIL HFA;VENTOLIN HFA) 108 (90 Base) MCG/ACT inhaler Inhale 2 puffs into the lungs every 6 (six) hours as needed for wheezing or shortness of breath.   Yes Historical Provider, MD  Alcohol Swabs PADS 1 Package by Does not apply route as needed (with blood glucose checks). 11/21/14  Yes Robbie Lis, MD  glipiZIDE (GLUCOTROL XL) 10 MG 24 hr tablet Take 10 mg by mouth daily with breakfast.   Yes Historical Provider, MD  glucose blood (CHOICE DM FORA G20  TEST STRIPS) test strip Use as instructed 11/21/14  Yes Robbie Lis, MD  glucose monitoring kit (FREESTYLE) monitoring kit 1 each by Does not apply route as needed for other. 11/21/14  Yes Robbie Lis, MD  guaiFENesin-codeine 100-10 MG/5ML syrup Take 10 mLs by mouth 3 (three) times daily as needed for cough.   Yes Historical Provider, MD  Lancets (FREESTYLE) lancets Use as instructed 11/21/14  Yes Robbie Lis, MD  metFORMIN (GLUCOPHAGE-XR) 500 MG 24 hr tablet Take 1,000 mg by mouth 2 (two) times daily.   Yes Historical Provider, MD    Family History No family history on file.  Social History Social History  Substance Use Topics  . Smoking status: Current Every Day Smoker    Packs/day: 1.00    Years: 24.00    Types: Cigarettes  . Smokeless tobacco: Never Used  . Alcohol use Yes     Allergies   Januvia [sitagliptin]   Review of Systems Review of Systems  All other systems reviewed and are negative.    Physical Exam Updated Vital Signs BP 153/93   Pulse 89   Temp 98.6 F (37 C) (Oral)   Resp 22   Ht 5' 8"  (1.727 m)   Wt 209 lb (94.8 kg)   SpO2 90%   BMI 31.78 kg/m   Physical Exam  Constitutional: He is oriented to person, place, and time. He appears well-developed and well-nourished.  HENT:  Head: Normocephalic and atraumatic.  Eyes: Conjunctivae are normal.  Neck: Neck supple.  Cardiovascular: Normal rate and regular rhythm.   Pulmonary/Chest: Breath sounds normal.  Slight tachypnea and dyspnea.  Abdominal: Soft. Bowel sounds are normal.  Musculoskeletal: Normal range of motion.  Neurological: He is alert and oriented to person, place, and time.  Skin: Skin is warm and dry.  Psychiatric: He has a normal mood and affect. His behavior is normal.  Nursing note and vitals reviewed.    ED Treatments / Results  Labs (all labs ordered are listed, but only abnormal results are displayed) Labs Reviewed  BASIC METABOLIC PANEL - Abnormal; Notable for the  following:       Result Value   Glucose, Bld 148 (*)    All other components within normal limits  CBC - Abnormal; Notable for the following:    Hemoglobin 12.6 (*)    HCT 36.2 (*)    Platelets 442 (*)    All other components within normal limits  I-STAT TROPOININ, ED - Abnormal; Notable for the following:    Troponin i, poc 0.18 (*)    All other components within normal limits    EKG shows no acute ST elevation  Radiology Dg Chest 2 View  Result Date: 04/20/2016 CLINICAL DATA:  Pt has had SOB and left sided chest pain for the last week. Pt also c/o dry cough. Pt has a hx of HTN, diabetes and is a recent former smoker. EXAM: CHEST  2 VIEW COMPARISON:  None. FINDINGS: Heart size is mildly enlarged. There are patchy infiltrates involving the lower lobes, lingula, and right middle lobe. Small bilateral pleural effusions are suspected. No evidence for pulmonary edema. IMPRESSION: Multifocal infiltrates. Electronically Signed   By: Nolon Nations M.D.   On: 04/20/2016 20:13    Procedures Procedures (including critical care time)  Medications Ordered in ED Medications  azithromycin (ZITHROMAX) 500 mg in dextrose 5 % 250 mL IVPB (not administered)  iopamidol (ISOVUE-300) 61 % injection (75 mLs  Contrast Given 04/20/16 2231)  cefTRIAXone (ROCEPHIN) 1 g in dextrose 5 % 50 mL IVPB (1 g Intravenous New Bag/Given 04/20/16 2319)     Initial Impression / Assessment and Plan / ED Course  I have reviewed the triage vital signs and the nursing notes.  Pertinent labs & imaging results that were available during my care of the patient were reviewed by me and considered in my medical decision making (see chart for details).  Clinical Course     Patient is hemodynamically stable. Discussed elevated troponin with cardiologist. Chest x-ray reveals multifocal infiltrates. IV fluids, IV Zithromax, IV Rocephin. CT chest pending. Discussed with Dr. Christy Gentles  Final Clinical Impressions(s) / ED  Diagnoses   Final diagnoses:  Community acquired pneumonia, unspecified laterality  Elevated troponin    New Prescriptions New Prescriptions   No medications on file     Nat Christen, MD 04/20/16 [place], MD 04/21/16 9741

## 2016-04-21 ENCOUNTER — Inpatient Hospital Stay (HOSPITAL_COMMUNITY): Payer: 59

## 2016-04-21 ENCOUNTER — Encounter (HOSPITAL_COMMUNITY): Payer: Self-pay | Admitting: Internal Medicine

## 2016-04-21 DIAGNOSIS — I5041 Acute combined systolic (congestive) and diastolic (congestive) heart failure: Secondary | ICD-10-CM | POA: Diagnosis present

## 2016-04-21 DIAGNOSIS — Z833 Family history of diabetes mellitus: Secondary | ICD-10-CM | POA: Diagnosis not present

## 2016-04-21 DIAGNOSIS — E1159 Type 2 diabetes mellitus with other circulatory complications: Secondary | ICD-10-CM | POA: Diagnosis not present

## 2016-04-21 DIAGNOSIS — R911 Solitary pulmonary nodule: Secondary | ICD-10-CM | POA: Diagnosis present

## 2016-04-21 DIAGNOSIS — I5021 Acute systolic (congestive) heart failure: Secondary | ICD-10-CM | POA: Diagnosis not present

## 2016-04-21 DIAGNOSIS — I2510 Atherosclerotic heart disease of native coronary artery without angina pectoris: Secondary | ICD-10-CM | POA: Diagnosis present

## 2016-04-21 DIAGNOSIS — Z6831 Body mass index (BMI) 31.0-31.9, adult: Secondary | ICD-10-CM | POA: Diagnosis not present

## 2016-04-21 DIAGNOSIS — J189 Pneumonia, unspecified organism: Secondary | ICD-10-CM | POA: Diagnosis present

## 2016-04-21 DIAGNOSIS — E1165 Type 2 diabetes mellitus with hyperglycemia: Secondary | ICD-10-CM | POA: Diagnosis present

## 2016-04-21 DIAGNOSIS — R748 Abnormal levels of other serum enzymes: Secondary | ICD-10-CM | POA: Diagnosis present

## 2016-04-21 DIAGNOSIS — I10 Essential (primary) hypertension: Secondary | ICD-10-CM | POA: Diagnosis not present

## 2016-04-21 DIAGNOSIS — K219 Gastro-esophageal reflux disease without esophagitis: Secondary | ICD-10-CM | POA: Diagnosis present

## 2016-04-21 DIAGNOSIS — I25118 Atherosclerotic heart disease of native coronary artery with other forms of angina pectoris: Secondary | ICD-10-CM | POA: Diagnosis not present

## 2016-04-21 DIAGNOSIS — E669 Obesity, unspecified: Secondary | ICD-10-CM | POA: Diagnosis present

## 2016-04-21 DIAGNOSIS — R0600 Dyspnea, unspecified: Secondary | ICD-10-CM | POA: Diagnosis not present

## 2016-04-21 DIAGNOSIS — R7989 Other specified abnormal findings of blood chemistry: Secondary | ICD-10-CM

## 2016-04-21 DIAGNOSIS — J9601 Acute respiratory failure with hypoxia: Secondary | ICD-10-CM | POA: Diagnosis present

## 2016-04-21 DIAGNOSIS — I2582 Chronic total occlusion of coronary artery: Secondary | ICD-10-CM | POA: Diagnosis present

## 2016-04-21 DIAGNOSIS — Z888 Allergy status to other drugs, medicaments and biological substances status: Secondary | ICD-10-CM | POA: Diagnosis not present

## 2016-04-21 DIAGNOSIS — F17210 Nicotine dependence, cigarettes, uncomplicated: Secondary | ICD-10-CM | POA: Diagnosis present

## 2016-04-21 DIAGNOSIS — I110 Hypertensive heart disease with heart failure: Secondary | ICD-10-CM | POA: Diagnosis present

## 2016-04-21 DIAGNOSIS — I255 Ischemic cardiomyopathy: Secondary | ICD-10-CM | POA: Diagnosis present

## 2016-04-21 DIAGNOSIS — I4510 Unspecified right bundle-branch block: Secondary | ICD-10-CM | POA: Diagnosis present

## 2016-04-21 DIAGNOSIS — Z7984 Long term (current) use of oral hypoglycemic drugs: Secondary | ICD-10-CM | POA: Diagnosis not present

## 2016-04-21 DIAGNOSIS — I272 Pulmonary hypertension, unspecified: Secondary | ICD-10-CM | POA: Diagnosis present

## 2016-04-21 DIAGNOSIS — R778 Other specified abnormalities of plasma proteins: Secondary | ICD-10-CM | POA: Diagnosis present

## 2016-04-21 LAB — COMPREHENSIVE METABOLIC PANEL WITH GFR
ALT: 23 U/L (ref 17–63)
AST: 17 U/L (ref 15–41)
Albumin: 3.8 g/dL (ref 3.5–5.0)
Alkaline Phosphatase: 70 U/L (ref 38–126)
Anion gap: 7 (ref 5–15)
BUN: 7 mg/dL (ref 6–20)
CO2: 26 mmol/L (ref 22–32)
Calcium: 9.4 mg/dL (ref 8.9–10.3)
Chloride: 105 mmol/L (ref 101–111)
Creatinine, Ser: 0.92 mg/dL (ref 0.61–1.24)
GFR calc Af Amer: >60 mL/min
GFR calc non Af Amer: >60 mL/min
Glucose, Bld: 164 mg/dL — ABNORMAL HIGH (ref 65–99)
Potassium: 3.6 mmol/L (ref 3.5–5.1)
Sodium: 138 mmol/L (ref 135–145)
Total Bilirubin: 1.1 mg/dL (ref 0.3–1.2)
Total Protein: 6.7 g/dL (ref 6.5–8.1)

## 2016-04-21 LAB — TSH: TSH: 1.387 u[IU]/mL (ref 0.350–4.500)

## 2016-04-21 LAB — CBC WITH DIFFERENTIAL/PLATELET
Basophils Absolute: 0 10*3/uL (ref 0.0–0.1)
Basophils Relative: 0 %
Eosinophils Absolute: 0.1 10*3/uL (ref 0.0–0.7)
Eosinophils Relative: 2 %
HCT: 37.8 % — ABNORMAL LOW (ref 39.0–52.0)
Hemoglobin: 12.9 g/dL — ABNORMAL LOW (ref 13.0–17.0)
Lymphocytes Relative: 25 %
Lymphs Abs: 2.3 10*3/uL (ref 0.7–4.0)
MCH: 28.5 pg (ref 26.0–34.0)
MCHC: 34.1 g/dL (ref 30.0–36.0)
MCV: 83.4 fL (ref 78.0–100.0)
Monocytes Absolute: 0.5 10*3/uL (ref 0.1–1.0)
Monocytes Relative: 6 %
Neutro Abs: 6.4 10*3/uL (ref 1.7–7.7)
Neutrophils Relative %: 67 %
Platelets: 401 10*3/uL — ABNORMAL HIGH (ref 150–400)
RBC: 4.53 MIL/uL (ref 4.22–5.81)
RDW: 14 % (ref 11.5–15.5)
WBC: 9.3 10*3/uL (ref 4.0–10.5)

## 2016-04-21 LAB — HIV ANTIBODY (ROUTINE TESTING W REFLEX): HIV SCREEN 4TH GENERATION: NONREACTIVE

## 2016-04-21 LAB — GLUCOSE, CAPILLARY
GLUCOSE-CAPILLARY: 161 mg/dL — AB (ref 65–99)
GLUCOSE-CAPILLARY: 108 mg/dL — AB (ref 65–99)
GLUCOSE-CAPILLARY: 136 mg/dL — AB (ref 65–99)
Glucose-Capillary: 139 mg/dL — ABNORMAL HIGH (ref 65–99)
Glucose-Capillary: 76 mg/dL (ref 65–99)
Glucose-Capillary: 188 mg/dL — ABNORMAL HIGH (ref 65–99)

## 2016-04-21 LAB — TROPONIN I
Troponin I: 0.15 ng/mL
Troponin I: 0.14 ng/mL
Troponin I: 0.14 ng/mL

## 2016-04-21 LAB — INFLUENZA PANEL BY PCR (TYPE A & B)
Influenza A By PCR: NEGATIVE
Influenza B By PCR: NEGATIVE

## 2016-04-21 LAB — ECHOCARDIOGRAM COMPLETE
Height: 68 in
Weight: 3344 [oz_av]

## 2016-04-21 LAB — BRAIN NATRIURETIC PEPTIDE: B NATRIURETIC PEPTIDE 5: 915.6 pg/mL — AB (ref 0.0–100.0)

## 2016-04-21 LAB — D-DIMER, QUANTITATIVE: D-Dimer, Quant: 1.32 ug/mL-FEU — ABNORMAL HIGH (ref 0.00–0.50)

## 2016-04-21 MED ORDER — DEXTROSE 5 % IV SOLN
1.0000 g | Freq: Every day | INTRAVENOUS | Status: DC
  Administered 2016-04-21 – 2016-04-22 (×2): 1 g via INTRAVENOUS
  Filled 2016-04-21 (×2): qty 10

## 2016-04-21 MED ORDER — ENOXAPARIN SODIUM 40 MG/0.4ML ~~LOC~~ SOLN
40.0000 mg | SUBCUTANEOUS | Status: DC
  Administered 2016-04-21 – 2016-04-22 (×2): 40 mg via SUBCUTANEOUS
  Filled 2016-04-21 (×3): qty 0.4

## 2016-04-21 MED ORDER — ACETAMINOPHEN 325 MG PO TABS
650.0000 mg | ORAL_TABLET | Freq: Four times a day (QID) | ORAL | Status: DC | PRN
Start: 2016-04-21 — End: 2016-04-25

## 2016-04-21 MED ORDER — INSULIN ASPART 100 UNIT/ML ~~LOC~~ SOLN
0.0000 [IU] | Freq: Three times a day (TID) | SUBCUTANEOUS | Status: DC
  Administered 2016-04-21: 1 [IU] via SUBCUTANEOUS
  Administered 2016-04-22 – 2016-04-23 (×3): 2 [IU] via SUBCUTANEOUS
  Administered 2016-04-24: 1 [IU] via SUBCUTANEOUS
  Administered 2016-04-24: 3 [IU] via SUBCUTANEOUS
  Administered 2016-04-24: 2 [IU] via SUBCUTANEOUS
  Administered 2016-04-25: 1 [IU] via SUBCUTANEOUS

## 2016-04-21 MED ORDER — ASPIRIN 325 MG PO TABS
325.0000 mg | ORAL_TABLET | Freq: Every day | ORAL | Status: DC
  Administered 2016-04-21 – 2016-04-25 (×5): 325 mg via ORAL
  Filled 2016-04-21 (×6): qty 1

## 2016-04-21 MED ORDER — ONDANSETRON HCL 4 MG/2ML IJ SOLN: 4.0000 mg | Freq: Four times a day (QID) | INTRAMUSCULAR | Status: DC | PRN

## 2016-04-21 MED ORDER — FUROSEMIDE 10 MG/ML IJ SOLN
40.0000 mg | Freq: Once | INTRAMUSCULAR | Status: AC
  Administered 2016-04-21: 40 mg via INTRAVENOUS
  Filled 2016-04-21: qty 4

## 2016-04-21 MED ORDER — ACETAMINOPHEN 650 MG RE SUPP: 650.0000 mg | Freq: Four times a day (QID) | RECTAL | Status: DC | PRN

## 2016-04-21 MED ORDER — HYDRALAZINE HCL 20 MG/ML IJ SOLN: 10.0000 mg | INTRAMUSCULAR | Status: DC | PRN

## 2016-04-21 MED ORDER — LISINOPRIL 5 MG PO TABS
5.0000 mg | ORAL_TABLET | Freq: Every day | ORAL | Status: DC
  Administered 2016-04-21 – 2016-04-22 (×2): 5 mg via ORAL
  Filled 2016-04-21 (×2): qty 1

## 2016-04-21 MED ORDER — ONDANSETRON HCL 4 MG PO TABS: 4.0000 mg | ORAL_TABLET | Freq: Four times a day (QID) | ORAL | Status: DC | PRN

## 2016-04-21 MED ORDER — GLIPIZIDE ER 10 MG PO TB24
10.0000 mg | ORAL_TABLET | Freq: Every day | ORAL | Status: DC
  Administered 2016-04-21 – 2016-04-25 (×4): 10 mg via ORAL
  Filled 2016-04-21 (×5): qty 1

## 2016-04-21 MED ORDER — DEXTROSE 5 % IV SOLN
500.0000 mg | Freq: Every day | INTRAVENOUS | Status: DC
  Administered 2016-04-21 – 2016-04-22 (×2): 500 mg via INTRAVENOUS
  Filled 2016-04-21 (×2): qty 500

## 2016-04-21 MED ORDER — HYDRALAZINE HCL 20 MG/ML IJ SOLN
10.0000 mg | Freq: Once | INTRAMUSCULAR | Status: AC
  Administered 2016-04-21: 10 mg via INTRAVENOUS
  Filled 2016-04-21: qty 1

## 2016-04-21 MED ORDER — HYDRALAZINE HCL 20 MG/ML IJ SOLN
10.0000 mg | Freq: Once | INTRAMUSCULAR | Status: DC
  Filled 2016-04-21: qty 1

## 2016-04-21 MED ORDER — HYDRALAZINE HCL 25 MG PO TABS
25.0000 mg | ORAL_TABLET | Freq: Three times a day (TID) | ORAL | Status: DC
  Administered 2016-04-21 – 2016-04-22 (×3): 25 mg via ORAL
  Filled 2016-04-21 (×3): qty 1

## 2016-04-21 NOTE — Progress Notes (Signed)
At 17:45 o'clock, patient's CBG=76. Patient alert and oriented, no acute distress. He drank orange juice and his CBG came up to 188. Will continue to monitor.

## 2016-04-21 NOTE — Progress Notes (Signed)
  Echocardiogram 2D Echocardiogram has been performed.  Christopher Roth 04/21/2016, 4:03 PM

## 2016-04-21 NOTE — Care Management Note (Addendum)
Case Management Note  Patient Details  Name: Christopher Roth MRN: [id number] Date of Birth: 02/04/1971  Subjective/Objective:                 Patient from home with wife, independent. Treated for PNA, new CHF, echo showed EF of 25%, likely need cardiac cath prior to DC.    Action/Plan:  CM will continue to follow.   Expected Discharge Date:  04/23/16               Expected Discharge Plan:  Home/Self Care  In-House Referral:     Discharge planning Services  CM Consult  Post Acute Care Choice:    Choice offered to:     DME Arranged:    DME Agency:     HH Arranged:    HH Agency:     Status of Service:  In process, will continue to follow  If discussed at Long Length of Stay Meetings, dates discussed:    Additional Comments:  Carles Collet, RN 04/21/2016, 4:42 PM

## 2016-04-21 NOTE — ED Provider Notes (Signed)
I assumed care in signout to f/u on imaging Pt with multifocal pneumonia He is awake/alert, no distress, resting comfortably Will admit Discussed findings with patient D/w dr Hal Hope for admission    Ripley Fraise, MD 04/21/16 [phone]

## 2016-04-21 NOTE — H&P (Addendum)
History and Physical    Christopher Roth FWY:[id number] DOB: 22-Jan-1971 DOA: 04/20/2016  PCP: Helane Rima, MD  Patient coming from: Home.  Chief Complaint: Shortness of breath.  HPI: Christopher Roth is a 45 y.o. male with history of diabetes mellitus type 2, hypertension presently off medications, tobacco abuse quit 2 weeks ago presents to the ER because of shortness of breath. Patient has been having productive cough for last couple of months and has been on at least on 3 courses of antibiotics last one was finished yesterday. Patient has been having shortness of breath mostly on lying down with some chest tightness off and on. Presently chest pain-free. In the ER just x-ray was showing infiltrates followed by CT chest done with contrast shows multifocal pneumonia with bilateral pleural effusion and nodules and pulmonary hypertension. Patient's blood pressure also was elevated on admission. Troponin is mildly elevated. Patient is being admitted for respiratory failure probably from pneumonia with possible CHF.   ED Course: Was started empiric antibiotics for pneumonia. CT chest was showing multifocal pneumonia with pulmonary hypertension and bilateral pleural effusion with nodules.  Review of Systems: As per HPI, rest all negative.   Past Medical History:  Diagnosis Date  . Diabetes mellitus without complication ([place])   . Hypertension   . Tobacco use disorder 11/21/2014    Past Surgical History:  Procedure Laterality Date  . INCISION AND DRAINAGE PERIRECTAL ABSCESS Right 11/17/2014   Procedure: debridement of right perianal and groin wound;  Surgeon: Johnathan Hausen, MD;  Location: [facility];  Service: General;  Laterality: Right;     reports that he has been smoking Cigarettes.  He has a 24.00 pack-year smoking history. He has never used smokeless tobacco. He reports that he drinks alcohol. He reports that he does not use drugs.  Allergies  Allergen Reactions  . Januvia [Sitagliptin]  Hives    Family History  Problem Relation Age of Onset  . Diabetes Mellitus II Mother     Prior to Admission medications   Medication Sig Start Date End Date Taking? Authorizing Provider  albuterol (PROVENTIL HFA;VENTOLIN HFA) 108 (90 Base) MCG/ACT inhaler Inhale 2 puffs into the lungs every 6 (six) hours as needed for wheezing or shortness of breath.   Yes Historical Provider, MD  Alcohol Swabs PADS 1 Package by Does not apply route as needed (with blood glucose checks). 11/21/14  Yes Robbie Lis, MD  glipiZIDE (GLUCOTROL XL) 10 MG 24 hr tablet Take 10 mg by mouth daily with breakfast.   Yes Historical Provider, MD  glucose blood (CHOICE DM FORA G20 TEST STRIPS) test strip Use as instructed 11/21/14  Yes Robbie Lis, MD  glucose monitoring kit (FREESTYLE) monitoring kit 1 each by Does not apply route as needed for other. 11/21/14  Yes Robbie Lis, MD  guaiFENesin-codeine 100-10 MG/5ML syrup Take 10 mLs by mouth 3 (three) times daily as needed for cough.   Yes Historical Provider, MD  Lancets (FREESTYLE) lancets Use as instructed 11/21/14  Yes Robbie Lis, MD  metFORMIN (GLUCOPHAGE-XR) 500 MG 24 hr tablet Take 1,000 mg by mouth 2 (two) times daily.   Yes Historical Provider, MD    Physical Exam: Vitals:   04/21/16 0200 04/21/16 0230 04/21/16 0242 04/21/16 0307  BP: (!) 161/113 151/97 154/95 (!) 190/109  Pulse: 89  92 97  Resp: 23 20 21    Temp:    98.1 F (36.7 C)  TempSrc:    Oral  SpO2: 92%  96%  97%  Weight:      Height:          Constitutional: Moderately built and nourished. Vitals:   04/21/16 0200 04/21/16 0230 04/21/16 0242 04/21/16 0307  BP: (!) 161/113 151/97 154/95 (!) 190/109  Pulse: 89  92 97  Resp: 23 20 21    Temp:    98.1 F (36.7 C)  TempSrc:    Oral  SpO2: 92%  96% 97%  Weight:      Height:       Eyes: Anicteric. No pallor. ENMT: No discharge from the ears eyes nose and mouth. Neck: No mass felt. No neck rigidity. No JVD appreciated. Respiratory:  No rhonchi or crepitations. Cardiovascular: S1 and S2 heard. No murmurs appreciated. Abdomen: Soft nontender bowel sounds present. No guarding or rigidity. Musculoskeletal: No edema. No joint effusion. Skin: Skin appears warm. No rash. Neurologic: Alert awake oriented to time place and person. Moves all extremities 5 x 5. No facial asymmetry. Tongue was midline. Psychiatric: Appears normal. Normal affect.   Labs on Admission: I have personally reviewed following labs and imaging studies  CBC:  Recent Labs Lab 04/20/16 1906  WBC 9.4  HGB 12.6*  HCT 36.2*  MCV 84.0  PLT 675*   Basic Metabolic Panel:  Recent Labs Lab 04/20/16 1906  NA 138  K 3.8  CL 106  CO2 24  GLUCOSE 148*  BUN 6  CREATININE 1.00  CALCIUM 9.3   GFR: Estimated Creatinine Clearance: 104.2 mL/min (by C-G formula based on SCr of 1 mg/dL). Liver Function Tests: No results for input(s): AST, ALT, ALKPHOS, BILITOT, PROT, ALBUMIN in the last 168 hours. No results for input(s): LIPASE, AMYLASE in the last 168 hours. No results for input(s): AMMONIA in the last 168 hours. Coagulation Profile: No results for input(s): INR, PROTIME in the last 168 hours. Cardiac Enzymes: No results for input(s): CKTOTAL, CKMB, CKMBINDEX, TROPONINI in the last 168 hours. BNP (last 3 results) No results for input(s): PROBNP in the last 8760 hours. HbA1C: No results for input(s): HGBA1C in the last 72 hours. CBG:  Recent Labs Lab 04/21/16 0315  GLUCAP 161*   Lipid Profile: No results for input(s): CHOL, HDL, LDLCALC, TRIG, CHOLHDL, LDLDIRECT in the last 72 hours. Thyroid Function Tests: No results for input(s): TSH, T4TOTAL, FREET4, T3FREE, THYROIDAB in the last 72 hours. Anemia Panel: No results for input(s): VITAMINB12, FOLATE, FERRITIN, TIBC, IRON, RETICCTPCT in the last 72 hours. Urine analysis:    Component Value Date/Time   COLORURINE LT YELLOW 08/04/2007 1447   APPEARANCEUR Clear 08/04/2007 1447   LABSPEC  1.010 08/04/2007 1447   PHURINE 5.5 08/04/2007 1447   GLUCOSEU > or = 1000 mg/dL (AA) 08/04/2007 1447   BILIRUBINUR NEGATIVE 08/04/2007 1447   KETONESUR NEGATIVE 08/04/2007 1447   UROBILINOGEN 0.2 mg/dL 08/04/2007 1447   NITRITE Negative 08/04/2007 1447   LEUKOCYTESUR Negative 08/04/2007 1447   Sepsis Labs: @LABRCNTIP (procalcitonin:4,lacticidven:4) )No results found for this or any previous visit (from the past 240 hour(s)).   Radiological Exams on Admission: Dg Chest 2 View  Result Date: 04/20/2016 CLINICAL DATA:  Pt has had SOB and left sided chest pain for the last week. Pt also c/o dry cough. Pt has a hx of HTN, diabetes and is a recent former smoker. EXAM: CHEST  2 VIEW COMPARISON:  None. FINDINGS: Heart size is mildly enlarged. There are patchy infiltrates involving the lower lobes, lingula, and right middle lobe. Small bilateral pleural effusions are suspected. No evidence for pulmonary edema.  IMPRESSION: Multifocal infiltrates. Electronically Signed   By: Nolon Nations M.D.   On: 04/20/2016 20:13   Ct Chest W Contrast  Result Date: 04/21/2016 CLINICAL DATA:  Shortness of breath and cough for 1 week. Mild left-sided chest pain. EXAM: CT CHEST WITH CONTRAST TECHNIQUE: Multidetector CT imaging of the chest was performed during intravenous contrast administration. CONTRAST:  34m ISOVUE-300 IOPAMIDOL (ISOVUE-300) INJECTION 61% COMPARISON:  Chest radiograph 04/20/2016 FINDINGS: Cardiovascular: There is coronary artery atherosclerosis. Heart size is normal without pericardial effusion. The main pulmonary artery measures 3.6 cm at the bifurcation. The thoracic aorta is normal. Proximal arch vessels are patent. Mediastinum/Nodes: Pretracheal lymph nodes measure up to 1.5 cm. The mediastinum is otherwise unremarkable. Lungs/Pleura: There are medium-sized bilateral pleural effusions. There are multifocal patchy opacities within both lungs, slightly worse on the left. There is multifocal  subsegmental atelectasis nodule in the right upper lobe measures 5 mm. Left upper lobe nodule measures 7 mm. No masses are visualized. No focal hilar abnormality. Central airways are patent. Upper Abdomen: No acute abnormality. Musculoskeletal: No chest wall abnormality. No acute or significant osseous findings. IMPRESSION: 1. Medium-sized bilateral pleural effusions and multifocal patchy airspace opacities, suggesting multifocal pneumonia. 2. No discrete pulmonary masses. Small bilateral upper lobe pulmonary nodules, measuring up to 7 mm. These are favored to be part of the same inflammatory/infectious process. However, non-contrast chest CT at 3-6 months is recommended. If the nodules are stable at time of repeat CT, then future CT at 18-24 months (from today's scan) is considered optional for low-risk patients, but is recommended for high-risk patients. This recommendation follows the consensus statement: Guidelines for Management of Incidental Pulmonary Nodules Detected on CT Images: From the Fleischner Society 2017; Radiology 2017; 284:228-243. 3. Enlarged main pulmonary artery may indicate underlying pulmonary hypertension. 4. Coronary artery atherosclerosis. Electronically Signed   By: KUlyses JarredM.D.   On: 04/21/2016 00:11    EKG: Independently reviewed. Sinus tachycardia with RBBB.  Assessment/Plan Principal Problem:   Community acquired pneumonia Active Problems:   Essential hypertension   Diabetes [place] II, uncontrolled [place]   Elevated troponin   Pneumonia    1. Acute respiratory failure with hypoxia - primary suspect Multifocal pneumonia. However patient has elevated blood pressure and bilateral pleural effusion with patient having increasing shortness of breath on lying down concerning for CHF. I have ordered a BNP and if elevated I will give Lasix. Check 2-D echo cycle cardiac markers. We'll also get d-dimer since patient has pulmonary hypertension. Check HIV  status. 2. Hypertension uncontrolled - probably contributing to #1 symptom. Patient states he used to be on lisinopril which was discontinued 6 months ago by his primary care. I'm restarting patient on lisinopril 5 mg by mouth daily. Closely follow blood pressure trends. When necessary IV hydralazine for systolic blood pressure more than 160. 3. Chest tightness - cycle cardiac markers checked 2-D echo aspirin check d-dimer. 4. Diabetes mellitus type 2 - patient states his last hemoglobin A1c was 9 and was recently started on glipizide. Hold metformin while inpatient. Sliding scale coverage. 5. Tobacco abuse - patient states he quit smoking 2 weeks ago. 6. Pulmonary nodule - will need close follow-up as outpatient.   DVT prophylaxis: Lovenox. Code Status: Full code.  Family Communication: Discussed with patient.  Disposition Plan: Home.  Consults called: None.  Admission status: Inpatient.    KRise PatienceMD [facility] Pager [phone]  If 7PM-7AM, please contact night-coverage [email or web address] Password [email or web address]  04/21/2016, 4:15 AM

## 2016-04-21 NOTE — ED Notes (Signed)
Attempted report to 5w. Spoke to nurse and charge nurse to verify the patient is appropriate for their floor. They are going to page the admitting physician and will notify RN

## 2016-04-21 NOTE — Progress Notes (Signed)
[facility] PROGRESS NOTE  Everhett Bozard SPQ:[id number] DOB: [birth year] DOA: 04/20/2016  PCP: Helane Rima, MD  Brief History/Interval Summary: 45 y.o. male with history of diabetes mellitus type 2, hypertension presently off medications, tobacco abuse quit 2 weeks ago presented to the ER because of shortness of breath. Patient has been having productive cough for last couple of months and has been on at least on 3 courses of antibiotics. He finished a course recently. Patient was found to have multifocal pneumonia on CT scan. Bilateral pleural effusions were also noted. Patient was hospitalized for further management.  Reason for Visit: Multifocal pneumonia. Possible fluid overload  Consultants: None yet  Procedures: Transthoracic echocardiogram is pending  Antibiotics: Ceftriaxone and azithromycin  Subjective/Interval History: Patient feels better this morning. He states that he urinated a lot after he was given Lasix this morning. And that has improved his symptoms. He denies any chest pain. No swelling of his legs. Denies any history of heart disease  ROS: Denies any nausea or vomiting  Objective:  Vital Signs  Vitals:   04/21/16 0242 04/21/16 0307 04/21/16 0320 04/21/16 0552  BP: 154/95 (!) 190/109 (!) 171/102 (!) 162/97  Pulse: 92 97  91  Resp: 21   20  Temp:  98.1 F (36.7 C)  98.1 F (36.7 C)  TempSrc:  Oral  Oral  SpO2: 96% 97%  92%  Weight:      Height:        Intake/Output Summary (Last 24 hours) at 04/21/16 1258 Last data filed at 04/21/16 3335  Gross per 24 hour  Intake              360 ml  Output                0 ml  Net              360 ml   Filed Weights   04/20/16 1901  Weight: 94.8 kg (209 lb)    General appearance: alert, cooperative, appears stated age and no distress Resp: Good air entry bilaterally. Few crackles at the bases. Some dullness to percussion at the bases. Cardio: regular rate and rhythm, S1, S2 normal, no murmur,  click, rub or gallop GI: soft, non-tender; bowel sounds normal; no masses,  no organomegaly Extremities: extremities normal, atraumatic, no cyanosis or edema Neurologic: Alert and oriented X 3, no focal deficits  Lab Results:  Data Reviewed: I have personally reviewed following labs and imaging studies  CBC:  Recent Labs Lab 04/20/16 1906 04/21/16 0457  WBC 9.4 9.3  NEUTROABS  --  6.4  HGB 12.6* 12.9*  HCT 36.2* 37.8*  MCV 84.0 83.4  PLT 442* 401*    Basic Metabolic Panel:  Recent Labs Lab 04/20/16 1906 04/21/16 0457  NA 138 138  K 3.8 3.6  CL 106 105  CO2 24 26  GLUCOSE 148* 164*  BUN 6 7  CREATININE 1.00 0.92  CALCIUM 9.3 9.4    GFR: Estimated Creatinine Clearance: 113.3 mL/min (by C-G formula based on SCr of 0.92 mg/dL).  Liver Function Tests:  Recent Labs Lab 04/21/16 0457  AST 17  ALT 23  ALKPHOS 70  BILITOT 1.1  PROT 6.7  ALBUMIN 3.8    Cardiac Enzymes:  Recent Labs Lab 04/21/16 0457 04/21/16 1017  TROPONINI 0.15* 0.14*    CBG:  Recent Labs Lab 04/21/16 0315 04/21/16 0832 04/21/16 1239  GLUCAP 161* 139* 108*    Thyroid Function Tests:  Recent Labs  04/21/16 0457  TSH 1.387     Radiology Studies: Dg Chest 2 View  Result Date: 04/20/2016 CLINICAL DATA:  Pt has had SOB and left sided chest pain for the last week. Pt also c/o dry cough. Pt has a hx of HTN, diabetes and is a recent former smoker. EXAM: CHEST  2 VIEW COMPARISON:  None. FINDINGS: Heart size is mildly enlarged. There are patchy infiltrates involving the lower lobes, lingula, and right middle lobe. Small bilateral pleural effusions are suspected. No evidence for pulmonary edema. IMPRESSION: Multifocal infiltrates. Electronically Signed   By: Nolon Nations M.D.   On: 04/20/2016 20:13   Ct Chest W Contrast  Result Date: 04/21/2016 CLINICAL DATA:  Shortness of breath and cough for 1 week. Mild left-sided chest pain. EXAM: CT CHEST WITH CONTRAST TECHNIQUE:  Multidetector CT imaging of the chest was performed during intravenous contrast administration. CONTRAST:  41m ISOVUE-300 IOPAMIDOL (ISOVUE-300) INJECTION 61% COMPARISON:  Chest radiograph 04/20/2016 FINDINGS: Cardiovascular: There is coronary artery atherosclerosis. Heart size is normal without pericardial effusion. The main pulmonary artery measures 3.6 cm at the bifurcation. The thoracic aorta is normal. Proximal arch vessels are patent. Mediastinum/Nodes: Pretracheal lymph nodes measure up to 1.5 cm. The mediastinum is otherwise unremarkable. Lungs/Pleura: There are medium-sized bilateral pleural effusions. There are multifocal patchy opacities within both lungs, slightly worse on the left. There is multifocal subsegmental atelectasis nodule in the right upper lobe measures 5 mm. Left upper lobe nodule measures 7 mm. No masses are visualized. No focal hilar abnormality. Central airways are patent. Upper Abdomen: No acute abnormality. Musculoskeletal: No chest wall abnormality. No acute or significant osseous findings. IMPRESSION: 1. Medium-sized bilateral pleural effusions and multifocal patchy airspace opacities, suggesting multifocal pneumonia. 2. No discrete pulmonary masses. Small bilateral upper lobe pulmonary nodules, measuring up to 7 mm. These are favored to be part of the same inflammatory/infectious process. However, non-contrast chest CT at 3-6 months is recommended. If the nodules are stable at time of repeat CT, then future CT at 18-24 months (from today's scan) is considered optional for low-risk patients, but is recommended for high-risk patients. This recommendation follows the consensus statement: Guidelines for Management of Incidental Pulmonary Nodules Detected on CT Images: From the Fleischner Society 2017; Radiology 2017; 284:228-243. 3. Enlarged main pulmonary artery may indicate underlying pulmonary hypertension. 4. Coronary artery atherosclerosis. Electronically Signed   By: KUlyses JarredM.D.   On: 04/21/2016 00:11     Medications:  Scheduled: . azithromycin  500 mg Intravenous QHS  . cefTRIAXone (ROCEPHIN)  IV  1 g Intravenous QHS  . enoxaparin (LOVENOX) injection  40 mg Subcutaneous Q24H  . furosemide  40 mg Intravenous Once  . glipiZIDE  10 mg Oral Q breakfast  . hydrALAZINE  10 mg Intravenous Once  . insulin aspart  0-9 Units Subcutaneous TID WC  . lisinopril  5 mg Oral Daily   Continuous:  PVOZ:[id number]**OR** acetaminophen, hydrALAZINE, ondansetron **OR** ondansetron (ZOFRAN) IV  Assessment/Plan:  Principal Problem:   Community acquired pneumonia Active Problems:   Essential hypertension   Diabetes mellitus type II, uncontrolled (HCC)   Elevated troponin   Pneumonia    Acute respiratory failure with hypoxia. Etiology is thought to be multifocal pneumonia noted on CT scan. Patient, however, also noted to have bilateral pleural effusion. He had elevated BNP. There could be an element of fluid overload as well. Mild cardiomegaly noted on x-ray. His EKG is abnormal. He denies any history of heart disease. Patient has been  given Lasix with good diuresis and improvement in his symptoms. Oxygen as needed.  Multifocal pneumonia Patient has failed outpatient treatment. Continue ceftriaxone and azithromycin for now, although I suspect his symptoms may be due to cardiogenic etiology. Influenza PCR is negative.  Accelerated hypertension Blood pressure is poorly controlled. Patient has been started back on lisinopril. Hydralazine as needed. Continue to monitor.  Chest tightness with minimal troponin elevation and abnormal EKG Denies any discomfort this morning. Follow-up on echocardiogram. Troponins noted to be minimally elevated. D-dimer was high, but CT scan which was done with contrast did not show any PE. Echo is pending, however, I think he will need further cardiac testing anyway. [facility] consult cardiology. Defer heparin to cardiology. Will give  aspirin. Check lipid panel.  Diabetes mellitus type 2. Last HbA1c was 9. Recently started on glipizide. Also on metformin, which is being held. Sliding scale insulin coverage.  History of tobacco abuse. Apparently, patient quit 2 weeks ago. Continue to emphasize cessation.  Bilateral pulmonary nodules. Detected on CT scan. Will need close outpatient monitoring.  DVT Prophylaxis: Lovenox    Code Status: Full code  Family Communication: Discussed with the patient  Disposition Plan: Management as outlined above. [facility] consult cardiology.   LOS: 0 days   [facility] Pager [phone] 04/21/2016, 12:58 PM  If 7PM-7AM, please contact night-coverage at [email or web address], password [facility]

## 2016-04-21 NOTE — Consult Note (Signed)
Cardiology Consult    Patient ID: Christopher Roth MRN: [id number], DOB/AGE: Apr 13, 1971   Admit date: 04/20/2016 Date of Consult: 04/21/2016  Primary Physician: Helane Rima, MD Primary Cardiologist: New Requesting Provider: Dr. [place] Pink Reason for Consultation: Elevated Trop  Patient Profile    45 yo male with PMH of HTN, NIDDM, Tobacco use who presented to the ED with reports of increased dyspnea and productive cough for the past couple of weeks.   Past Medical History   Past Medical History:  Diagnosis Date  . Diabetes mellitus without complication ([place])   . Hypertension   . Tobacco use disorder 11/21/2014    Past Surgical History:  Procedure Laterality Date  . INCISION AND DRAINAGE PERIRECTAL ABSCESS Right 11/17/2014   Procedure: debridement of right perianal and groin wound;  Surgeon: Johnathan Hausen, MD;  Location: [facility];  Service: General;  Laterality: Right;     Allergies  Allergies  Allergen Reactions  . Januvia [Sitagliptin] Hives    History of Present Illness    [name] is a 45 yo male with PMH HTN, NIDDM, Tobacco use. Denies ever having been seen by cardiology in the past. Reports he is normally very active at home and does not normally have any anginal symptoms or dyspnea on exertion. States he was being followed in the outpatient setting by his PCP and has taken 3 different courses of antibiotics. Reports he was dx with DM about 7 years ago, and stopped smoking about 2 weeks ago. Denies any significant family hx of CAD.   Presented to the ED with worsening dyspnea. CXR in the ED showed infiltrates, and follow up CT scan showed multifocal PNA with bilateral pleural effusion and nodules. Also noted new pulmonary nodules measuring up to 7 mm, and reported coronary atherosclerosis. Labs on admission showed stable electrolytes, BNP 915, Trop 0.18>>0.15>>0.14. Ddimer 1.32, TSH normal. EKG on admission showed SR with RBBB with TWI and Q waves in anterioseptal leads.  He was admitted for further management.   Inpatient Medications    . aspirin  325 mg Oral Daily  . azithromycin  500 mg Intravenous QHS  . cefTRIAXone (ROCEPHIN)  IV  1 g Intravenous QHS  . enoxaparin (LOVENOX) injection  40 mg Subcutaneous Q24H  . furosemide  40 mg Intravenous Once  . glipiZIDE  10 mg Oral Q breakfast  . hydrALAZINE  10 mg Intravenous Once  . insulin aspart  0-9 Units Subcutaneous TID WC  . lisinopril  5 mg Oral Daily    Family History    Family History  Problem Relation Age of Onset  . Diabetes Mellitus II Mother     Social History    Social History   Social History  . Marital status: Married    Spouse name: N/A  . Number of children: N/A  . Years of education: N/A   Occupational History  . Not on file.   Social History Main Topics  . Smoking status: Current Every Day Smoker    Packs/day: 1.00    Years: 24.00    Types: Cigarettes  . Smokeless tobacco: Never Used  . Alcohol use Yes  . Drug use: No  . Sexual activity: Not on file   Other Topics Concern  . Not on file   Social History Narrative  . No narrative on file     Review of Systems    General:  No chills, fever, night sweats or weight changes.  Cardiovascular:  ++ chest pain, dyspnea on exertion, edema,  orthopnea, palpitations, paroxysmal nocturnal dyspnea. Dermatological: No rash, lesions/masses Respiratory: See HPI Urologic: No hematuria, dysuria Abdominal:   No nausea, vomiting, diarrhea, bright red blood per rectum, melena, or hematemesis Neurologic:  No visual changes, wkns, changes in mental status. All other systems reviewed and are otherwise negative except as noted above.  Physical Exam    Blood pressure (!) 162/97, pulse 91, temperature 98.1 F (36.7 C), temperature source Oral, resp. rate 20, height 5' 8"  (1.727 m), weight 209 lb (94.8 kg), SpO2 92 %.  General: Pleasant, NAD Psych: Normal affect. Neuro: Alert and oriented X 3. Moves all extremities  spontaneously. HEENT: Normal  Neck: Supple without bruits or JVD. Lungs:  Resp regular and unlabored, Slightly diminished bilaterally, mild crackles. Heart: RRR no s3, s4, or murmurs. Abdomen: Soft, non-tender, non-distended, BS + x 4.  Extremities: No clubbing, cyanosis or edema. DP/PT/Radials 2+ and equal bilaterally.  Labs    Troponin [facility] Test)  Recent Labs  04/20/16 2023  TROPIPOC 0.18*    Recent Labs  04/21/16 0457 04/21/16 1017  TROPONINI 0.15* 0.14*   Lab Results  Component Value Date   WBC 9.3 04/21/2016   HGB 12.9 (L) 04/21/2016   HCT 37.8 (L) 04/21/2016   MCV 83.4 04/21/2016   PLT 401 (H) 04/21/2016    Recent Labs Lab 04/21/16 0457  NA 138  K 3.6  CL 105  CO2 26  BUN 7  CREATININE 0.92  CALCIUM 9.4  PROT 6.7  BILITOT 1.1  ALKPHOS 70  ALT 23  AST 17  GLUCOSE 164*   Lab Results  Component Value Date   CHOL 171 11/16/2007   HDL 41.8 11/16/2007   LDLCALC 121 (H) 11/16/2007   TRIG 40 11/16/2007   Lab Results  Component Value Date   DDIMER 1.32 (H) 04/21/2016     Radiology Studies    Dg Chest 2 View  Result Date: 04/20/2016 CLINICAL DATA:  Pt has had SOB and left sided chest pain for the last week. Pt also c/o dry cough. Pt has a hx of HTN, diabetes and is a recent former smoker. EXAM: CHEST  2 VIEW COMPARISON:  None. FINDINGS: Heart size is mildly enlarged. There are patchy infiltrates involving the lower lobes, lingula, and right middle lobe. Small bilateral pleural effusions are suspected. No evidence for pulmonary edema. IMPRESSION: Multifocal infiltrates. Electronically Signed   By: Nolon Nations M.D.   On: 04/20/2016 20:13   Ct Chest W Contrast  Result Date: 04/21/2016 CLINICAL DATA:  Shortness of breath and cough for 1 week. Mild left-sided chest pain. EXAM: CT CHEST WITH CONTRAST TECHNIQUE: Multidetector CT imaging of the chest was performed during intravenous contrast administration. CONTRAST:  45m ISOVUE-300 IOPAMIDOL  (ISOVUE-300) INJECTION 61% COMPARISON:  Chest radiograph 04/20/2016 FINDINGS: Cardiovascular: There is coronary artery atherosclerosis. Heart size is normal without pericardial effusion. The main pulmonary artery measures 3.6 cm at the bifurcation. The thoracic aorta is normal. Proximal arch vessels are patent. Mediastinum/Nodes: Pretracheal lymph nodes measure up to 1.5 cm. The mediastinum is otherwise unremarkable. Lungs/Pleura: There are medium-sized bilateral pleural effusions. There are multifocal patchy opacities within both lungs, slightly worse on the left. There is multifocal subsegmental atelectasis nodule in the right upper lobe measures 5 mm. Left upper lobe nodule measures 7 mm. No masses are visualized. No focal hilar abnormality. Central airways are patent. Upper Abdomen: No acute abnormality. Musculoskeletal: No chest wall abnormality. No acute or significant osseous findings. IMPRESSION: 1. Medium-sized bilateral pleural effusions and  multifocal patchy airspace opacities, suggesting multifocal pneumonia. 2. No discrete pulmonary masses. Small bilateral upper lobe pulmonary nodules, measuring up to 7 mm. These are favored to be part of the same inflammatory/infectious process. However, non-contrast chest CT at 3-6 months is recommended. If the nodules are stable at time of repeat CT, then future CT at 18-24 months (from today's scan) is considered optional for low-risk patients, but is recommended for high-risk patients. This recommendation follows the consensus statement: Guidelines for Management of Incidental Pulmonary Nodules Detected on CT Images: From the Fleischner Society 2017; Radiology 2017; 284:228-243. 3. Enlarged main pulmonary artery may indicate underlying pulmonary hypertension. 4. Coronary artery atherosclerosis. Electronically Signed   By: Ulyses Jarred M.D.   On: 04/21/2016 00:11    ECG & Cardiac Imaging    EKG: SR with RBBB with TWI and Q waves in anterioseptal leads  Echo:  Pending  Assessment & Plan    45 yo male with PMH of HTN, NIDDM, Tobacco use who presented to the ED with reports of increased dyspnea and productive cough for the past couple of weeks.  1. Elevated Trop: Noted to have a mild flat trend in the setting of elevated BNP and multifocal PNA. Denies any significant anginal symptoms prior to admission. Does have RF including HTN, NIDDM, Tobacco use. CT chest did note coronary atherosclerosis. EKG showed SR with RBBB and TWI/Q waves in anteroseptal leads, no prior to compare.  -- 2 D echo pending at this time, further work up pending results.   2. Multifocal PNA: Noted on admission, after failing multiple outpatient courses of antibiotics. Treatment per primary.  3. HTN: Reports he was on lisinopril at one time but was stopped by his PCP? Hypertensive on arrival. Was restarted on lisinopril 75m, but blood pressures remain elevated. Would recommend starting on hydralazine and nitrates for better control. Will add hydralazine [id number] today and follow.   4. New HF: No echo on file. BNP elevated on admission. Echo pending. Would continue with IV lasix   5. NIDDM: Reports he was dx about 7 years ago. Hgb A1c 7/16 was 12.3, but reported his most recent was 9.  6. Bilateral Pulmonary nodules: Found on CT scan this admission. Long hx of tobacco use  Signed, Christopher BellisNP-C Pager [phone]/18/2017, 1:58 PM  As above, patient seen and examined. Briefly he is a [age]ear old male with past medical history of hypertension, diabetes mellitus and tobacco abuse who we are asked to evaluate for an abnormal troponin, abnormal electrocardiogram and congestive heart failure. Over the preceding 2 weeks he notes increasing dyspnea on exertion and orthopnea. He denies fevers, chills or productive cough. There is no chest pain. Chest CT shows pleural effusions, coronary atherosclerosis and enlarged pulmonary arteries. There are diffuse infiltrates. Troponin  0.14. BNP 915. ECG shows sinus with pvc; RBBB; LPFB; septal MI.   1 acute congestive heart failure-patient's presentation is most consistent with CHF. LV function is unknown. He was severely hypertensive at time of admission. 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 He is also diabetic, there is coronary calcification on CT and possible septal infarct on electrocardiogram. Ischemia may be possible. Plan echocardiogram to assess LV function. If reduced he will need cardiac catheterization. If LV function is reduced we will treat with beta blockade as tolerated by pulse and blood pressure as well as ACE inhibitor. Continue present dose of Lasix and follow renal function.   2 severe hypertension-continue present medications and adjust as needed to control blood  pressure. We discussed the importance of blood pressure control.   3 question pneumonia-antibiotics per primary care.  4 diabetes mellitus  5 elevated troponin-there is no clear trend and he is not having chest pain consistent with acute coronary syndrome. Further evaluation based on LV function on echocardiogram.  6 abnormal electrocardiogram-plan as outlined under #1.  Christopher Ruths, MD

## 2016-04-22 ENCOUNTER — Inpatient Hospital Stay (HOSPITAL_COMMUNITY): Payer: 59

## 2016-04-22 ENCOUNTER — Encounter (HOSPITAL_COMMUNITY): Payer: Self-pay | Admitting: Internal Medicine

## 2016-04-22 DIAGNOSIS — I5021 Acute systolic (congestive) heart failure: Secondary | ICD-10-CM

## 2016-04-22 DIAGNOSIS — I5022 Chronic systolic (congestive) heart failure: Secondary | ICD-10-CM | POA: Diagnosis present

## 2016-04-22 HISTORY — DX: Acute systolic (congestive) heart failure: I50.21

## 2016-04-22 LAB — BASIC METABOLIC PANEL
Anion gap: 10 (ref 5–15)
BUN: 8 mg/dL (ref 6–20)
CO2: 25 mmol/L (ref 22–32)
CREATININE: 0.97 mg/dL (ref 0.61–1.24)
Calcium: 9 mg/dL (ref 8.9–10.3)
Chloride: 105 mmol/L (ref 101–111)
GFR calc Af Amer: >60 mL/min (ref >60)
Glucose, Bld: 126 mg/dL — ABNORMAL HIGH (ref 65–99)
POTASSIUM: 3.3 mmol/L — AB (ref 3.5–5.1)
SODIUM: 140 mmol/L (ref 135–145)

## 2016-04-22 LAB — CBC
HCT: 34.3 % — ABNORMAL LOW (ref 39.0–52.0)
Hemoglobin: 11.8 g/dL — ABNORMAL LOW (ref 13.0–17.0)
MCH: 28.4 pg (ref 26.0–34.0)
MCHC: 34.4 g/dL (ref 30.0–36.0)
MCV: 82.7 fL (ref 78.0–100.0)
PLATELETS: 351 10*3/uL (ref 150–400)
RBC: 4.15 MIL/uL — AB (ref 4.22–5.81)
RDW: 14.1 % (ref 11.5–15.5)
WBC: 6.6 10*3/uL (ref 4.0–10.5)

## 2016-04-22 LAB — LIPID PANEL
CHOL/HDL RATIO: 3.7 ratio
CHOLESTEROL: 193 mg/dL (ref 0–200)
HDL: 52 mg/dL (ref >40)
LDL Cholesterol: 125 mg/dL — ABNORMAL HIGH (ref 0–99)
Triglycerides: 80 mg/dL (ref <150)
VLDL: 16 mg/dL (ref 0–40)

## 2016-04-22 LAB — GLUCOSE, CAPILLARY
Glucose-Capillary: 174 mg/dL — ABNORMAL HIGH (ref 65–99)
Glucose-Capillary: 97 mg/dL (ref 65–99)
Glucose-Capillary: 116 mg/dL — ABNORMAL HIGH (ref 65–99)
Glucose-Capillary: 168 mg/dL — ABNORMAL HIGH (ref 65–99)

## 2016-04-22 MED ORDER — LISINOPRIL 10 MG PO TABS
10.0000 mg | ORAL_TABLET | Freq: Every day | ORAL | Status: DC
  Administered 2016-04-23: 10 mg via ORAL
  Filled 2016-04-22 (×2): qty 1

## 2016-04-22 MED ORDER — POTASSIUM CHLORIDE CRYS ER 20 MEQ PO TBCR
40.0000 meq | EXTENDED_RELEASE_TABLET | Freq: Once | ORAL | Status: AC
  Administered 2016-04-22: 40 meq via ORAL
  Filled 2016-04-22: qty 2

## 2016-04-22 MED ORDER — ATORVASTATIN CALCIUM 80 MG PO TABS
80.0000 mg | ORAL_TABLET | Freq: Every day | ORAL | Status: DC
  Administered 2016-04-22 – 2016-04-24 (×3): 80 mg via ORAL
  Filled 2016-04-22 (×3): qty 1

## 2016-04-22 MED ORDER — CARVEDILOL 3.125 MG PO TABS
3.1250 mg | ORAL_TABLET | Freq: Two times a day (BID) | ORAL | Status: DC
  Administered 2016-04-22: 3.125 mg via ORAL
  Filled 2016-04-22: qty 1

## 2016-04-22 MED ORDER — PNEUMOCOCCAL VAC POLYVALENT 25 MCG/0.5ML IJ INJ: 0.5000 mL | INJECTION | INTRAMUSCULAR | Status: DC | PRN

## 2016-04-22 NOTE — Progress Notes (Signed)
[facility] PROGRESS NOTE  Christopher Roth ENI:[id number] DOB: 28-Mar-1971 DOA: 04/20/2016  PCP: Helane Rima, MD  Brief History/Interval Summary: 45 y.o. male with history of diabetes mellitus type 2, hypertension presently off medications, tobacco abuse quit 2 weeks ago presented to the ER because of shortness of breath. Patient has been having productive cough for last couple of months and has been on at least on 3 courses of antibiotics. He finished a course recently. Patient was found to have multifocal pneumonia on CT scan. Bilateral pleural effusions were also noted. Patient was hospitalized for further management.  Reason for Visit: Multifocal pneumonia. Possible fluid overload  Consultants: None yet  Procedures:  Transthoracic echocardiogram Study Conclusions  - Left ventricle: The cavity size was normal. There was mild   concentric hypertrophy. Systolic function was severely reduced.   The estimated ejection fraction was in the range of 25% to 30%.   There is akinesis of the entireapical myocardium. There is   akinesis of the entireanteroseptal, inferior, and inferoseptal   myocardium. There is severe hypokinesis of the anterior   myocardium. Features are consistent with a pseudonormal left   ventricular filling pattern, with concomitant abnormal relaxation   and increased filling pressure (grade 2 diastolic dysfunction).   Doppler parameters are consistent with high ventricular filling   pressure. - Left atrium: The atrium was mildly dilated. - Pulmonary arteries: Systolic pressure could not be accurately   estimated. - Pericardium, extracardiac: A trivial, free-flowing pericardial   effusion was identified along the right atrial free wall. The   fluid had no internal echoes.  Antibiotics: Ceftriaxone and azithromycin  Subjective/Interval History: Patient feels better. Denies any shortness of breath or chest pain.   ROS: Denies any nausea or  vomiting  Objective:  Vital Signs  Vitals:   04/21/16 0320 04/21/16 0552 04/21/16 2244 04/22/16 0617  BP: (!) 171/102 (!) 162/97 135/89 123/82  Pulse:  91 80 71  Resp:  20 17 17   Temp:  98.1 F (36.7 C) 97.9 F (36.6 C) 97.9 F (36.6 C)  TempSrc:  Oral Oral Oral  SpO2:  92% 98% 98%  Weight:    95 kg (209 lb 7 oz)  Height:        Intake/Output Summary (Last 24 hours) at 04/22/16 1211 Last data filed at 04/22/16 6144  Gross per 24 hour  Intake              420 ml  Output                0 ml  Net              420 ml   Filed Weights   04/20/16 1901 04/22/16 0617  Weight: 94.8 kg (209 lb) 95 kg (209 lb 7 oz)    General appearance: alert, cooperative, appears stated age and no distress Resp: Improved air entry bilaterally. No crackles appreciated today. Some dullness to percussion at the bases. Cardio: regular rate and rhythm, S1, S2 normal, no murmur, click, rub or gallop GI: soft, non-tender; bowel sounds normal; no masses,  no organomegaly Extremities: extremities normal, atraumatic, no cyanosis or edema Neurologic: Alert and oriented X 3, no focal deficits  Lab Results:  Data Reviewed: I have personally reviewed following labs and imaging studies  CBC:  Recent Labs Lab 04/20/16 1906 04/21/16 0457 04/22/16 0454  WBC 9.4 9.3 6.6  NEUTROABS  --  6.4  --   HGB 12.6* 12.9* 11.8*  HCT 36.2* 37.8*  34.3*  MCV 84.0 83.4 82.7  PLT 442* 401* 389    Basic Metabolic Panel:  Recent Labs Lab 04/20/16 1906 04/21/16 0457 04/22/16 0454  NA 138 138 140  K 3.8 3.6 3.3*  CL 106 105 105  CO2 24 26 25   GLUCOSE 148* 164* 126*  BUN 6 7 8   CREATININE 1.00 0.92 0.97  CALCIUM 9.3 9.4 9.0    GFR: Estimated Creatinine Clearance: 107.5 mL/min (by C-G formula based on SCr of 0.97 mg/dL).  Liver Function Tests:  Recent Labs Lab 04/21/16 0457  AST 17  ALT 23  ALKPHOS 70  BILITOT 1.1  PROT 6.7  ALBUMIN 3.8    Cardiac Enzymes:  Recent Labs Lab 04/21/16 0457  04/21/16 1017 04/21/16 1556  TROPONINI 0.15* 0.14* 0.14*    CBG:  Recent Labs Lab 04/21/16 1239 04/21/16 1745 04/21/16 1847 04/21/16 2245 04/22/16 0812  GLUCAP 108* 76 188* 136* 174*    Thyroid Function Tests:  Recent Labs  04/21/16 0457  TSH 1.387     Radiology Studies: Dg Chest 2 View  Result Date: 04/20/2016 CLINICAL DATA:  Pt has had SOB and left sided chest pain for the last week. Pt also c/o dry cough. Pt has a hx of HTN, diabetes and is a recent former smoker. EXAM: CHEST  2 VIEW COMPARISON:  None. FINDINGS: Heart size is mildly enlarged. There are patchy infiltrates involving the lower lobes, lingula, and right middle lobe. Small bilateral pleural effusions are suspected. No evidence for pulmonary edema. IMPRESSION: Multifocal infiltrates. Electronically Signed   By: Nolon Nations M.D.   On: 04/20/2016 20:13   Ct Chest W Contrast  Result Date: 04/21/2016 CLINICAL DATA:  Shortness of breath and cough for 1 week. Mild left-sided chest pain. EXAM: CT CHEST WITH CONTRAST TECHNIQUE: Multidetector CT imaging of the chest was performed during intravenous contrast administration. CONTRAST:  64m ISOVUE-300 IOPAMIDOL (ISOVUE-300) INJECTION 61% COMPARISON:  Chest radiograph 04/20/2016 FINDINGS: Cardiovascular: There is coronary artery atherosclerosis. Heart size is normal without pericardial effusion. The main pulmonary artery measures 3.6 cm at the bifurcation. The thoracic aorta is normal. Proximal arch vessels are patent. Mediastinum/Nodes: Pretracheal lymph nodes measure up to 1.5 cm. The mediastinum is otherwise unremarkable. Lungs/Pleura: There are medium-sized bilateral pleural effusions. There are multifocal patchy opacities within both lungs, slightly worse on the left. There is multifocal subsegmental atelectasis nodule in the right upper lobe measures 5 mm. Left upper lobe nodule measures 7 mm. No masses are visualized. No focal hilar abnormality. Central airways are  patent. Upper Abdomen: No acute abnormality. Musculoskeletal: No chest wall abnormality. No acute or significant osseous findings. IMPRESSION: 1. Medium-sized bilateral pleural effusions and multifocal patchy airspace opacities, suggesting multifocal pneumonia. 2. No discrete pulmonary masses. Small bilateral upper lobe pulmonary nodules, measuring up to 7 mm. These are favored to be part of the same inflammatory/infectious process. However, non-contrast chest CT at 3-6 months is recommended. If the nodules are stable at time of repeat CT, then future CT at 18-24 months (from today's scan) is considered optional for low-risk patients, but is recommended for high-risk patients. This recommendation follows the consensus statement: Guidelines for Management of Incidental Pulmonary Nodules Detected on CT Images: From the Fleischner Society 2017; Radiology 2017; 284:228-243. 3. Enlarged main pulmonary artery may indicate underlying pulmonary hypertension. 4. Coronary artery atherosclerosis. Electronically Signed   By: KUlyses JarredM.D.   On: 04/21/2016 00:11     Medications:  Scheduled: . aspirin  325 mg Oral Daily  .  atorvastatin  80 mg Oral q1800  . azithromycin  500 mg Intravenous QHS  . cefTRIAXone (ROCEPHIN)  IV  1 g Intravenous QHS  . enoxaparin (LOVENOX) injection  40 mg Subcutaneous Q24H  . glipiZIDE  10 mg Oral Q breakfast  . hydrALAZINE  10 mg Intravenous Once  . hydrALAZINE  25 mg Oral Q8H  . insulin aspart  0-9 Units Subcutaneous TID WC  . lisinopril  5 mg Oral Daily   Continuous:  CWU:[id number] **OR** acetaminophen, hydrALAZINE, ondansetron **OR** ondansetron (ZOFRAN) IV, pneumococcal 23 valent vaccine  Assessment/Plan:  Principal Problem:   Acute systolic congestive heart failure (HCC) Active Problems:   Essential hypertension   Diabetes mellitus type II, uncontrolled ([place])   Community acquired pneumonia   Elevated troponin   Pneumonia    Acute respiratory failure with  hypoxia. Initially etiology was thought to be due to multifocal pneumonia noted on CT scan. Patient, however, also noted to have bilateral pleural effusion. He had elevated BNP. There could be an element of fluid overload as well. Mild cardiomegaly noted on x-ray. His EKG is abnormal. Echocardiogram does show evidence for systolic dysfunction. Patient's symptoms improved with Lasix. Repeat chest x-ray today. Oxygen as needed.  Acute systolic CHF. EF is 25-30%. This is a new diagnosis for him. Detected on echocardiogram. He has received Lasix as of yesterday. He has diuresed well. Defer further doses of diuretics to cardiology. Patient will likely need further cardiac workup. Await cardiology input. TSH is normal. Trivial pericardial effusion also noted on echocardiogram.  Multifocal pneumonia Will repeat chest x-ray today. If there is improvement, then we will discontinue antibiotics as it appears that his symptoms were mainly due to acute systolic CHF. On ceftriaxone and azithromycin. Influenza PCR is negative.   Accelerated hypertension Blood pressure was poorly controlled. Patient has been started back on lisinopril. Hydralazine as needed. Blood pressure has improved. Continue to monitor.  Chest tightness with minimal troponin elevation and abnormal EKG Denies any discomfort. Abnormal echocardiogram as noted above. Troponins noted to be minimally elevated. D-dimer was high, but CT scan which was done with contrast did not show any PE. Continue aspirin. Wait for cardiology input in view of his newly detected CHF. LDL 125. Will initiate statin.  Diabetes mellitus type 2. Last HbA1c was 9. Recently started on glipizide. Also on metformin, which is being held. Sliding scale insulin coverage.  History of tobacco abuse. Apparently, patient quit 2 weeks ago. Continue to emphasize cessation.  Bilateral pulmonary nodules. Detected on CT scan. Will need close outpatient monitoring.  DVT Prophylaxis:  Lovenox    Code Status: Full code  Family Communication: Discussed with the patient  Disposition Plan: Management as outlined above. Await cardiology input.   LOS: 1 day   [facility] Pager [phone] 04/22/2016, 12:11 PM  If 7PM-7AM, please contact night-coverage at [email or web address], password [facility]

## 2016-04-22 NOTE — Progress Notes (Signed)
Patient Name: Christopher Roth Date of Encounter: 04/22/2016  Primary Cardiologist: Dr [facility] Problem List     Principal Problem:   Acute systolic congestive heart failure [facility]) Active Problems:   Essential hypertension   Diabetes mellitus type II, uncontrolled ([place])   Community acquired pneumonia   Elevated troponin   Pneumonia     Subjective   No chest pain or dyspnea  Inpatient Medications    Scheduled Meds: . aspirin  325 mg Oral Daily  . atorvastatin  80 mg Oral q1800  . azithromycin  500 mg Intravenous QHS  . cefTRIAXone (ROCEPHIN)  IV  1 g Intravenous QHS  . enoxaparin (LOVENOX) injection  40 mg Subcutaneous Q24H  . glipiZIDE  10 mg Oral Q breakfast  . hydrALAZINE  10 mg Intravenous Once  . hydrALAZINE  25 mg Oral Q8H  . insulin aspart  0-9 Units Subcutaneous TID WC  . lisinopril  5 mg Oral Daily   Continuous Infusions:  PRN Meds: acetaminophen **OR** acetaminophen, hydrALAZINE, ondansetron **OR** ondansetron (ZOFRAN) IV, pneumococcal 23 valent vaccine   Vital Signs    Vitals:   04/21/16 0320 04/21/16 0552 04/21/16 2244 04/22/16 0617  BP: (!) 171/102 (!) 162/97 135/89 123/82  Pulse:  91 80 71  Resp:  20 17 17   Temp:  98.1 F (36.7 C) 97.9 F (36.6 C) 97.9 F (36.6 C)  TempSrc:  Oral Oral Oral  SpO2:  92% 98% 98%  Weight:    209 lb 7 oz (95 kg)  Height:        Intake/Output Summary (Last 24 hours) at 04/22/16 1332 Last data filed at 04/22/16 0952  Gross per 24 hour  Intake              360 ml  Output                0 ml  Net              360 ml   Filed Weights   04/20/16 1901 04/22/16 0617  Weight: 209 lb (94.8 kg) 209 lb 7 oz (95 kg)    Physical Exam    GEN: Well nourished, well developed, in no acute distress.  HEENT: Grossly normal.  Neck: Supple Cardiac: RRR; no edema Respiratory:  CTA GI: Soft, nontender, nondistended. MS: no deformity or atrophy. Skin: warm and dry, no rash. Neuro:  Strength and sensation are  intact.   Labs    CBC  Recent Labs  04/21/16 0457 04/22/16 0454  WBC 9.3 6.6  NEUTROABS 6.4  --   HGB 12.9* 11.8*  HCT 37.8* 34.3*  MCV 83.4 82.7  PLT 401* 034   Basic Metabolic Panel  Recent Labs  04/21/16 0457 04/22/16 0454  NA 138 140  K 3.6 3.3*  CL 105 105  CO2 26 25  GLUCOSE 164* 126*  BUN 7 8  CREATININE 0.92 0.97  CALCIUM 9.4 9.0   Liver Function Tests  Recent Labs  04/21/16 0457  AST 17  ALT 23  ALKPHOS 70  BILITOT 1.1  PROT 6.7  ALBUMIN 3.8   Cardiac Enzymes  Recent Labs  04/21/16 0457 04/21/16 1017 04/21/16 1556  TROPONINI 0.15* 0.14* 0.14*   D-Dimer  Recent Labs  04/21/16 0616  DDIMER 1.32*   Fasting Lipid Panel  Recent Labs  04/22/16 0454  CHOL 193  HDL 52  LDLCALC 125*  TRIG 80  CHOLHDL 3.7   Thyroid Function Tests  Recent Labs  04/21/16  0457  TSH 1.387    Telemetry    Sinus - Personally Reviewed   Radiology    Dg Chest 2 View  Result Date: 04/22/2016 CLINICAL DATA:  Follow-up recent episode of pneumonia. The patient has completed antibiotics. Patient reports only mild lingering cough. EXAM: CHEST  2 VIEW COMPARISON:  CT scan of the chest of April 20, 2016 and chest x-ray of the same day. FINDINGS: The lungs are adequately inflated. There is patchy increased density in the left lower lobe. There is a small left pleural effusion and smaller right pleural effusion. There has been improvement in the appearance of the pulmonary interstitium at the right lung base. The heart is normal in size. The pulmonary vascularity is not engorged. The mediastinum is normal in width. The bony thorax is unremarkable. IMPRESSION: Persistent left lower lobe infiltrate. Persistent small bilateral pleural effusions greatest on the left. Given that the patient has clinically improved, a follow-up PA and lateral chest X-ray is recommended in 3-4 weeks to ensure complete resolution and exclude underlying malignancy. Electronically  Signed   By: David  Martinique M.D.   On: 04/22/2016 13:15   Dg Chest 2 View  Result Date: 04/20/2016 CLINICAL DATA:  Pt has had SOB and left sided chest pain for the last week. Pt also c/o dry cough. Pt has a hx of HTN, diabetes and is a recent former smoker. EXAM: CHEST  2 VIEW COMPARISON:  None. FINDINGS: Heart size is mildly enlarged. There are patchy infiltrates involving the lower lobes, lingula, and right middle lobe. Small bilateral pleural effusions are suspected. No evidence for pulmonary edema. IMPRESSION: Multifocal infiltrates. Electronically Signed   By: Nolon Nations M.D.   On: 04/20/2016 20:13   Ct Chest W Contrast  Result Date: 04/21/2016 CLINICAL DATA:  Shortness of breath and cough for 1 week. Mild left-sided chest pain. EXAM: CT CHEST WITH CONTRAST TECHNIQUE: Multidetector CT imaging of the chest was performed during intravenous contrast administration. CONTRAST:  69m ISOVUE-300 IOPAMIDOL (ISOVUE-300) INJECTION 61% COMPARISON:  Chest radiograph 04/20/2016 FINDINGS: Cardiovascular: There is coronary artery atherosclerosis. Heart size is normal without pericardial effusion. The main pulmonary artery measures 3.6 cm at the bifurcation. The thoracic aorta is normal. Proximal arch vessels are patent. Mediastinum/Nodes: Pretracheal lymph nodes measure up to 1.5 cm. The mediastinum is otherwise unremarkable. Lungs/Pleura: There are medium-sized bilateral pleural effusions. There are multifocal patchy opacities within both lungs, slightly worse on the left. There is multifocal subsegmental atelectasis nodule in the right upper lobe measures 5 mm. Left upper lobe nodule measures 7 mm. No masses are visualized. No focal hilar abnormality. Central airways are patent. Upper Abdomen: No acute abnormality. Musculoskeletal: No chest wall abnormality. No acute or significant osseous findings. IMPRESSION: 1. Medium-sized bilateral pleural effusions and multifocal patchy airspace opacities, suggesting  multifocal pneumonia. 2. No discrete pulmonary masses. Small bilateral upper lobe pulmonary nodules, measuring up to 7 mm. These are favored to be part of the same inflammatory/infectious process. However, non-contrast chest CT at 3-6 months is recommended. If the nodules are stable at time of repeat CT, then future CT at 18-24 months (from today's scan) is considered optional for low-risk patients, but is recommended for high-risk patients. This recommendation follows the consensus statement: Guidelines for Management of Incidental Pulmonary Nodules Detected on CT Images: From the Fleischner Society 2017; Radiology 2017; 284:228-243. 3. Enlarged main pulmonary artery may indicate underlying pulmonary hypertension. 4. Coronary artery atherosclerosis. Electronically Signed   By: KUlyses JarredM.D.   On:  04/21/2016 00:11      Patient Profile  45 year old male with past medical history of hypertension, diabetes mellitus, tobacco abuse admitted with congestive heart failure symptoms. Chest CT showed pleural effusions, coronary atherosclerosis and large pulmonary arteries. BNP 915. Echocardiogram shows severe LV dysfunction with ejection fraction 25-30%.   Assessment & Plan    1 acute congestive heart failure-patient's presentation is most consistent with CHF. Echo shows EF 25-30 and grade 2 diastolic dysfunction. He was severely hypertensive at time of admission. 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 He is also diabetic, there is coronary calcification on CT and possible septal infarct on electrocardiogram. Ischemia may be possible. Plan cardiac catheterization to exclude CAD (The risks and benefits including myocardial infarction, CVA and death discussed and he agrees to proceed). I will increase lisinopril to 10 mg daily. Add carvedilol 3.125 mg twice a day. We will likely add low-dose diuretic following procedure. Patient will need follow-up echocardiogram 3 months after medications titrated to see if  LV function improved. Also note he consumes approximately 6-12 beers per weekend.  2 severe HTN-medication adjustment as outlined above. We will continue to titrate ACE inhibitor and beta blocker as tolerated. We discussed the importance of blood pressure control.   3 question pneumonia-antibiotics per primary care.  4 diabetes mellitus   5 elevated troponin-there is no clear trend and he is not having chest pain; not consistent with acute coronary syndrome. Cardiac cath as outlined above due to reduced LV function.   6 abnormal electrocardiogram-plan as outlined under #1.  Signed, Kirk Ruths, MD  04/22/2016, 1:32 PM

## 2016-04-23 ENCOUNTER — Encounter (HOSPITAL_COMMUNITY)
Admission: EM | Disposition: A | Discharge status: Home / Self Care | Payer: Self-pay | Source: Home / Self Care | Attending: Internal Medicine

## 2016-04-23 DIAGNOSIS — E1165 Type 2 diabetes mellitus with hyperglycemia: Secondary | ICD-10-CM

## 2016-04-23 DIAGNOSIS — I5020 Unspecified systolic (congestive) heart failure: Secondary | ICD-10-CM

## 2016-04-23 DIAGNOSIS — I25118 Atherosclerotic heart disease of native coronary artery with other forms of angina pectoris: Secondary | ICD-10-CM

## 2016-04-23 HISTORY — PX: CARDIAC CATHETERIZATION: SHX172

## 2016-04-23 LAB — PROTIME-INR
INR: 1.07
Prothrombin Time: 13.9 seconds (ref 11.4–15.2)

## 2016-04-23 LAB — BASIC METABOLIC PANEL
ANION GAP: 8 (ref 5–15)
BUN: 11 mg/dL (ref 6–20)
CALCIUM: 9.2 mg/dL (ref 8.9–10.3)
CO2: 24 mmol/L (ref 22–32)
Chloride: 108 mmol/L (ref 101–111)
Creatinine, Ser: 0.94 mg/dL (ref 0.61–1.24)
Glucose, Bld: 136 mg/dL — ABNORMAL HIGH (ref 65–99)
POTASSIUM: 3.7 mmol/L (ref 3.5–5.1)
Sodium: 140 mmol/L (ref 135–145)

## 2016-04-23 LAB — POCT I-STAT 3, ART BLOOD GAS (G3+)
BICARBONATE: 25 mmol/L (ref 20.0–28.0)
Bicarbonate: 24.7 mmol/L (ref 20.0–28.0)
O2 SAT: 97 %
O2 Saturation: 97 %
PCO2 ART: 39.8 mmHg (ref 32.0–48.0)
PO2 ART: 91 mmHg (ref 83.0–108.0)
TCO2: 26 mmol/L (ref 0–100)
TCO2: 26 mmol/L (ref 0–100)
pCO2 arterial: 41.7 mmHg (ref 32.0–48.0)
pH, Arterial: 7.386 (ref 7.350–7.450)
pH, Arterial: 7.4 (ref 7.350–7.450)
pO2, Arterial: 92 mmHg (ref 83.0–108.0)

## 2016-04-23 LAB — POCT I-STAT 3, VENOUS BLOOD GAS (G3P V)
Acid-Base Excess: 3 mmol/L — ABNORMAL HIGH (ref 0.0–2.0)
BICARBONATE: 27.2 mmol/L (ref 20.0–28.0)
O2 SAT: 65 %
TCO2: 28 mmol/L (ref 0–100)
pCO2, Ven: 40.3 mmHg — ABNORMAL LOW (ref 44.0–60.0)
pH, Ven: 7.437 — ABNORMAL HIGH (ref 7.250–7.430)
pO2, Ven: 33 mmHg (ref 32.0–45.0)

## 2016-04-23 LAB — GLUCOSE, CAPILLARY
GLUCOSE-CAPILLARY: 153 mg/dL — AB (ref 65–99)
GLUCOSE-CAPILLARY: 192 mg/dL — AB (ref 65–99)
Glucose-Capillary: 166 mg/dL — ABNORMAL HIGH (ref 65–99)

## 2016-04-23 LAB — CBC
HEMATOCRIT: 33.6 % — AB (ref 39.0–52.0)
Hemoglobin: 11.3 g/dL — ABNORMAL LOW (ref 13.0–17.0)
MCH: 28.5 pg (ref 26.0–34.0)
MCHC: 33.6 g/dL (ref 30.0–36.0)
MCV: 84.6 fL (ref 78.0–100.0)
Platelets: 300 10*3/uL (ref 150–400)
RBC: 3.97 MIL/uL — AB (ref 4.22–5.81)
RDW: 14.2 % (ref 11.5–15.5)
WBC: 5.9 10*3/uL (ref 4.0–10.5)

## 2016-04-23 SURGERY — RIGHT/LEFT HEART CATH AND CORONARY ANGIOGRAPHY
Anesthesia: LOCAL

## 2016-04-23 MED ORDER — VERAPAMIL HCL 2.5 MG/ML IV SOLN
INTRAVENOUS | Status: DC | PRN
  Administered 2016-04-23: 16:00:00 via INTRA_ARTERIAL

## 2016-04-23 MED ORDER — VERAPAMIL HCL 2.5 MG/ML IV SOLN
INTRAVENOUS | Status: AC
  Filled 2016-04-23: qty 2

## 2016-04-23 MED ORDER — MIDAZOLAM HCL 2 MG/2ML IJ SOLN
INTRAMUSCULAR | Status: AC
Start: 2016-04-23 — End: 2016-04-23
  Filled 2016-04-23: qty 2

## 2016-04-23 MED ORDER — NITROGLYCERIN 1 MG/10 ML FOR IR/CATH LAB
INTRA_ARTERIAL | Status: AC
  Filled 2016-04-23: qty 10

## 2016-04-23 MED ORDER — NITROGLYCERIN 1 MG/10 ML FOR IR/CATH LAB
INTRA_ARTERIAL | Status: DC | PRN
Start: 2016-04-23 — End: 2016-04-23
  Administered 2016-04-23: 200 ug via INTRACORONARY

## 2016-04-23 MED ORDER — SODIUM CHLORIDE 0.9 % IV SOLN: 250.0000 mL | INTRAVENOUS | Status: DC | PRN

## 2016-04-23 MED ORDER — GUAIFENESIN ER 600 MG PO TB12
1200.0000 mg | ORAL_TABLET | Freq: Two times a day (BID) | ORAL | Status: DC
Start: 2016-04-23 — End: 2016-04-25
  Administered 2016-04-23 – 2016-04-25 (×5): 1200 mg via ORAL
  Filled 2016-04-23 (×6): qty 2

## 2016-04-23 MED ORDER — ENOXAPARIN SODIUM 40 MG/0.4ML ~~LOC~~ SOLN
40.0000 mg | SUBCUTANEOUS | Status: DC
  Administered 2016-04-24 – 2016-04-25 (×2): 40 mg via SUBCUTANEOUS
  Filled 2016-04-23 (×3): qty 0.4

## 2016-04-23 MED ORDER — HEPARIN (PORCINE) IN NACL 2-0.9 UNIT/ML-% IJ SOLN
INTRAMUSCULAR | Status: DC | PRN
  Administered 2016-04-23: 1000 mL via INTRA_ARTERIAL

## 2016-04-23 MED ORDER — SODIUM CHLORIDE 0.9% FLUSH
3.0000 mL | Freq: Two times a day (BID) | INTRAVENOUS | Status: DC
  Administered 2016-04-24: 3 mL via INTRAVENOUS

## 2016-04-23 MED ORDER — LIDOCAINE HCL (PF) 1 % IJ SOLN
INTRAMUSCULAR | Status: AC
  Filled 2016-04-23: qty 30

## 2016-04-23 MED ORDER — LIDOCAINE HCL (PF) 1 % IJ SOLN
INTRAMUSCULAR | Status: DC | PRN
  Administered 2016-04-23 (×2): 2 mL via INTRADERMAL

## 2016-04-23 MED ORDER — POTASSIUM CHLORIDE CRYS ER 20 MEQ PO TBCR
40.0000 meq | EXTENDED_RELEASE_TABLET | Freq: Once | ORAL | Status: AC
  Administered 2016-04-23: 40 meq via ORAL
  Filled 2016-04-23: qty 2

## 2016-04-23 MED ORDER — CARVEDILOL 6.25 MG PO TABS
6.2500 mg | ORAL_TABLET | Freq: Two times a day (BID) | ORAL | Status: DC
Start: 2016-04-23 — End: 2016-04-25
  Administered 2016-04-23 – 2016-04-25 (×4): 6.25 mg via ORAL
  Filled 2016-04-23 (×6): qty 1

## 2016-04-23 MED ORDER — FUROSEMIDE 10 MG/ML IJ SOLN
40.0000 mg | Freq: Once | INTRAMUSCULAR | Status: AC
  Administered 2016-04-23: 40 mg via INTRAVENOUS
  Filled 2016-04-23: qty 4

## 2016-04-23 MED ORDER — IOPAMIDOL (ISOVUE-370) INJECTION 76%
INTRAVENOUS | Status: DC | PRN
  Administered 2016-04-23: 100 mL via INTRA_ARTERIAL

## 2016-04-23 MED ORDER — ACETAMINOPHEN 325 MG PO TABS: 650.0000 mg | ORAL_TABLET | ORAL | Status: DC | PRN

## 2016-04-23 MED ORDER — ONDANSETRON HCL 4 MG/2ML IJ SOLN: 4.0000 mg | Freq: Four times a day (QID) | INTRAMUSCULAR | Status: DC | PRN

## 2016-04-23 MED ORDER — FENTANYL CITRATE (PF) 100 MCG/2ML IJ SOLN
INTRAMUSCULAR | Status: DC | PRN
  Administered 2016-04-23: 50 ug via INTRAVENOUS

## 2016-04-23 MED ORDER — FENTANYL CITRATE (PF) 100 MCG/2ML IJ SOLN
INTRAMUSCULAR | Status: AC
  Filled 2016-04-23: qty 2

## 2016-04-23 MED ORDER — SODIUM CHLORIDE 0.9% FLUSH
3.0000 mL | Freq: Two times a day (BID) | INTRAVENOUS | Status: DC
  Administered 2016-04-23 – 2016-04-25 (×4): 3 mL via INTRAVENOUS

## 2016-04-23 MED ORDER — SODIUM CHLORIDE 0.9 % IV SOLN: INTRAVENOUS | Status: DC

## 2016-04-23 MED ORDER — HEPARIN (PORCINE) IN NACL 2-0.9 UNIT/ML-% IJ SOLN
INTRAMUSCULAR | Status: AC
  Filled 2016-04-23: qty 1000

## 2016-04-23 MED ORDER — SODIUM CHLORIDE 0.9% FLUSH: 3.0000 mL | INTRAVENOUS | Status: DC | PRN

## 2016-04-23 MED ORDER — MIDAZOLAM HCL 2 MG/2ML IJ SOLN
INTRAMUSCULAR | Status: DC | PRN
Start: 2016-04-23 — End: 2016-04-23
  Administered 2016-04-23: 1 mg via INTRAVENOUS

## 2016-04-23 MED ORDER — SODIUM CHLORIDE 0.9% FLUSH
3.0000 mL | Freq: Two times a day (BID) | INTRAVENOUS | Status: DC
  Administered 2016-04-23: 3 mL via INTRAVENOUS

## 2016-04-23 MED ORDER — ASPIRIN 81 MG PO CHEW
81.0000 mg | CHEWABLE_TABLET | ORAL | Status: AC
  Administered 2016-04-23: 81 mg via ORAL
  Filled 2016-04-23: qty 1

## 2016-04-23 MED ORDER — HEPARIN SODIUM (PORCINE) 1000 UNIT/ML IJ SOLN
INTRAMUSCULAR | Status: DC | PRN
  Administered 2016-04-23: 4500 [IU] via INTRAVENOUS

## 2016-04-23 MED ORDER — HEPARIN SODIUM (PORCINE) 1000 UNIT/ML IJ SOLN
INTRAMUSCULAR | Status: AC
  Filled 2016-04-23: qty 1

## 2016-04-23 MED ORDER — IOPAMIDOL (ISOVUE-370) INJECTION 76%
INTRAVENOUS | Status: AC
  Filled 2016-04-23: qty 100

## 2016-04-23 SURGICAL SUPPLY — 11 items
CATH 5FR JL3.5 JR4 ANG PIG MP (CATHETERS) ×1 IMPLANT
CATH BALLN WEDGE 5F 110CM (CATHETERS) ×1 IMPLANT
DEVICE RAD COMP TR BAND LRG (VASCULAR PRODUCTS) ×1 IMPLANT
GLIDESHEATH SLEND A-KIT 6F 22G (SHEATH) ×1 IMPLANT
GUIDEWIRE INQWIRE 1.5J.035X260 (WIRE) IMPLANT
INQWIRE 1.5J .035X260CM (WIRE) ×2
KIT HEART LEFT (KITS) ×2 IMPLANT
PACK CARDIAC CATHETERIZATION (CUSTOM PROCEDURE TRAY) ×2 IMPLANT
SHEATH FAST CATH BRACH 5F 5CM (SHEATH) ×1 IMPLANT
TRANSDUCER W/STOPCOCK (MISCELLANEOUS) ×3 IMPLANT
TUBING CIL FLEX 10 FLL-RA (TUBING) ×2 IMPLANT

## 2016-04-23 NOTE — CV Procedure (Addendum)
   Right and left heart cath completed.  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 Faint right-to-left collaterals and left left collaterals. The patient is having no chest discomfort. All chest discomfort resolved approximately a week to 10 days ago.  60% mid circumflex, 60% ostial circumflex, 60% mid ramus.  Severe left ventricular systolic dysfunction, EF 09-40%, out of proportion to the degree of coronary disease identified. Rule out a component of myocarditis related to recent upper respiratory illness.  Consider advanced heart failure team consultation.  Consider LifeVest given recent anterior infarct.

## 2016-04-23 NOTE — H&P (View-Only) (Signed)
Patient Name: Christopher Roth Date of Encounter: 04/23/2016  Primary Cardiologist: Dr [facility] Problem List     Principal Problem:   Acute systolic congestive heart failure [facility]) Active Problems:   Essential hypertension   Diabetes mellitus type II, uncontrolled ([place])   Community acquired pneumonia   Elevated troponin   Pneumonia     Subjective   No chest pain or dyspnea  Inpatient Medications    Scheduled Meds: . aspirin  325 mg Oral Daily  . atorvastatin  80 mg Oral q1800  . azithromycin  500 mg Intravenous QHS  . carvedilol  3.125 mg Oral BID WC  . cefTRIAXone (ROCEPHIN)  IV  1 g Intravenous QHS  . enoxaparin (LOVENOX) injection  40 mg Subcutaneous Q24H  . glipiZIDE  10 mg Oral Q breakfast  . hydrALAZINE  10 mg Intravenous Once  . insulin aspart  0-9 Units Subcutaneous TID WC  . lisinopril  10 mg Oral Daily  . sodium chloride flush  3 mL Intravenous Q12H   Continuous Infusions: . [START ON 04/24/2016] sodium chloride     PRN Meds: sodium chloride, acetaminophen **OR** acetaminophen, hydrALAZINE, ondansetron **OR** ondansetron (ZOFRAN) IV, pneumococcal 23 valent vaccine, sodium chloride flush   Vital Signs    Vitals:   04/22/16 1601 04/22/16 2136 04/23/16 0421 04/23/16 0538  BP: 135/88 134/81  127/87  Pulse: 76 77  83  Resp: 20 18  18   Temp:  98.7 F (37.1 C)  98.6 F (37 C)  TempSrc:  Oral  Oral  SpO2: 99% 98%  100%  Weight:   211 lb 10.3 oz (96 kg)   Height:        Intake/Output Summary (Last 24 hours) at 04/23/16 0814 Last data filed at 04/22/16 1729  Gross per 24 hour  Intake              360 ml  Output                0 ml  Net              360 ml   Filed Weights   04/20/16 1901 04/22/16 0617 04/23/16 0421  Weight: 209 lb (94.8 kg) 209 lb 7 oz (95 kg) 211 lb 10.3 oz (96 kg)    Physical Exam    GEN: Well nourished, well developed, in no acute distress.  HEENT: Grossly normal.  Neck: Supple Cardiac: RRR; no edema Respiratory:   CTA GI: Soft, nontender, nondistended. MS: no deformity or atrophy. Skin: warm and dry, no rash. Neuro:  Strength and sensation are intact.   Labs    CBC  Recent Labs  04/21/16 0457 04/22/16 0454 04/23/16 0457  WBC 9.3 6.6 5.9  NEUTROABS 6.4  --   --   HGB 12.9* 11.8* 11.3*  HCT 37.8* 34.3* 33.6*  MCV 83.4 82.7 84.6  PLT 401* 351 267   Basic Metabolic Panel  Recent Labs  04/22/16 0454 04/23/16 0457  NA 140 140  K 3.3* 3.7  CL 105 108  CO2 25 24  GLUCOSE 126* 136*  BUN 8 11  CREATININE 0.97 0.94  CALCIUM 9.0 9.2   Liver Function Tests  Recent Labs  04/21/16 0457  AST 17  ALT 23  ALKPHOS 70  BILITOT 1.1  PROT 6.7  ALBUMIN 3.8   Cardiac Enzymes  Recent Labs  04/21/16 0457 04/21/16 1017 04/21/16 1556  TROPONINI 0.15* 0.14* 0.14*   D-Dimer  Recent Labs  04/21/16 1245  DDIMER 1.32*   Fasting Lipid Panel  Recent Labs  04/22/16 0454  CHOL 193  HDL 52  LDLCALC 125*  TRIG 80  CHOLHDL 3.7   Thyroid Function Tests  Recent Labs  04/21/16 0457  TSH 1.387    Telemetry    Sinus with PVCs - Personally Reviewed   Radiology    Dg Chest 2 View  Result Date: 04/22/2016 CLINICAL DATA:  Follow-up recent episode of pneumonia. The patient has completed antibiotics. Patient reports only mild lingering cough. EXAM: CHEST  2 VIEW COMPARISON:  CT scan of the chest of April 20, 2016 and chest x-ray of the same day. FINDINGS: The lungs are adequately inflated. There is patchy increased density in the left lower lobe. There is a small left pleural effusion and smaller right pleural effusion. There has been improvement in the appearance of the pulmonary interstitium at the right lung base. The heart is normal in size. The pulmonary vascularity is not engorged. The mediastinum is normal in width. The bony thorax is unremarkable. IMPRESSION: Persistent left lower lobe infiltrate. Persistent small bilateral pleural effusions greatest on the left. Given  that the patient has clinically improved, a follow-up PA and lateral chest X-ray is recommended in 3-4 weeks to ensure complete resolution and exclude underlying malignancy. Electronically Signed   By: David  Martinique M.D.   On: 04/22/2016 13:15      Patient Profile  45 year old male with past medical history of hypertension, diabetes mellitus, tobacco abuse admitted with congestive heart failure symptoms. Chest CT showed pleural effusions, coronary atherosclerosis and large pulmonary arteries. BNP 915. Echocardiogram shows severe LV dysfunction with ejection fraction 25-30%.   Assessment & Plan    1 acute congestive heart failure-patient's presentation is most consistent with CHF. Echo shows EF 25-30 and grade 2 diastolic dysfunction. He was severely hypertensive at time of admission. 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 He is also diabetic, there is coronary calcification on CT and possible septal infarct on electrocardiogram. Ischemia may be possible. Plan cardiac catheterization to exclude CAD (The risks and benefits including myocardial infarction, CVA and death discussed and he agrees to proceed). Given enlarged PA on CT will plan right heart cath as well. Continue lisinopril 10 mg daily. Increase carvedilol to 6.25 mg twice a day. We will likely add low-dose diuretic following procedure. Patient will need follow-up echocardiogram 3 months after medications titrated to see if LV function improved. Also note he consumes approximately 6-12 beers per weekend.  2 severe HTN-medication adjustment as outlined above. We will continue to titrate ACE inhibitor and beta blocker as tolerated. We discussed the importance of blood pressure control.   3 question pneumonia-antibiotics per primary care.  4 diabetes mellitus   5 elevated troponin-there is no clear trend and he is not having chest pain; not consistent with acute coronary syndrome. Cardiac cath as outlined above due to reduced LV  function.   6 abnormal electrocardiogram-plan as outlined under #1.  Signed, Kirk Ruths, MD  04/23/2016, 8:14 AM

## 2016-04-23 NOTE — Interval H&P Note (Signed)
Cath Lab Visit (complete for each Cath Lab visit)  Clinical Evaluation Leading to the Procedure:   ACS: No.  Non-ACS:    Anginal Classification: CCS Roth  Anti-ischemic medical therapy: Minimal Therapy (1 class of medications)  Non-Invasive Test Results: No non-invasive testing performed  Prior CABG: No previous CABG      History and Physical Interval Note:  04/23/2016 4:05 PM  Christopher Roth  has presented today for surgery, with the diagnosis of cp  The various methods of treatment have been discussed with the patient and family. After consideration of risks, benefits and other options for treatment, the patient has consented to  Procedure(s): Right/Left Heart Cath and Coronary Angiography (N/A) as a surgical intervention .  The patient's history has been reviewed, patient examined, no change in status, stable for surgery.  I have reviewed the patient's chart and labs.  Questions were answered to the patient's satisfaction.     Christopher Roth

## 2016-04-23 NOTE — Progress Notes (Signed)
Patient sitting on bed, family present at bedside. No other needs at this time, call light within reach

## 2016-04-23 NOTE — Progress Notes (Signed)
[facility] PROGRESS NOTE  Christopher Roth HWY:[id number] DOB: 06-16-1970 DOA: 04/20/2016  PCP: Helane Rima, MD  Subjective/Interval History: Feels much better, slept all night yesterday without orthopnea  Brief History/Interval Summary: 45 y.o. male with history of diabetes mellitus type 2, hypertension presently off medications, tobacco abuse quit 2 weeks ago presented to the ER because of shortness of breath. Patient has been having productive cough for last couple of months and has been on at least on 3 courses of antibiotics. He finished a course recently. Patient was found to have multifocal pneumonia on CT scan. Bilateral pleural effusions were also noted. Patient was hospitalized for further management.  Reason for Visit: Acute CHF  Consultants:   Procedures:  Transthoracic echocardiogram Study Conclusions  - Left ventricle: The cavity size was normal. There was mild   concentric hypertrophy. Systolic function was severely reduced.   The estimated ejection fraction was in the range of 25% to 30%.   There is akinesis of the entireapical myocardium. There is   akinesis of the entireanteroseptal, inferior, and inferoseptal   myocardium. There is severe hypokinesis of the anterior   myocardium. Features are consistent with a pseudonormal left   ventricular filling pattern, with concomitant abnormal relaxation   and increased filling pressure (grade 2 diastolic dysfunction).   Doppler parameters are consistent with high ventricular filling   pressure. - Left atrium: The atrium was mildly dilated. - Pulmonary arteries: Systolic pressure could not be accurately   estimated. - Pericardium, extracardiac: A trivial, free-flowing pericardial   effusion was identified along the right atrial free wall. The   fluid had no internal echoes.  Antibiotics: Ceftriaxone and azithromycin   ROS: Denies any nausea or vomiting  Objective:  Vital Signs  Vitals:   04/22/16  2136 04/23/16 0421 04/23/16 0538 04/23/16 0914  BP: 134/81  127/87 136/88  Pulse: 77  83 75  Resp: 18  18   Temp: 98.7 F (37.1 C)  98.6 F (37 C)   TempSrc: Oral  Oral   SpO2: 98%  100%   Weight:  96 kg (211 lb 10.3 oz)    Height:        Intake/Output Summary (Last 24 hours) at 04/23/16 1252 Last data filed at 04/22/16 1729  Gross per 24 hour  Intake              120 ml  Output                0 ml  Net              120 ml   Filed Weights   04/20/16 1901 04/22/16 0617 04/23/16 0421  Weight: 94.8 kg (209 lb) 95 kg (209 lb 7 oz) 96 kg (211 lb 10.3 oz)    General appearance: alert, cooperative, appears stated age and no distress Resp: Improved air entry bilaterally. No crackles appreciated today. Some dullness to percussion at the bases. Cardio: regular rate and rhythm, S1, S2 normal, no murmur, click, rub or gallop GI: soft, non-tender; bowel sounds normal; no masses,  no organomegaly Extremities: extremities normal, atraumatic, no cyanosis or edema Neurologic: Alert and oriented X 3, no focal deficits  Lab Results:  Data Reviewed: I have personally reviewed following labs and imaging studies  CBC:  Recent Labs Lab 04/20/16 1906 04/21/16 0457 04/22/16 0454 04/23/16 0457  WBC 9.4 9.3 6.6 5.9  NEUTROABS  --  6.4  --   --   HGB 12.6* 12.9* 11.8* 11.3*  HCT 36.2* 37.8* 34.3* 33.6*  MCV 84.0 83.4 82.7 84.6  PLT 442* 401* 351 893    Basic Metabolic Panel:  Recent Labs Lab 04/20/16 1906 04/21/16 0457 04/22/16 0454 04/23/16 0457  NA 138 138 140 140  K 3.8 3.6 3.3* 3.7  CL 106 105 105 108  CO2 24 26 25 24   GLUCOSE 148* 164* 126* 136*  BUN 6 7 8 11   CREATININE 1.00 0.92 0.97 0.94  CALCIUM 9.3 9.4 9.0 9.2    GFR: Estimated Creatinine Clearance: 111.5 mL/min (by C-G formula based on SCr of 0.94 mg/dL).  Liver Function Tests:  Recent Labs Lab 04/21/16 0457  AST 17  ALT 23  ALKPHOS 70  BILITOT 1.1  PROT 6.7  ALBUMIN 3.8    Cardiac  Enzymes:  Recent Labs Lab 04/21/16 0457 04/21/16 1017 04/21/16 1556  TROPONINI 0.15* 0.14* 0.14*    CBG:  Recent Labs Lab 04/22/16 1230 04/22/16 1741 04/22/16 2148 04/23/16 0846 04/23/16 1143  GLUCAP 97 116* 168* 153* 166*    Thyroid Function Tests:  Recent Labs  04/21/16 0457  TSH 1.387     Radiology Studies: Dg Chest 2 View  Result Date: 04/22/2016 CLINICAL DATA:  Follow-up recent episode of pneumonia. The patient has completed antibiotics. Patient reports only mild lingering cough. EXAM: CHEST  2 VIEW COMPARISON:  CT scan of the chest of April 20, 2016 and chest x-ray of the same day. FINDINGS: The lungs are adequately inflated. There is patchy increased density in the left lower lobe. There is a small left pleural effusion and smaller right pleural effusion. There has been improvement in the appearance of the pulmonary interstitium at the right lung base. The heart is normal in size. The pulmonary vascularity is not engorged. The mediastinum is normal in width. The bony thorax is unremarkable. IMPRESSION: Persistent left lower lobe infiltrate. Persistent small bilateral pleural effusions greatest on the left. Given that the patient has clinically improved, a follow-up PA and lateral chest X-ray is recommended in 3-4 weeks to ensure complete resolution and exclude underlying malignancy. Electronically Signed   By: David  Martinique M.D.   On: 04/22/2016 13:15     Medications:  Scheduled: . aspirin  325 mg Oral Daily  . atorvastatin  80 mg Oral q1800  . azithromycin  500 mg Intravenous QHS  . carvedilol  6.25 mg Oral BID WC  . cefTRIAXone (ROCEPHIN)  IV  1 g Intravenous QHS  . enoxaparin (LOVENOX) injection  40 mg Subcutaneous Q24H  . glipiZIDE  10 mg Oral Q breakfast  . hydrALAZINE  10 mg Intravenous Once  . insulin aspart  0-9 Units Subcutaneous TID WC  . lisinopril  10 mg Oral Daily  . sodium chloride flush  3 mL Intravenous Q12H   Continuous: . [START ON  04/24/2016] sodium chloride     YBO:FBPZWC chloride, acetaminophen **OR** acetaminophen, hydrALAZINE, ondansetron **OR** ondansetron (ZOFRAN) IV, pneumococcal 23 valent vaccine, sodium chloride flush  Assessment/Plan:  Principal Problem:   Acute systolic congestive heart failure (HCC) Active Problems:   Essential hypertension   Diabetes mellitus type II, uncontrolled ([place])   Community acquired pneumonia   Elevated troponin   Pneumonia    Acute respiratory failure with hypoxia. Initially etiology was thought to be due to multifocal pneumonia noted on CT scan.  This is however is likely secondary to acute pulmonary edema from acute CHF. This is resolved, currently on room air, was on supplemental oxygen on admission.  Acute combined systolic and diastolic  CHF. EF is 25-30% and grade 2 diastolic dysfunction. This is a new diagnosis. Cardiology consulted, unclear etiology could be ischemic versus nonischemic cardiomyopathy secondary to HTN. Cardiac catheterization scheduled for 12/20 Started on Coreg, lisinopril and diuretics, cardiology adjusting the discharge medications.  Multifocal pneumonia Opacities on the x-ray, read as multifocal pneumonia. Patient does not have fever or leukocytosis. CT shows bilateral effusion and patchy airspace disease suggesting pneumonia. I will discontinue antibiotics, follow patient clinically as this is probably secondary to volume overload.  Accelerated hypertension Blood pressure was poorly controlled. Patient has been started back on lisinopril. Hydralazine as needed. Blood pressure has improved. Continue to monitor.  Chest tightness with minimal troponin elevation and abnormal EKG Denies any discomfort. Abnormal echocardiogram as noted above. Troponins noted to be minimally elevated. D-dimer was high, but CT scan which was done with contrast did not show any PE. Continue aspirin. Wait for cardiology input in view of his newly detected CHF. LDL  125. Will initiate statin.  Diabetes mellitus type 2. Last HbA1c was 9. Recently started on glipizide. Also on metformin, which is being held. Sliding scale insulin coverage.  History of tobacco abuse. Apparently, patient quit 2 weeks ago. Continue to emphasize cessation.  Bilateral pulmonary nodules. Detected on CT scan. Will need close outpatient monitoring.  DVT Prophylaxis: Lovenox    Code Status: Full code  Family Communication: Discussed with the patient  Disposition Plan: Management as outlined above. Await cardiology input.   LOS: 2 days   [facility] Pager [phone] 04/23/2016, 12:52 PM  If 7PM-7AM, please contact night-coverage at [email or web address], password [facility]

## 2016-04-23 NOTE — Progress Notes (Signed)
Patient Name: Christopher Roth Date of Encounter: 04/23/2016  Primary Cardiologist: Dr [facility] Problem List     Principal Problem:   Acute systolic congestive heart failure [facility]) Active Problems:   Essential hypertension   Diabetes mellitus type II, uncontrolled ([place])   Community acquired pneumonia   Elevated troponin   Pneumonia     Subjective   No chest pain or dyspnea  Inpatient Medications    Scheduled Meds: . aspirin  325 mg Oral Daily  . atorvastatin  80 mg Oral q1800  . azithromycin  500 mg Intravenous QHS  . carvedilol  3.125 mg Oral BID WC  . cefTRIAXone (ROCEPHIN)  IV  1 g Intravenous QHS  . enoxaparin (LOVENOX) injection  40 mg Subcutaneous Q24H  . glipiZIDE  10 mg Oral Q breakfast  . hydrALAZINE  10 mg Intravenous Once  . insulin aspart  0-9 Units Subcutaneous TID WC  . lisinopril  10 mg Oral Daily  . sodium chloride flush  3 mL Intravenous Q12H   Continuous Infusions: . [START ON 04/24/2016] sodium chloride     PRN Meds: sodium chloride, acetaminophen **OR** acetaminophen, hydrALAZINE, ondansetron **OR** ondansetron (ZOFRAN) IV, pneumococcal 23 valent vaccine, sodium chloride flush   Vital Signs    Vitals:   04/22/16 1601 04/22/16 2136 04/23/16 0421 04/23/16 0538  BP: 135/88 134/81  127/87  Pulse: 76 77  83  Resp: 20 18  18   Temp:  98.7 F (37.1 C)  98.6 F (37 C)  TempSrc:  Oral  Oral  SpO2: 99% 98%  100%  Weight:   211 lb 10.3 oz (96 kg)   Height:        Intake/Output Summary (Last 24 hours) at 04/23/16 0814 Last data filed at 04/22/16 1729  Gross per 24 hour  Intake              360 ml  Output                0 ml  Net              360 ml   Filed Weights   04/20/16 1901 04/22/16 0617 04/23/16 0421  Weight: 209 lb (94.8 kg) 209 lb 7 oz (95 kg) 211 lb 10.3 oz (96 kg)    Physical Exam    GEN: Well nourished, well developed, in no acute distress.  HEENT: Grossly normal.  Neck: Supple Cardiac: RRR; no edema Respiratory:   CTA GI: Soft, nontender, nondistended. MS: no deformity or atrophy. Skin: warm and dry, no rash. Neuro:  Strength and sensation are intact.   Labs    CBC  Recent Labs  04/21/16 0457 04/22/16 0454 04/23/16 0457  WBC 9.3 6.6 5.9  NEUTROABS 6.4  --   --   HGB 12.9* 11.8* 11.3*  HCT 37.8* 34.3* 33.6*  MCV 83.4 82.7 84.6  PLT 401* 351 409   Basic Metabolic Panel  Recent Labs  04/22/16 0454 04/23/16 0457  NA 140 140  K 3.3* 3.7  CL 105 108  CO2 25 24  GLUCOSE 126* 136*  BUN 8 11  CREATININE 0.97 0.94  CALCIUM 9.0 9.2   Liver Function Tests  Recent Labs  04/21/16 0457  AST 17  ALT 23  ALKPHOS 70  BILITOT 1.1  PROT 6.7  ALBUMIN 3.8   Cardiac Enzymes  Recent Labs  04/21/16 0457 04/21/16 1017 04/21/16 1556  TROPONINI 0.15* 0.14* 0.14*   D-Dimer  Recent Labs  04/21/16 8119  DDIMER 1.32*   Fasting Lipid Panel  Recent Labs  04/22/16 0454  CHOL 193  HDL 52  LDLCALC 125*  TRIG 80  CHOLHDL 3.7   Thyroid Function Tests  Recent Labs  04/21/16 0457  TSH 1.387    Telemetry    Sinus with PVCs - Personally Reviewed   Radiology    Dg Chest 2 View  Result Date: 04/22/2016 CLINICAL DATA:  Follow-up recent episode of pneumonia. The patient has completed antibiotics. Patient reports only mild lingering cough. EXAM: CHEST  2 VIEW COMPARISON:  CT scan of the chest of April 20, 2016 and chest x-ray of the same day. FINDINGS: The lungs are adequately inflated. There is patchy increased density in the left lower lobe. There is a small left pleural effusion and smaller right pleural effusion. There has been improvement in the appearance of the pulmonary interstitium at the right lung base. The heart is normal in size. The pulmonary vascularity is not engorged. The mediastinum is normal in width. The bony thorax is unremarkable. IMPRESSION: Persistent left lower lobe infiltrate. Persistent small bilateral pleural effusions greatest on the left. Given  that the patient has clinically improved, a follow-up PA and lateral chest X-ray is recommended in 3-4 weeks to ensure complete resolution and exclude underlying malignancy. Electronically Signed   By: David  Martinique M.D.   On: 04/22/2016 13:15      Patient Profile  45 year old male with past medical history of hypertension, diabetes mellitus, tobacco abuse admitted with congestive heart failure symptoms. Chest CT showed pleural effusions, coronary atherosclerosis and large pulmonary arteries. BNP 915. Echocardiogram shows severe LV dysfunction with ejection fraction 25-30%.   Assessment & Plan    1 acute congestive heart failure-patient's presentation is most consistent with CHF. Echo shows EF 25-30 and grade 2 diastolic dysfunction. He was severely hypertensive at time of admission. 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 He is also diabetic, there is coronary calcification on CT and possible septal infarct on electrocardiogram. Ischemia may be possible. Plan cardiac catheterization to exclude CAD (The risks and benefits including myocardial infarction, CVA and death discussed and he agrees to proceed). Given enlarged PA on CT will plan right heart cath as well. Continue lisinopril 10 mg daily. Increase carvedilol to 6.25 mg twice a day. We will likely add low-dose diuretic following procedure. Patient will need follow-up echocardiogram 3 months after medications titrated to see if LV function improved. Also note he consumes approximately 6-12 beers per weekend.  2 severe HTN-medication adjustment as outlined above. We will continue to titrate ACE inhibitor and beta blocker as tolerated. We discussed the importance of blood pressure control.   3 question pneumonia-antibiotics per primary care.  4 diabetes mellitus   5 elevated troponin-there is no clear trend and he is not having chest pain; not consistent with acute coronary syndrome. Cardiac cath as outlined above due to reduced LV  function.   6 abnormal electrocardiogram-plan as outlined under #1.  Signed, Kirk Ruths, MD  04/23/2016, 8:14 AM

## 2016-04-24 ENCOUNTER — Encounter (HOSPITAL_COMMUNITY): Payer: Self-pay | Admitting: Interventional Cardiology

## 2016-04-24 DIAGNOSIS — E1159 Type 2 diabetes mellitus with other circulatory complications: Secondary | ICD-10-CM

## 2016-04-24 LAB — BASIC METABOLIC PANEL
Anion gap: 9 (ref 5–15)
BUN: 10 mg/dL (ref 6–20)
CHLORIDE: 107 mmol/L (ref 101–111)
CO2: 24 mmol/L (ref 22–32)
Calcium: 9.2 mg/dL (ref 8.9–10.3)
Creatinine, Ser: 0.94 mg/dL (ref 0.61–1.24)
GFR calc Af Amer: >60 mL/min (ref >60)
GFR calc non Af Amer: >60 mL/min (ref >60)
GLUCOSE: 137 mg/dL — AB (ref 65–99)
POTASSIUM: 3.5 mmol/L (ref 3.5–5.1)
Sodium: 140 mmol/L (ref 135–145)

## 2016-04-24 LAB — GLUCOSE, CAPILLARY
Glucose-Capillary: 133 mg/dL — ABNORMAL HIGH (ref 65–99)
Glucose-Capillary: 174 mg/dL — ABNORMAL HIGH (ref 65–99)
Glucose-Capillary: 213 mg/dL — ABNORMAL HIGH (ref 65–99)
Glucose-Capillary: 113 mg/dL — ABNORMAL HIGH (ref 65–99)

## 2016-04-24 LAB — CBC
HEMATOCRIT: 34 % — AB (ref 39.0–52.0)
Hemoglobin: 11.6 g/dL — ABNORMAL LOW (ref 13.0–17.0)
MCH: 28.4 pg (ref 26.0–34.0)
MCHC: 34.1 g/dL (ref 30.0–36.0)
MCV: 83.1 fL (ref 78.0–100.0)
Platelets: 337 10*3/uL (ref 150–400)
RBC: 4.09 MIL/uL — ABNORMAL LOW (ref 4.22–5.81)
RDW: 13.9 % (ref 11.5–15.5)
WBC: 6.4 10*3/uL (ref 4.0–10.5)

## 2016-04-24 MED ORDER — FUROSEMIDE 20 MG PO TABS
20.0000 mg | ORAL_TABLET | Freq: Every day | ORAL | Status: DC
  Administered 2016-04-24 – 2016-04-25 (×2): 20 mg via ORAL
  Filled 2016-04-24 (×2): qty 1

## 2016-04-24 MED ORDER — SPIRONOLACTONE 25 MG PO TABS
12.5000 mg | ORAL_TABLET | Freq: Every day | ORAL | Status: DC
  Administered 2016-04-24 – 2016-04-25 (×2): 12.5 mg via ORAL
  Filled 2016-04-24 (×2): qty 1

## 2016-04-24 NOTE — Progress Notes (Signed)
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 Feels okay, denies any complaints. LifeVest recommended, patient will not be discharged until this is provided  Brief History/Interval Summary: 45 y.o. male with history of diabetes mellitus type 2, hypertension presently off medications, tobacco abuse quit 2 weeks ago presented to the ER because of shortness of breath. Patient has been having productive cough for last couple of months and has been on at least on 3 courses of antibiotics. He finished a course recently. Patient was found to have multifocal pneumonia on CT scan. Bilateral pleural effusions were also noted. Patient was hospitalized for further management.  Reason for Visit: Acute CHF  Consultants:   Procedures:  Transthoracic echocardiogram Study Conclusions  - Left ventricle: The cavity size was normal. There was mild   concentric hypertrophy. Systolic function was severely reduced.   The estimated ejection fraction was in the range of 25% to 30%.   There is akinesis of the entireapical myocardium. There is   akinesis of the entireanteroseptal, inferior, and inferoseptal   myocardium. There is severe hypokinesis of the anterior   myocardium. Features are consistent with a pseudonormal left   ventricular filling pattern, with concomitant abnormal relaxation   and increased filling pressure (grade 2 diastolic dysfunction).   Doppler parameters are consistent with high ventricular filling   pressure. - Left atrium: The atrium was mildly dilated. - Pulmonary arteries: Systolic pressure could not be accurately   estimated. - Pericardium, extracardiac: A trivial, free-flowing pericardial   effusion was identified along the right atrial free wall. The   fluid had  no internal echoes.  Antibiotics: Ceftriaxone and azithromycin   ROS: Denies any nausea or vomiting  Objective:  Vital Signs  Vitals:   04/23/16 1730 04/23/16 2107 04/24/16 0436 04/24/16 1044  BP: 133/83 (!) 166/102 116/78 (!) 122/91  Pulse: 74 86 83 82  Resp: 18 18 18    Temp: 97.8 F (36.6 C) 97.5 F (36.4 C) 98 F (36.7 C)   TempSrc: Oral Oral Oral   SpO2: 93% 100% 93%   Weight:   90.7 kg (200 lb)   Height:       No intake or output data in the 24 hours ending 04/24/16 1118 Filed Weights   04/22/16 0617 04/23/16 0421 04/24/16 0436  Weight: 95 kg (209 lb 7 oz) 96 kg (211 lb 10.3 oz) 90.7 kg (200 lb)    General appearance: alert, cooperative, appears stated age and no distress Resp: Improved air entry bilaterally. No crackles appreciated today. Some dullness to percussion at the bases. Cardio: regular rate and rhythm, S1, S2 normal, no murmur, click, rub or gallop GI: soft, non-tender; bowel sounds normal; no masses,  no organomegaly Extremities: extremities normal, atraumatic, no cyanosis or edema Neurologic: Alert and oriented X 3, no focal deficits  Lab Results:  Data Reviewed: I have personally reviewed following labs and imaging studies  CBC:  Recent Labs Lab 04/20/16 1906 04/21/16 0457 04/22/16 0454 04/23/16 0457 04/24/16 0252  WBC 9.4 9.3 6.6 5.9 6.4  NEUTROABS  --  6.4  --   --   --   HGB 12.6* 12.9* 11.8* 11.3* 11.6*  HCT 36.2* 37.8* 34.3* 33.6* 34.0*  MCV 84.0 83.4 82.7 84.6 83.1  PLT 442* 401* 351 300 256    Basic Metabolic Panel:  Recent Labs Lab  04/20/16 1906 04/21/16 0457 04/22/16 0454 04/23/16 0457 04/24/16 0252  NA 138 138 140 140 140  K 3.8 3.6 3.3* 3.7 3.5  CL 106 105 105 108 107  CO2 24 26 25 24 24   GLUCOSE 148* 164* 126* 136* 137*  BUN 6 7 8 11 10   CREATININE 1.00 0.92 0.97 0.94 0.94  CALCIUM 9.3 9.4 9.0 9.2 9.2    GFR: Estimated Creatinine Clearance: 108.5 mL/min (by C-G formula based on SCr of 0.94 mg/dL).  Liver  Function Tests:  Recent Labs Lab 04/21/16 0457  AST 17  ALT 23  ALKPHOS 70  BILITOT 1.1  PROT 6.7  ALBUMIN 3.8    Cardiac Enzymes:  Recent Labs Lab 04/21/16 0457 04/21/16 1017 04/21/16 1556  TROPONINI 0.15* 0.14* 0.14*    CBG:  Recent Labs Lab 04/22/16 2148 04/23/16 0846 04/23/16 1143 04/23/16 2110 04/24/16 0611  GLUCAP 168* 153* 166* 192* 133*    Thyroid Function Tests: No results for input(s): TSH, T4TOTAL, FREET4, T3FREE, THYROIDAB in the last 72 hours.   Radiology Studies: Dg Chest 2 View  Result Date: 04/22/2016 CLINICAL DATA:  Follow-up recent episode of pneumonia. The patient has completed antibiotics. Patient reports only mild lingering cough. EXAM: CHEST  2 VIEW COMPARISON:  CT scan of the chest of April 20, 2016 and chest x-ray of the same day. FINDINGS: The lungs are adequately inflated. There is patchy increased density in the left lower lobe. There is a small left pleural effusion and smaller right pleural effusion. There has been improvement in the appearance of the pulmonary interstitium at the right lung base. The heart is normal in size. The pulmonary vascularity is not engorged. The mediastinum is normal in width. The bony thorax is unremarkable. IMPRESSION: Persistent left lower lobe infiltrate. Persistent small bilateral pleural effusions greatest on the left. Given that the patient has clinically improved, a follow-up PA and lateral chest X-ray is recommended in 3-4 weeks to ensure complete resolution and exclude underlying malignancy. Electronically Signed   By: David  Martinique M.D.   On: 04/22/2016 13:15     Medications:  Scheduled: . aspirin  325 mg Oral Daily  . atorvastatin  80 mg Oral q1800  . carvedilol  6.25 mg Oral BID WC  . enoxaparin (LOVENOX) injection  40 mg Subcutaneous Q24H  . furosemide  20 mg Oral Daily  . glipiZIDE  10 mg Oral Q breakfast  . guaiFENesin  1,200 mg Oral BID  . hydrALAZINE  10 mg Intravenous Once  . insulin  aspart  0-9 Units Subcutaneous TID WC  . sodium chloride flush  3 mL Intravenous Q12H  . sodium chloride flush  3 mL Intravenous Q12H  . spironolactone  12.5 mg Oral Daily   Continuous:  VZD:GLOVFI chloride, sodium chloride, acetaminophen **OR** acetaminophen, hydrALAZINE, ondansetron **OR** ondansetron (ZOFRAN) IV, pneumococcal 23 valent vaccine, sodium chloride flush, sodium chloride flush  Assessment/Plan:  Principal Problem:   Acute systolic congestive heart failure (HCC) Active Problems:   Essential hypertension   Diabetes mellitus type II, uncontrolled ([place])   Community acquired pneumonia   Elevated troponin   Pneumonia   Congestive heart failure, unspecified    Acute respiratory failure with hypoxia. Initially etiology was thought to be due to multifocal pneumonia noted on CT scan.  This is however is likely secondary to acute pulmonary edema from acute CHF. This is resolved, currently on room air, was on supplemental oxygen on admission.  Acute combined systolic and diastolic CHF. EF is 25-30% and  grade 2 diastolic dysfunction. This is a new diagnosis. Cardiology consulted, unclear etiology could be ischemic versus nonischemic cardiomyopathy secondary to HTN. Cardiac catheterization done on 10/20 and showed total occlusion of the mid LAD and other partial occlusion. Started on Coreg, lisinopril and diuretics, cardiology adjusting the discharge medications.  Multifocal pneumonia Opacities on the x-ray, read as multifocal pneumonia. Patient does not have fever or leukocytosis. CT shows bilateral effusion and patchy airspace disease suggesting pneumonia. I will discontinue antibiotics, follow patient clinically as this is probably secondary to volume overload.  Accelerated hypertension Blood pressure was poorly controlled. Patient has been started back on lisinopril. Hydralazine as needed. Blood pressure has improved. Continue to monitor.  Chest tightness with minimal  troponin elevation and abnormal EKG Denies any discomfort. Abnormal echocardiogram as noted above. Troponins noted to be minimally elevated. D-dimer was high, but CT scan which was done with contrast did not show any PE. Continue aspirin. Wait for cardiology input in view of his newly detected CHF. LDL 125. Will initiate statin.  Diabetes mellitus type 2. Last HbA1c was 9. Recently started on glipizide. Also on metformin, which is being held. Sliding scale insulin coverage.  History of tobacco abuse. Apparently, patient quit 2 weeks ago. Continue to emphasize cessation.  Bilateral pulmonary nodules. Detected on CT scan. Will need close outpatient monitoring.  DVT Prophylaxis: Lovenox    Code Status: Full code  Family Communication: Discussed with the patient  Disposition Plan: Discussed with cardiology, needs life vest prior to discharge, Entresto 36 hours.   LOS: 3 days   [facility] A  [facility] Pager [phone] 04/24/2016, 11:18 AM  If 7PM-7AM, please contact night-coverage at [email or web address], password [facility]

## 2016-04-24 NOTE — Progress Notes (Addendum)
Patient Name: Christopher Roth Date of Encounter: 04/24/2016  Primary Cardiologist: Dr [facility] Problem List     Principal Problem:   Acute systolic congestive heart failure [facility]) Active Problems:   Essential hypertension   Diabetes mellitus type II, uncontrolled ([place])   Community acquired pneumonia   Elevated troponin   Pneumonia   Congestive heart failure, unspecified     Subjective   No chest pain or dyspnea  Inpatient Medications    Scheduled Meds: . aspirin  325 mg Oral Daily  . atorvastatin  80 mg Oral q1800  . carvedilol  6.25 mg Oral BID WC  . enoxaparin (LOVENOX) injection  40 mg Subcutaneous Q24H  . glipiZIDE  10 mg Oral Q breakfast  . guaiFENesin  1,200 mg Oral BID  . hydrALAZINE  10 mg Intravenous Once  . insulin aspart  0-9 Units Subcutaneous TID WC  . lisinopril  10 mg Oral Daily  . sodium chloride flush  3 mL Intravenous Q12H  . sodium chloride flush  3 mL Intravenous Q12H   Continuous Infusions:  PRN Meds: sodium chloride, sodium chloride, acetaminophen **OR** acetaminophen, hydrALAZINE, ondansetron **OR** ondansetron (ZOFRAN) IV, pneumococcal 23 valent vaccine, sodium chloride flush, sodium chloride flush   Vital Signs    Vitals:   04/23/16 1653 04/23/16 1730 04/23/16 2107 04/24/16 0436  BP: (!) 137/96 133/83 (!) 166/102 116/78  Pulse: 73 74 86 83  Resp: 13 18 18 18   Temp:  97.8 F (36.6 C) 97.5 F (36.4 C) 98 F (36.7 C)  TempSrc:  Oral Oral Oral  SpO2: 93% 93% 100% 93%  Weight:    200 lb (90.7 kg)  Height:       No intake or output data in the 24 hours ending 04/24/16 0914 Filed Weights   04/22/16 0617 04/23/16 0421 04/24/16 0436  Weight: 209 lb 7 oz (95 kg) 211 lb 10.3 oz (96 kg) 200 lb (90.7 kg)    Physical Exam    GEN: Well nourished, well developed, in no acute distress.  HEENT: Grossly normal.  Neck: Supple Cardiac: RRR; no edema; radial cath site with no edema Respiratory:  CTA GI: Soft, nontender,  nondistended. MS: no deformity or atrophy. Skin: warm and dry, no rash. Neuro:  Strength and sensation are intact.   Labs    CBC  Recent Labs  04/23/16 0457 04/24/16 0252  WBC 5.9 6.4  HGB 11.3* 11.6*  HCT 33.6* 34.0*  MCV 84.6 83.1  PLT 300 262   Basic Metabolic Panel  Recent Labs  04/23/16 0457 04/24/16 0252  NA 140 140  K 3.7 3.5  CL 108 107  CO2 24 24  GLUCOSE 136* 137*  BUN 11 10  CREATININE 0.94 0.94  CALCIUM 9.2 9.2   Cardiac Enzymes  Recent Labs  04/21/16 1017 04/21/16 1556  TROPONINI 0.14* 0.14*   Fasting Lipid Panel  Recent Labs  04/22/16 0454  CHOL 193  HDL 52  LDLCALC 125*  TRIG 80  CHOLHDL 3.7     Telemetry    Sinus with PVCs - Personally Reviewed   Radiology    Dg Chest 2 View  Result Date: 04/22/2016 CLINICAL DATA:  Follow-up recent episode of pneumonia. The patient has completed antibiotics. Patient reports only mild lingering cough. EXAM: CHEST  2 VIEW COMPARISON:  CT scan of the chest of April 20, 2016 and chest x-ray of the same day. FINDINGS: The lungs are adequately inflated. There is patchy increased density in the left lower  lobe. There is a small left pleural effusion and smaller right pleural effusion. There has been improvement in the appearance of the pulmonary interstitium at the right lung base. The heart is normal in size. The pulmonary vascularity is not engorged. The mediastinum is normal in width. The bony thorax is unremarkable. IMPRESSION: Persistent left lower lobe infiltrate. Persistent small bilateral pleural effusions greatest on the left. Given that the patient has clinically improved, a follow-up PA and lateral chest X-ray is recommended in 3-4 weeks to ensure complete resolution and exclude underlying malignancy. Electronically Signed   By: David  Martinique M.D.   On: 04/22/2016 13:15      Patient Profile  45 year old male with past medical history of hypertension, diabetes mellitus, tobacco abuse  admitted with congestive heart failure symptoms. Chest CT showed pleural effusions, coronary atherosclerosis and large pulmonary arteries. BNP 915. Echocardiogram shows severe LV dysfunction with ejection fraction 25-30%. Cath shows recently occluded LAD, 70 Lcx and 90 PDA. EF 10-15; LVEDP 31.   Assessment & Plan    1 acute systolic congestive heart failure/cardiomyopathy-patient's presentation is most consistent with CHF. Echo shows EF 25-30 and grade 2 diastolic dysfunction. Cardiac catheterization shows recently occluded LAD. Likely ischemic cardiomyopathy although cardiomyopathy is felt out of proportion to coronary disease so may be mixed. Continue carvedilol 6.25 mg twice a day. Discontinue lisinopril and add entresto 24/26 BID in 36 hrs. Add lasix 20 mg daily. Add spironolactone 12.5 mg daily. Patient will need follow-up echocardiogram 3 months after medications titrated to see if LV function improved. Pt will need fu in CHF clinic (I discussed with Dr [name]); will arrange life vest; Will need ICD if EF < 35 3 months after med titration.   2 severe HTN-medication adjustment as outlined above. We will continue to titrate meds as tolerated.   3 question pneumonia-antibiotics per primary care.  4 diabetes mellitus   5 CAD-Treat with ASA and statin.  Signed, Kirk Ruths, MD  04/24/2016, 9:14 AM

## 2016-04-24 NOTE — Progress Notes (Signed)
Heart Failure Navigator Consult Note  Presentation: Christopher Roth is a 45 y.o. male with history of diabetes mellitus type 2, hypertension presently off medications, tobacco abuse quit 2 weeks ago presents to the ER because of shortness of breath. Patient has been having productive cough for last couple of months and has been on at least on 3 courses of antibiotics last one was finished yesterday. Patient has been having shortness of breath mostly on lying down with some chest tightness off and on. Presently chest pain-free. In the ER just x-ray was showing infiltrates followed by CT chest done with contrast shows multifocal pneumonia with bilateral pleural effusion and nodules and pulmonary hypertension. Patient's blood pressure also was elevated on admission. Troponin is mildly elevated. Patient is being admitted for respiratory failure probably from pneumonia with possible CHF.   Past Medical History:  Diagnosis Date  . Acute systolic congestive heart failure ([place]) 04/22/2016  . Diabetes mellitus without complication ([place])   . Hypertension   . Tobacco use disorder 11/21/2014    Social History   Social History  . Marital status: Married    Spouse name: N/A  . Number of children: N/A  . Years of education: N/A   Social History Main Topics  . Smoking status: Current Every Day Smoker    Packs/day: 1.00    Years: 24.00    Types: Cigarettes  . Smokeless tobacco: Never Used  . Alcohol use Yes  . Drug use: No  . Sexual activity: Not Asked   Other Topics Concern  . None   Social History Narrative  . None    ECHO:Study Conclusions--04/21/16  - Left ventricle: The cavity size was normal. There was mild   concentric hypertrophy. Systolic function was severely reduced.   The estimated ejection fraction was in the range of 25% to 30%.   There is akinesis of the entireapical myocardium. There is   akinesis of the entireanteroseptal, inferior, and inferoseptal   myocardium. There is  severe hypokinesis of the anterior   myocardium. Features are consistent with a pseudonormal left   ventricular filling pattern, with concomitant abnormal relaxation   and increased filling pressure (grade 2 diastolic dysfunction).   Doppler parameters are consistent with high ventricular filling   pressure. - Left atrium: The atrium was mildly dilated. - Pulmonary arteries: Systolic pressure could not be accurately   estimated. - Pericardium, extracardiac: A trivial, free-flowing pericardial   effusion was identified along the right atrial free wall. The   fluid had no internal echoes.  ------------------------------------------------------------------- Study data:  No prior study was available for comparison.  Study status:  Routine.  Procedure:  The patient reported no pain pre or post test. Transthoracic echocardiography. Image quality was adequate.  Study completion:  There were no complications. Transthoracic echocardiography.  M-mode, complete 2D, spectral Doppler, and color Doppler.  Birthdate:  Patient birthdate: 1970-11-11.  Age:  Patient is 45 yr old.  Sex:  Gender: male. BMI: 31.8 kg/m^2.  Blood pressure:     162/97  Patient status: Inpatient.  Study date:  Study date: 04/21/2016. Study time: 03:22 PM.  Location:  Bedside.  04/23/16 Cardiac cath- Conclusion    Recent total occlusion of the proximal to mid LAD.  70% ramus intermedius, 65% ostial circumflex followed by 70% mid circumflex, 75% proximal LAD prior to the origin of a small to moderate size diagonal, and 90% mid PDA.  Mild pulmonary hypertension with pulmonary capillary wedge of 14 mmHg.  Severe LV dysfunction with EF  10-15%. Marked elevation of left ventricular end-diastolic pressure. LV dysfunction is out of proportion to the degree of coronary disease. Consider superimposed myocarditis related to the recent upper respiratory illness.  RECOMMENDATIONS:   Advanced heart failure team  consultation  LifeVest prior to discharge.  Guideline mandated heart failure therapy  Guarded prognosis.     BNP    Component Value Date/Time   BNP 915.6 (H) 04/21/2016 0456    ProBNP No results found for: PROBNP   Education Assessment and Provision:  Detailed education and instructions provided on heart failure disease management including the following:  Signs and symptoms of Heart Failure When to call the physician Importance of daily weights Low sodium diet Fluid restriction Medication management Anticipated future follow-up appointments  Patient education given on each of the above topics.  Patient acknowledges understanding and acceptance of all instructions.  I spoke with Christopher Roth and his wife regarding his recent diagnosis of HF and current hospitalization.  He admits that he is quite overwhelmed and all information is new to him regarding HF.  He does not have a scale at home however his wife says that they will have one at the time of discharge.   I discussed the importance of daily weights as well as how weight increases relate to the signs and symptoms of HF.  I reviewed a low sodium diet and high sodium foods to avoid.  He was very interested, asked pertinent questions and seems motivated to make changes necessary for his health.  He denies any issues with getting or taking his prescribed medications.  He will follow with [facility] and I have given him a map with directions.  Education Materials:  "Living Better With Heart Failure" Booklet, Daily Weight Tracker Tool    High Risk Criteria for Readmission and/or Poor Patient Outcomes:  (Recommend Follow-up with Advanced Heart Failure Clinic)-- yes   EF <30%- 25-30% with grade 2 dias dys  2 or more admissions in 6 months- No -new HF  Difficult social situation- No  Demonstrates medication noncompliance- yes quit taking BP medications prior to hospitalization   Barriers of Care:  New HF, Knowledge and  compliance  Discharge Planning:   Plans to return to home with wife and son

## 2016-04-24 NOTE — Progress Notes (Signed)
Patient lying in bed, family at bedside. No other needs at this time, call light within reach

## 2016-04-25 DIAGNOSIS — I5041 Acute combined systolic (congestive) and diastolic (congestive) heart failure: Secondary | ICD-10-CM

## 2016-04-25 LAB — GLUCOSE, CAPILLARY
GLUCOSE-CAPILLARY: 180 mg/dL — AB (ref 65–99)
Glucose-Capillary: 144 mg/dL — ABNORMAL HIGH (ref 65–99)

## 2016-04-25 LAB — BASIC METABOLIC PANEL
ANION GAP: 7 (ref 5–15)
BUN: 10 mg/dL (ref 6–20)
CALCIUM: 9.2 mg/dL (ref 8.9–10.3)
CO2: 25 mmol/L (ref 22–32)
Chloride: 109 mmol/L (ref 101–111)
Creatinine, Ser: 0.99 mg/dL (ref 0.61–1.24)
GLUCOSE: 136 mg/dL — AB (ref 65–99)
Potassium: 3.9 mmol/L (ref 3.5–5.1)
Sodium: 141 mmol/L (ref 135–145)

## 2016-04-25 MED ORDER — ATORVASTATIN CALCIUM 80 MG PO TABS: 80.0000 mg | ORAL_TABLET | Freq: Every day | ORAL | 0 refills | Status: DC

## 2016-04-25 MED ORDER — SACUBITRIL-VALSARTAN 24-26 MG PO TABS
1.0000 | ORAL_TABLET | Freq: Two times a day (BID) | ORAL | Status: DC
  Filled 2016-04-25: qty 1

## 2016-04-25 MED ORDER — SACUBITRIL-VALSARTAN 24-26 MG PO TABS: 1.0000 | ORAL_TABLET | Freq: Two times a day (BID) | ORAL | 0 refills | Status: DC

## 2016-04-25 MED ORDER — FUROSEMIDE 20 MG PO TABS
20.0000 mg | ORAL_TABLET | Freq: Every day | ORAL | 0 refills | Status: DC
Start: 2016-04-26 — End: 2016-05-14

## 2016-04-25 MED ORDER — CARVEDILOL 6.25 MG PO TABS: 6.2500 mg | ORAL_TABLET | Freq: Two times a day (BID) | ORAL | 0 refills | Status: DC

## 2016-04-25 MED ORDER — ASPIRIN 325 MG PO TABS: 325.0000 mg | ORAL_TABLET | Freq: Every day | ORAL | 0 refills | Status: DC

## 2016-04-25 NOTE — Progress Notes (Signed)
Patient Name: Christopher Roth Date of Encounter: 04/25/2016  Primary Cardiologist: Dr [facility] Problem List     Principal Problem:   Acute systolic congestive heart failure [facility]) Active Problems:   Essential hypertension   Diabetes mellitus type II, uncontrolled ([place])   Community acquired pneumonia   Elevated troponin   Pneumonia   Congestive heart failure, unspecified     Subjective   No chest pain or dyspnea  Inpatient Medications    Scheduled Meds: . aspirin  325 mg Oral Daily  . atorvastatin  80 mg Oral q1800  . carvedilol  6.25 mg Oral BID WC  . enoxaparin (LOVENOX) injection  40 mg Subcutaneous Q24H  . furosemide  20 mg Oral Daily  . glipiZIDE  10 mg Oral Q breakfast  . guaiFENesin  1,200 mg Oral BID  . hydrALAZINE  10 mg Intravenous Once  . insulin aspart  0-9 Units Subcutaneous TID WC  . sodium chloride flush  3 mL Intravenous Q12H  . sodium chloride flush  3 mL Intravenous Q12H  . spironolactone  12.5 mg Oral Daily   Continuous Infusions:  PRN Meds: sodium chloride, sodium chloride, acetaminophen **OR** acetaminophen, hydrALAZINE, ondansetron **OR** ondansetron (ZOFRAN) IV, pneumococcal 23 valent vaccine, sodium chloride flush, sodium chloride flush   Vital Signs    Vitals:   04/24/16 1044 04/24/16 1314 04/24/16 1959 04/25/16 0526  BP: (!) 122/91 114/71 131/89 125/81  Pulse: 82 78 78 80  Resp:  18 18 18   Temp:  98.3 F (36.8 C) 97.7 F (36.5 C) 97.1 F (36.2 C)  TempSrc:  Oral Oral Oral  SpO2:  100% 97% 100%  Weight:    197 lb 3.2 oz (89.4 kg)  Height:        Intake/Output Summary (Last 24 hours) at 04/25/16 0928 Last data filed at 04/24/16 1230  Gross per 24 hour  Intake              360 ml  Output                0 ml  Net              360 ml   Filed Weights   04/23/16 0421 04/24/16 0436 04/25/16 0526  Weight: 211 lb 10.3 oz (96 kg) 200 lb (90.7 kg) 197 lb 3.2 oz (89.4 kg)    Physical Exam    GEN: Well nourished, well  developed, in no acute distress.  HEENT: Grossly normal.  Neck: Supple Cardiac: RRR; no edema; radial cath site with no edema Respiratory:  CTA GI: Soft, nontender, nondistended. MS: no deformity or atrophy. Skin: warm and dry, no rash. Neuro:  Strength and sensation are intact.   Labs    CBC  Recent Labs  04/23/16 0457 04/24/16 0252  WBC 5.9 6.4  HGB 11.3* 11.6*  HCT 33.6* 34.0*  MCV 84.6 83.1  PLT 300 258   Basic Metabolic Panel  Recent Labs  04/24/16 0252 04/25/16 0403  NA 140 141  K 3.5 3.9  CL 107 109  CO2 24 25  GLUCOSE 137* 136*  BUN 10 10  CREATININE 0.94 0.99  CALCIUM 9.2 9.2    Telemetry    Sinus with PVCs and NSVT - Personally Reviewed   Radiology    No results found.    Patient Profile  45 year old male with past medical history of hypertension, diabetes mellitus, tobacco abuse admitted with congestive heart failure symptoms. Chest CT showed pleural effusions, coronary atherosclerosis  and large pulmonary arteries. BNP 915. Echocardiogram shows severe LV dysfunction with ejection fraction 25-30%. Cath shows recently occluded LAD, 70 Lcx and 90 PDA. EF 10-15; LVEDP 31.   Assessment & Plan    1 acute systolic congestive heart failure/cardiomyopathy-patient's presentation is most consistent with CHF. Echo shows EF 25-30 and grade 2 diastolic dysfunction. Cardiac catheterization shows recently occluded LAD. Likely ischemic cardiomyopathy although cardiomyopathy is felt out of proportion to coronary disease so may be mixed. Continue carvedilol 6.25 mg twice a day. Lisinopril DCed; add entresto 24/26 BID today. Continue lasix 20 mg daily and spironolactone 12.5 mg daily. Check BMET 12/26 when he follows with CHF clinic. Med titration in CHF clinic. Patient will need follow-up echocardiogram 3 months after medications titrated to see if LV function improved. If not, will need ICD. Life vest arranged for today; Pt can be DCed when life vest in place.  2  severe HTN-medication adjustment as outlined above. We will continue to titrate meds as tolerated.   3 question pneumonia-antibiotics DCed per primary care.  4 diabetes mellitus   5 CAD-Treat with ASA and statin.  6 abnormal chest xray-FU chest xray 4 weeks  Signed, Kirk Ruths, MD  04/25/2016, 9:28 AM

## 2016-04-25 NOTE — Discharge Summary (Signed)
Physician Discharge Summary  Christopher Roth GYI:[id number] DOB: 02-25-1971 DOA: 04/20/2016  PCP: Helane Rima, MD  Admit date: 04/20/2016 Discharge date: 04/25/2016  Admitted From: Home Disposition: Home  Recommendations for Outpatient Follow-up:  1. Follow up with Dr. [name] on on 04/29/2016 2. Please obtain BMP/CBC in one week.  Home Health: NA Equipment/Devices:NA  Discharge Condition: Stable CODE STATUS: Full Code Diet recommendation: Diet Heart Room service appropriate? Yes; Fluid consistency: Thin Diet - low sodium heart healthy  Brief/Interim Summary: 45 y.o.malewith history of diabetes mellitus type 2, hypertension presently off medications, tobacco abuse quit 2 weeks ago presented to the ER because of shortness of breath. Patient has been having productive coughfor last couple of months and has beenonat least on 3 courses of antibiotics. He finished a course recently. Patient was found to have multifocal pneumonia on CT scan. Bilateral pleural effusions were also noted. Patient was hospitalized for further management.  Discharge Diagnoses:  Principal Problem:   Acute systolic congestive heart failure (HCC) Active Problems:   Essential hypertension   Diabetes mellitus type II, uncontrolled ([place])   Community acquired pneumonia   Elevated troponin   Pneumonia   Congestive heart failure, unspecified   Acute respiratory failure with hypoxia. Initially etiology was thought to be due to multifocal pneumonia noted on CT scan.  This is however is likely secondary to acute pulmonary edema from acute CHF. This is resolved, currently on room air, was on supplemental oxygen on admission.  Acute combined systolic and diastolic CHF. EF is 25-30% and grade 2 diastolic dysfunction. This is a new diagnosis. 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 Cardiac catheterization done on 10/20 and showed total occlusion of the mid LAD and  other partial occlusion. Discharge on Coreg, Lasix and Enteresto. Patient fitted with LifeVest prior to discharge. Per cardiology, if LV function does not improve in 3 months will need ICD.  Multifocal pneumonia Opacities on the x-ray, read as multifocal pneumonia. Patient does not have fever or leukocytosis. CT shows bilateral effusion and patchy airspace disease suggesting ?pneumonia. Antibiotics discontinued, this is likely secondary to volume overload.  Accelerated hypertension Blood pressure was poorly controlled, initially was on lisinopril (last dose on 04/22/2016). Blood pressure controlled prior to discharge.  Chest tightness with minimal troponin elevation and abnormal EKG Denies any discomfort. Abnormal echocardiogram as noted above. Troponins noted to be minimally elevated. D-dimer was high, but CT scan which was done with contrast did not show any PE. Continue aspirin.   Diabetes mellitus type 2. Last HbA1c was 9. Recently started on glipizide. Also on metformin, which is being held. Sliding scale insulin coverage.  History of tobacco abuse. Apparently, patient quit 2 weeks ago. Continue to emphasize cessation.  Bilateral pulmonary nodules. Detected on CT scan. Will need close outpatient monitoring.  Dyslipidemia Total cholesterol 193, TGL 80, HDL 52 and LDL is 125, started on Lipitor 80 mg.   Consultations:  Rounding Lbcardiology, MD   Procedures Echocardiogram done on 04/21/2016 Study Conclusions - Left ventricle: The cavity size was normal. There was mild concentric hypertrophy. Systolic function was severely reduced. The estimated ejection fraction was in the range of 25% to 30%. There is akinesis of the entireapical myocardium. There is akinesis of the entireanteroseptal, inferior, and inferoseptal myocardium. There is severe hypokinesis of the anterior myocardium. Features are consistent with a pseudonormal left ventricular filling pattern, with  concomitant abnormal relaxation and increased filling pressure (grade 2 diastolic dysfunction). Doppler parameters are consistent with high ventricular filling pressure. -  Left atrium: The atrium was mildly dilated. - Pulmonary arteries: Systolic pressure could not be accurately estimated. - Pericardium, extracardiac: A trivial, free-flowing pericardial effusion was identified along the right atrial free wall. The fluid had no internal echoes.  Cardiac catheterization done on 04/23/2016 Conclusion    Recent total occlusion of the proximal to mid LAD.  70% ramus intermedius, 65% ostial circumflex followed by 70% mid circumflex, 75% proximal LAD prior to the origin of a small to moderate size diagonal, and 90% mid PDA.  Mild pulmonary hypertension with pulmonary capillary wedge of 14 mmHg.  Severe LV dysfunction with EF 10-15%. Marked elevation of left ventricular end-diastolic pressure. LV dysfunction is out of proportion to the degree of coronary disease. Consider superimposed myocarditis related to the recent upper respiratory illness.  RECOMMENDATIONS:   Advanced heart failure team consultation  LifeVest prior to discharge.  Guideline mandated heart failure therapy  Guarded prognosis.    Discharge Instructions  Discharge Instructions    Diet - low sodium heart healthy    Complete by:  As directed    Increase activity slowly    Complete by:  As directed      Allergies as of 04/25/2016      Reactions   Januvia [sitagliptin] Hives      Medication List    STOP taking these medications   guaiFENesin-codeine 100-10 MG/5ML syrup     TAKE these medications   albuterol 108 (90 Base) MCG/ACT inhaler Commonly known as:  PROVENTIL HFA;VENTOLIN HFA Inhale 2 puffs into the lungs every 6 (six) hours as needed for wheezing or shortness of breath.   Alcohol Swabs Pads 1 Package by Does not apply route as needed (with blood glucose checks).   aspirin 325 MG tablet Take 1  tablet (325 mg total) by mouth daily. Start taking on:  04/26/2016   atorvastatin 80 MG tablet Commonly known as:  LIPITOR Take 1 tablet (80 mg total) by mouth daily at 6 PM.   carvedilol 6.25 MG tablet Commonly known as:  COREG Take 1 tablet (6.25 mg total) by mouth 2 (two) times daily with a meal.   freestyle lancets Use as instructed   furosemide 20 MG tablet Commonly known as:  LASIX Take 1 tablet (20 mg total) by mouth daily. Start taking on:  04/26/2016   glipiZIDE 10 MG 24 hr tablet Commonly known as:  GLUCOTROL XL Take 10 mg by mouth daily with breakfast.   glucose blood test strip Commonly known as:  CHOICE DM FORA G20 TEST STRIPS Use as instructed   glucose monitoring kit monitoring kit 1 each by Does not apply route as needed for other.   metFORMIN 500 MG 24 hr tablet Commonly known as:  GLUCOPHAGE-XR Take 1,000 mg by mouth 2 (two) times daily.   sacubitril-valsartan 24-26 MG Commonly known as:  ENTRESTO Take 1 tablet by mouth 2 (two) times daily.       Allergies  Allergen Reactions  . Januvia [Sitagliptin] Hives     Radiological studies: Dg Chest 2 View  Result Date: 04/22/2016 CLINICAL DATA:  Follow-up recent episode of pneumonia. The patient has completed antibiotics. Patient reports only mild lingering cough. EXAM: CHEST  2 VIEW COMPARISON:  CT scan of the chest of April 20, 2016 and chest x-ray of the same day. FINDINGS: The lungs are adequately inflated. There is patchy increased density in the left lower lobe. There is a small left pleural effusion and smaller right pleural effusion. There has been improvement  in the appearance of the pulmonary interstitium at the right lung base. The heart is normal in size. The pulmonary vascularity is not engorged. The mediastinum is normal in width. The bony thorax is unremarkable. IMPRESSION: Persistent left lower lobe infiltrate. Persistent small bilateral pleural effusions greatest on the left. Given that  the patient has clinically improved, a follow-up PA and lateral chest X-ray is recommended in 3-4 weeks to ensure complete resolution and exclude underlying malignancy. Electronically Signed   By: David  Martinique M.D.   On: 04/22/2016 13:15   Dg Chest 2 View  Result Date: 04/20/2016 CLINICAL DATA:  Pt has had SOB and left sided chest pain for the last week. Pt also c/o dry cough. Pt has a hx of HTN, diabetes and is a recent former smoker. EXAM: CHEST  2 VIEW COMPARISON:  None. FINDINGS: Heart size is mildly enlarged. There are patchy infiltrates involving the lower lobes, lingula, and right middle lobe. Small bilateral pleural effusions are suspected. No evidence for pulmonary edema. IMPRESSION: Multifocal infiltrates. Electronically Signed   By: Nolon Nations M.D.   On: 04/20/2016 20:13   Ct Chest W Contrast  Result Date: 04/21/2016 CLINICAL DATA:  Shortness of breath and cough for 1 week. Mild left-sided chest pain. EXAM: CT CHEST WITH CONTRAST TECHNIQUE: Multidetector CT imaging of the chest was performed during intravenous contrast administration. CONTRAST:  68m ISOVUE-300 IOPAMIDOL (ISOVUE-300) INJECTION 61% COMPARISON:  Chest radiograph 04/20/2016 FINDINGS: Cardiovascular: There is coronary artery atherosclerosis. Heart size is normal without pericardial effusion. The main pulmonary artery measures 3.6 cm at the bifurcation. The thoracic aorta is normal. Proximal arch vessels are patent. Mediastinum/Nodes: Pretracheal lymph nodes measure up to 1.5 cm. The mediastinum is otherwise unremarkable. Lungs/Pleura: There are medium-sized bilateral pleural effusions. There are multifocal patchy opacities within both lungs, slightly worse on the left. There is multifocal subsegmental atelectasis nodule in the right upper lobe measures 5 mm. Left upper lobe nodule measures 7 mm. No masses are visualized. No focal hilar abnormality. Central airways are patent. Upper Abdomen: No acute abnormality.  Musculoskeletal: No chest wall abnormality. No acute or significant osseous findings. IMPRESSION: 1. Medium-sized bilateral pleural effusions and multifocal patchy airspace opacities, suggesting multifocal pneumonia. 2. No discrete pulmonary masses. Small bilateral upper lobe pulmonary nodules, measuring up to 7 mm. These are favored to be part of the same inflammatory/infectious process. However, non-contrast chest CT at 3-6 months is recommended. If the nodules are stable at time of repeat CT, then future CT at 18-24 months (from today's scan) is considered optional for low-risk patients, but is recommended for high-risk patients. This recommendation follows the consensus statement: Guidelines for Management of Incidental Pulmonary Nodules Detected on CT Images: From the Fleischner Society 2017; Radiology 2017; 284:228-243. 3. Enlarged main pulmonary artery may indicate underlying pulmonary hypertension. 4. Coronary artery atherosclerosis. Electronically Signed   By: KUlyses JarredM.D.   On: 04/21/2016 00:11     Subjective:  Discharge Exam: Vitals:   04/24/16 1044 04/24/16 1314 04/24/16 1959 04/25/16 0526  BP: (!) 122/91 114/71 131/89 125/81  Pulse: 82 78 78 80  Resp:  18 18 18   Temp:  98.3 F (36.8 C) 97.7 F (36.5 C) 97.1 F (36.2 C)  TempSrc:  Oral Oral Oral  SpO2:  100% 97% 100%  Weight:    89.4 kg (197 lb 3.2 oz)  Height:       General: Pt is alert, awake, not in acute distress Cardiovascular: RRR, S1/S2 +, no rubs, no gallops  Respiratory: CTA bilaterally, no wheezing, no rhonchi Abdominal: Soft, NT, ND, bowel sounds + Extremities: no edema, no cyanosis   The results of significant diagnostics from this hospitalization (including imaging, microbiology, ancillary and laboratory) are listed below for reference.    Microbiology: No results found for this or any previous visit (from the past 240 hour(s)).   Labs: BNP (last 3 results)  Recent Labs  04/21/16 0456  BNP 915.6*    Basic Metabolic Panel:  Recent Labs Lab 04/21/16 0457 04/22/16 0454 04/23/16 0457 04/24/16 0252 04/25/16 0403  NA 138 140 140 140 141  K 3.6 3.3* 3.7 3.5 3.9  CL 105 105 108 107 109  CO2 26 25 24 24 25   GLUCOSE 164* 126* 136* 137* 136*  BUN 7 8 11 10 10   CREATININE 0.92 0.97 0.94 0.94 0.99  CALCIUM 9.4 9.0 9.2 9.2 9.2   Liver Function Tests:  Recent Labs Lab 04/21/16 0457  AST 17  ALT 23  ALKPHOS 70  BILITOT 1.1  PROT 6.7  ALBUMIN 3.8   No results for input(s): LIPASE, AMYLASE in the last 168 hours. No results for input(s): AMMONIA in the last 168 hours. CBC:  Recent Labs Lab 04/20/16 1906 04/21/16 0457 04/22/16 0454 04/23/16 0457 04/24/16 0252  WBC 9.4 9.3 6.6 5.9 6.4  NEUTROABS  --  6.4  --   --   --   HGB 12.6* 12.9* 11.8* 11.3* 11.6*  HCT 36.2* 37.8* 34.3* 33.6* 34.0*  MCV 84.0 83.4 82.7 84.6 83.1  PLT 442* 401* 351 300 337   Cardiac Enzymes:  Recent Labs Lab 04/21/16 0457 04/21/16 1017 04/21/16 1556  TROPONINI 0.15* 0.14* 0.14*   BNP: Invalid input(s): POCBNP CBG:  Recent Labs Lab 04/24/16 0611 04/24/16 1142 04/24/16 1611 04/24/16 2046 04/25/16 0612  GLUCAP 133* 174* 213* 113* 144*   D-Dimer No results for input(s): DDIMER in the last 72 hours. Hgb A1c No results for input(s): HGBA1C in the last 72 hours. Lipid Profile No results for input(s): CHOL, HDL, LDLCALC, TRIG, CHOLHDL, LDLDIRECT in the last 72 hours. Thyroid function studies No results for input(s): TSH, T4TOTAL, T3FREE, THYROIDAB in the last 72 hours.  Invalid input(s): FREET3 Anemia work up No results for input(s): VITAMINB12, FOLATE, FERRITIN, TIBC, IRON, RETICCTPCT in the last 72 hours. Urinalysis    Component Value Date/Time   COLORURINE LT YELLOW 08/04/2007 1447   APPEARANCEUR Clear 08/04/2007 1447   LABSPEC 1.010 08/04/2007 1447   PHURINE 5.5 08/04/2007 1447   GLUCOSEU > or = 1000 mg/dL (AA) 08/04/2007 1447   BILIRUBINUR NEGATIVE 08/04/2007 1447    KETONESUR NEGATIVE 08/04/2007 1447   UROBILINOGEN 0.2 mg/dL 08/04/2007 1447   NITRITE Negative 08/04/2007 1447   LEUKOCYTESUR Negative 08/04/2007 1447   Sepsis Labs Invalid input(s): PROCALCITONIN,  WBC,  LACTICIDVEN Microbiology No results found for this or any previous visit (from the past 240 hour(s)).   Time coordinating discharge: Over 30 minutes  SIGNED:   Birdie Hopes, MD  [facility] 04/25/2016, 11:00 AM Pager   If 7PM-7AM, please contact night-coverage [email or web address] Password [email or web address]

## 2016-04-25 NOTE — Progress Notes (Signed)
Discharged to home with family office visits in place teaching done  

## 2016-04-25 NOTE — Care Management Note (Signed)
Case Management Note Marvetta Gibbons RN, BSN Unit 2W-Case Manager [phone]  Patient Details  Name: Christopher Roth MRN: [id number] Date of Birth: 05/31/1970  Subjective/Objective:  Pt admitted with acute HF                  Action/Plan: PTA pt lived at home with wife- received call regarding need for LifeVest at discharge from [place] rep-Jerold. Spoke with Dr. Stanford Breed regarding need for order form to be signed- have left on [facility] chart for signature-  Once signed will fax order form and all needed paperwork to [employer] to start process for insurance auth for LifeVest.   Expected Discharge Date: 04/25/16              Expected Discharge Plan:  Home/Self Care  In-House Referral:     Discharge planning Services  CM Consult  Post Acute Care Choice:  Durable Medical Equipment Choice offered to:  Patient  DME Arranged:  Vest life vest DME Agency:  Other - Comment  HH Arranged:  NA HH Agency:     Status of Service:  Completed, signed off  If discussed at [place] of Stay Meetings, dates discussed:    Discharge Disposition: home/self care   Additional Comments:  04/25/16- 1100- Christopher Cedeno RN, CM- pt to d/c home today once fitted with LifeVest.   04/24/16- 1600- Christopher Vandervoort RN, CM- spoke with [employer] rep- they are working on [employer] fast streamer for [employer]- spoke with Dr. Hartford Poli- who states plan will be to d/c pt tomorrow once Life Vest in place.   Dawayne Patricia, RN 04/25/2016, 11:14 AM

## 2016-04-29 ENCOUNTER — Encounter (HOSPITAL_COMMUNITY): Payer: Self-pay | Admitting: *Deleted

## 2016-04-29 ENCOUNTER — Ambulatory Visit (HOSPITAL_COMMUNITY)
Admission: RE | Admit: 2016-04-29 | Discharge: 2016-04-29 | Disposition: A | Discharge status: Home / Self Care | Payer: 59 | Source: Ambulatory Visit | Attending: Internal Medicine | Admitting: Internal Medicine

## 2016-04-29 VITALS — BP 108/80 | HR 70 | Wt 204.5 lb

## 2016-04-29 DIAGNOSIS — E1159 Type 2 diabetes mellitus with other circulatory complications: Secondary | ICD-10-CM

## 2016-04-29 DIAGNOSIS — I10 Essential (primary) hypertension: Secondary | ICD-10-CM

## 2016-04-29 DIAGNOSIS — F17200 Nicotine dependence, unspecified, uncomplicated: Secondary | ICD-10-CM | POA: Diagnosis not present

## 2016-04-29 DIAGNOSIS — E1165 Type 2 diabetes mellitus with hyperglycemia: Secondary | ICD-10-CM

## 2016-04-29 DIAGNOSIS — IMO0002 Reserved for concepts with insufficient information to code with codable children: Secondary | ICD-10-CM

## 2016-04-29 DIAGNOSIS — I2510 Atherosclerotic heart disease of native coronary artery without angina pectoris: Secondary | ICD-10-CM

## 2016-04-29 DIAGNOSIS — I5020 Unspecified systolic (congestive) heart failure: Secondary | ICD-10-CM

## 2016-04-29 MED ORDER — SACUBITRIL-VALSARTAN 24-26 MG PO TABS: 1.0000 | ORAL_TABLET | Freq: Two times a day (BID) | ORAL | 5 refills | Status: DC

## 2016-04-29 NOTE — Patient Instructions (Signed)
Start Entresto 24/26 mg Twice daily   Labs in 1 week  Your physician recommends that you schedule a follow-up appointment in: 2-3 weeks and in 6 weeks with Abbe Amsterdam D  Your physician recommends that you schedule a follow-up appointment in: 2 months with Dr [name]

## 2016-04-29 NOTE — Progress Notes (Signed)
Advanced Heart Failure Clinic Note   Referring Physician: Crenshaw Primary Care: [name], MD at [facility] family Primary Cardiologist: Dr Stanford Breed (New) HF: Dr. [name] ([place])  HPI:  Christopher Roth is a 45 y.o. male with PMH of HTN, DM2, Tobacco abuse, CAD, and systolic CHF.   Admitted 04/20/16 -> 04/25/16 with new onest HF symptoms. Echo 04/21/16 LVEF 25-30%, Grade 2 DD. Diuresed 12 lbs. LHC with significant CAD as below.   He presents today to establish with the HF clinic. Overall feeling OK. Weight at home 198 - 200. Was closer to 206 PTA. Occasional cough. Denies SOB currently. No DOE. Denies orthopnea or PND. Walked all the way from far parking garage to clinic without difficulty. Denies CP. States he has had occasionally chest pain in the past, but never exertional. Denies edema. Taking lasix 20 mg daily. Denies lightheadedness or dizziness. Stopped smoking 1 month ago.  Was smoking up to 1 ppd for ~25 years. Drinks a 12 pack on the weekend during football season, Says about 3-4 on the weekend. Had non alcoholic beer. No drug use of history. Was unable to get Entresto due to requirement for PA. 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  Social: Lives in [place], Wife and Son  Family history: Brother, 61, has pacemaker -> strokes and vascular disease Mother: HTN, Diabetes Father: Diabetes  LHC 04/23/16 - Recent total occlusion of the proximal to mid LAD.  - 70% ramus intermedius, 65% ostial circumflex followed by 70% mid circumflex, 75% proximal LAD prior to the origin of a small to moderate size diagonal, and 90% mid PDA. - Mild PAH, PCWP 14 - LVEF 10-15% Hemodynamics RHC Procedural Findings: Hemodynamics (mmHg) RA mean 4 RV 39/1 PA 37/12 PCWP 14 AO 115/78 Cardiac Output (Fick) 5.63 Cardiac Index (Fick) 2.71  Review of Systems: [y] = yes, [ ]  = no   General: Weight gain [ ] ; Weight loss [ ] ; Anorexia [ ] ; Fatigue [ ] ; Fever [ ] ; Chills [ ] ;  Weakness [ ]   Cardiac: Chest pain/pressure [ ] ; Resting SOB [ ] ; Exertional SOB [ ] ; Orthopnea [ ] ; Pedal Edema [ ] ; Palpitations [ ] ; Syncope [ ] ; Presyncope [ ] ; Paroxysmal nocturnal dyspnea[ ]   Pulmonary: Cough [y]; Wheezing[ ] ; Hemoptysis[ ] ; Sputum [ ] ; Snoring [ ]   GI: Vomiting[ ] ; Dysphagia[ ] ; Melena[ ] ; Hematochezia [ ] ; Heartburn[ ] ; Abdominal pain [ ] ; Constipation [ ] ; Diarrhea [ ] ; BRBPR [ ]   GU: Hematuria[ ] ; Dysuria [ ] ; Nocturia[ ]   Vascular: Pain in legs with walking [ ] ; Pain in feet with lying flat [ ] ; Non-healing sores [ ] ; Stroke [ ] ; TIA [ ] ; Slurred speech [ ] ;  Neuro: Headaches[ ] ; Vertigo[ ] ; Seizures[ ] ; Paresthesias[ ] ;Blurred vision [ ] ; Diplopia [ ] ; Vision changes [ ]   Ortho/Skin: Arthritis [ ] ; Joint pain [ ] ; Muscle pain [ ] ; Joint swelling [ ] ; Back Pain [ ] ; Rash [ ]   Psych: Depression[ ] ; Anxiety[ ]   Heme: Bleeding problems [ ] ; Clotting disorders [ ] ; Anemia [ ]   Endocrine: Diabetes [y]; Thyroid dysfunction[ ]    Past Medical History:  Diagnosis Date  . Acute systolic congestive heart failure ([place]) 04/22/2016  . Diabetes mellitus without complication ([place])   . Hypertension   . Tobacco use disorder 11/21/2014    Current Outpatient Prescriptions  Medication Sig Dispense Refill  . albuterol (PROVENTIL HFA;VENTOLIN HFA) 108 (90 Base) MCG/ACT inhaler Inhale 2 puffs into the lungs every 6 (six) hours  as needed for wheezing or shortness of breath.    . Alcohol Swabs PADS 1 Package by Does not apply route as needed (with blood glucose checks). 1 each 0  . aspirin 325 MG tablet Take 1 tablet (325 mg total) by mouth daily. 30 tablet 0  . atorvastatin (LIPITOR) 80 MG tablet Take 1 tablet (80 mg total) by mouth daily at 6 PM. 30 tablet 0  . carvedilol (COREG) 6.25 MG tablet Take 1 tablet (6.25 mg total) by mouth 2 (two) times daily with a meal. 60 tablet 0  . furosemide (LASIX) 20 MG tablet Take 1 tablet (20 mg total) by mouth daily. 30 tablet 0  . glipiZIDE  (GLUCOTROL XL) 10 MG 24 hr tablet Take 10 mg by mouth daily with breakfast.    . glucose blood (CHOICE DM FORA G20 TEST STRIPS) test strip Use as instructed 100 each 11  . glucose monitoring kit (FREESTYLE) monitoring kit 1 each by Does not apply route as needed for other. 1 each 0  . Lancets (FREESTYLE) lancets Use as instructed 100 each 11  . metFORMIN (GLUCOPHAGE-XR) 500 MG 24 hr tablet Take 1,000 mg by mouth 2 (two) times daily.    . sacubitril-valsartan (ENTRESTO) 24-26 MG Take 1 tablet by mouth 2 (two) times daily. (Patient not taking: Reported on 04/29/2016) 60 tablet 0   No current facility-administered medications for this encounter.     Allergies  Allergen Reactions  . Januvia [Sitagliptin] Hives     Social History   Social History  . Marital status: Married    Spouse name: N/A  . Number of children: N/A  . Years of education: N/A   Occupational History  . Not on file.   Social History Main Topics  . Smoking status: Current Every Day Smoker    Packs/day: 1.00    Years: 24.00    Types: Cigarettes  . Smokeless tobacco: Never Used  . Alcohol use Yes  . Drug use: No  . Sexual activity: Not on file   Other Topics Concern  . Not on file   Social History Narrative  . No narrative on file     Family History  Problem Relation Age of Onset  . Diabetes Mellitus II Mother     Vitals:   04/29/16 0938  BP: 108/80  Pulse: 70  SpO2: 100%  Weight: 204 lb 8 oz (92.8 kg)    PHYSICAL EXAM: General:  Well appearing. NAD HEENT: Normal  Neck: Supple. JVD does not appear elevated. Carotids 2+ bilat; no bruits. No lymphadenopathy or thyromegaly appreciated. Cor: PMI laterally displaced. RRR. No M/G/R noted. LifeVest in place. Lungs: CTAB, normal effort Abdomen: Soft, NT, ND, no HSM. No bruits or masses. +BS  Extremities: No cyanosis, clubbing, rash. No peripheral edema.  Neuro: Alert & oriented x 3, cranial nerves grossly intact. moves all 4 extremities w/o  difficulty. Affect pleasant.  ASSESSMENT & PLAN:  1. Chronic systolic HF : Echo 71/1657 LVEF 25-30%, Grade 2 DD (EF 10-15% by cath) - Likely due to CAD below.  - Volume status stable on exam. NYHA II symptoms. Continue lasix 20 mg daily. Can take extra as needed - Continue coreg 6.25 mg BID.  - Will start Entresto 24/26 mg BID.  - Should consider spiro in the future. - Lifevest in place. Continue for at least 3 months prior to repeat Echo with optimization of medical therapy. Will need ICD if EF does not recover.  - Could consider cMRI in the  future to look for scarring/viability. - Consider CPX if functional status changes. - Reinforced fluid restriction to < 2 L daily, sodium restriction to less than 2000 mg daily, and the importance of daily weights.  2. CAD via [place] 04/2016 - Continue ASA and statin - LDL 125 on 04/22/16. 3. HTN - Controlled on current regimen. 4. DMII - Per PCP 5. Tobacco abuse - Stopped 1 month ago. - Has ~25 pack years.   Pt works as a Camera operator. Needs to consider alternative means/light duty, at least for now.   Shirley Friar, PA-C 04/29/16   Patient seen and examined with Oda Kilts, PA-C. We discussed all aspects of the encounter. I agree with the assessment and plan as stated above.   Overall doing very well post-hospital. Long discussion about etiology of cardiomyopathy. Symptoms to look for and goals of treatment. Agree with starting Entresto. Will need cMRI. Continue LifeVest for now. May need CPX soon.  Given EF < 40%. Will not be eligible for CDL. Will need close f/u in HF Clinic.   Total time spent 40 minutes. Over half that time spent discussing above.   Rocko Fesperman,MD 11:12 PM

## 2016-05-06 ENCOUNTER — Telehealth (HOSPITAL_COMMUNITY): Payer: Self-pay | Admitting: Surgery

## 2016-05-06 ENCOUNTER — Ambulatory Visit (HOSPITAL_COMMUNITY)
Admission: RE | Admit: 2016-05-06 | Discharge: 2016-05-06 | Disposition: A | Discharge status: Home / Self Care | Payer: 59 | Source: Ambulatory Visit | Attending: Cardiology | Admitting: Cardiology

## 2016-05-06 DIAGNOSIS — I5020 Unspecified systolic (congestive) heart failure: Secondary | ICD-10-CM | POA: Diagnosis not present

## 2016-05-06 LAB — BASIC METABOLIC PANEL
Anion gap: 7 (ref 5–15)
BUN: 19 mg/dL (ref 6–20)
CALCIUM: 9.6 mg/dL (ref 8.9–10.3)
CO2: 22 mmol/L (ref 22–32)
Chloride: 107 mmol/L (ref 101–111)
Creatinine, Ser: 1.26 mg/dL — ABNORMAL HIGH (ref 0.61–1.24)
GFR calc Af Amer: >60 mL/min (ref >60)
GLUCOSE: 175 mg/dL — AB (ref 65–99)
Potassium: 5.1 mmol/L (ref 3.5–5.1)
Sodium: 136 mmol/L (ref 135–145)

## 2016-05-06 LAB — BRAIN NATRIURETIC PEPTIDE: B Natriuretic Peptide: 121.4 pg/mL — ABNORMAL HIGH (ref 0.0–100.0)

## 2016-05-06 NOTE — Telephone Encounter (Signed)
Christopher Roth called to inquire about if it is ok to drink [place] non-alcoholic beer.  I informed him to avoid alcohol and to limit all fluids to less than 2 L per day.  He tells me that his weight this AM is 194 lbs and denies SOB.  He also has some concerns about a "bad taste" in his mouth and is wondering if that is related to new medications.  I have asked him to bring up this question when he sees Doroteo Bradford (Pharm D) on 05/14/16.  He is agreeable.  I encouraged him to call me back with any concerns or questions related to his HF.

## 2016-05-08 ENCOUNTER — Encounter (HOSPITAL_COMMUNITY): Payer: Self-pay

## 2016-05-08 NOTE — Progress Notes (Signed)
Disability claim form completed and placed in Dr. Clayborne Dana folder for review and signature. Will make copy of form once completed and scan into patient's electronic medical record. Will also call patient and fax forms as requested.  Renee Pain, RN

## 2016-05-13 ENCOUNTER — Telehealth (HOSPITAL_COMMUNITY): Payer: Self-pay | Admitting: *Deleted

## 2016-05-13 NOTE — Telephone Encounter (Signed)
Disability Claim form filled out and faxed to [facility] @ [phone].

## 2016-05-14 ENCOUNTER — Ambulatory Visit (HOSPITAL_COMMUNITY)
Admission: RE | Admit: 2016-05-14 | Discharge: 2016-05-14 | Disposition: A | Discharge status: Home / Self Care | Payer: 59 | Source: Ambulatory Visit | Attending: Cardiology | Admitting: Cardiology

## 2016-05-14 VITALS — BP 132/82 | HR 75 | Wt 212.0 lb

## 2016-05-14 DIAGNOSIS — E119 Type 2 diabetes mellitus without complications: Secondary | ICD-10-CM | POA: Insufficient documentation

## 2016-05-14 DIAGNOSIS — I110 Hypertensive heart disease with heart failure: Secondary | ICD-10-CM | POA: Insufficient documentation

## 2016-05-14 DIAGNOSIS — Z87891 Personal history of nicotine dependence: Secondary | ICD-10-CM | POA: Insufficient documentation

## 2016-05-14 DIAGNOSIS — I5020 Unspecified systolic (congestive) heart failure: Secondary | ICD-10-CM

## 2016-05-14 DIAGNOSIS — I5022 Chronic systolic (congestive) heart failure: Secondary | ICD-10-CM | POA: Insufficient documentation

## 2016-05-14 DIAGNOSIS — I2510 Atherosclerotic heart disease of native coronary artery without angina pectoris: Secondary | ICD-10-CM | POA: Insufficient documentation

## 2016-05-14 DIAGNOSIS — Z79899 Other long term (current) drug therapy: Secondary | ICD-10-CM | POA: Insufficient documentation

## 2016-05-14 LAB — BASIC METABOLIC PANEL
ANION GAP: 3 — AB (ref 5–15)
BUN: 9 mg/dL (ref 6–20)
CALCIUM: 9.6 mg/dL (ref 8.9–10.3)
CO2: 26 mmol/L (ref 22–32)
Chloride: 110 mmol/L (ref 101–111)
Creatinine, Ser: 1.01 mg/dL (ref 0.61–1.24)
Glucose, Bld: 218 mg/dL — ABNORMAL HIGH (ref 65–99)
Potassium: 4.6 mmol/L (ref 3.5–5.1)
Sodium: 139 mmol/L (ref 135–145)

## 2016-05-14 MED ORDER — FUROSEMIDE 20 MG PO TABS: 20.0000 mg | ORAL_TABLET | Freq: Every day | ORAL | 11 refills | Status: DC

## 2016-05-14 MED ORDER — CARVEDILOL 6.25 MG PO TABS: 6.2500 mg | ORAL_TABLET | Freq: Two times a day (BID) | ORAL | 5 refills | Status: DC

## 2016-05-14 MED ORDER — SPIRONOLACTONE 25 MG PO TABS: 12.5000 mg | ORAL_TABLET | Freq: Every day | ORAL | 5 refills | Status: DC

## 2016-05-14 MED ORDER — CARVEDILOL 12.5 MG PO TABS: 12.5000 mg | ORAL_TABLET | Freq: Two times a day (BID) | ORAL | 5 refills | Status: DC

## 2016-05-14 NOTE — Patient Instructions (Addendum)
It was great to see you today!  Please START spironolactone 12.5 mg (1/2 tablet) DAILY.  Labs in 1 week.   You are scheduled to see Doroteo Bradford, PharmD on 06/04/16.

## 2016-05-14 NOTE — Progress Notes (Signed)
HPI:  Christopher Roth is a 46 y.o. AA male with PMH of HTN, DM2, Tobacco abuse, CAD, and systolic CHF.   Admitted 04/20/16 -> 04/25/16 with new onest HF symptoms. Echo 04/21/16 LVEF 25-30%, Grade 2 DD. Diuresed 12 lbs. LHC with significant CAD as below.   He presents today for pharmacist-led HF medication titration. At his last HF clinic visit on 04/29/16, Entresto 24-26 mg BID was initiated but not started until 05/06/16. Overall feeling well. Weight at home 198 - 200. Was closer to 206 PTA. Washed car yesterday with no issues. Stopped smoking over 1 month ago.  Was smoking up to 1 ppd for ~25 years. Has non alcoholic beer a few days a week. Staying away from alcohol. No drug use of history. 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    Marland Kitchen Shortness of breath/dyspnea on exertion? no  . Orthopnea/PND? no . Edema? no . Lightheadedness/dizziness? no . Daily weights at home? Yes - ~200 lb  . Blood pressure/heart rate monitoring at home? Yes - SBP 110/70 mmHg  . Following low-sodium/fluid-restricted diet? Yes - admits to "cheating" sometimes, but no salt at home and wife checks nutritional labels for sodium content; does not drink > 2 L fluid/day  HF Medications: Carvedilol 6.25 mg PO BID Furosemide 20 mg PO daily Entresto 24-26 mg PO BID  Has the patient been experiencing any side effects to the medications prescribed?  no  Does the patient have any problems obtaining medications due to transportation or finances?   no  Understanding of regimen: good Understanding of indications: good Potential of compliance: good Patient understands to avoid NSAIDs. Patient understands to avoid decongestants.    Pertinent Lab Values: . 05/14/16 :Serum creatinine 1.01 (BL ~1), BUN 9, Potassium 4.6, Sodium 139  Vital Signs: . Weight: 212 lb (dry weight: 202 lb) . Blood pressure: 132/82 mmHg  . Heart rate: 75 bpm    Assessment: 1.  Chronicsystolic CHF (EF 96-04%), due to ICM. NYHA class IIsymptoms.   - Volume status stable on exam despite weight up today in clinic (wearing large sweatshirt and boots), at home weight stable 199-202 lb  - Continue lasix 20 mg daily. Can take extra as needed for lower extremity edema or weight gain > 3lbs in 1 day or >5lb in 1 week   - Continue coreg 6.25 mg BID, Entresto 24-26 mg BID  - Will start spironolactone 12.5 mg daily  - Lifevest in place. Continue for at least 3 months prior to repeat Echo with optimization of medical therapy. Will need ICD if EF does not recover   - Could consider cMRI in the future to look for scarring/viability  - Consider CPX if functional status changes - Basic disease state pathophysiology, medication indication, mechanism and side effects reviewed at length with patient and he verbalized understanding 2. CAD via [place] 04/2016 - Continue ASA and statin - LDL 125 on 04/22/16 3. HTN - Slightly above goal of < 130/80 mmHg  - Initiate spironolactone as above 4. DMII - Per PCP 5. Tobacco abuse - Stopped over 1 month ago - Has ~25 pack years  Plan: 1) Medication changes: Based on clinical presentation, vital signs and recent labs will initiate spironolactone 12.5 mg daily 2) Labs: BMET today and in 1 week 3) Follow-up: 06/04/16 and with Dr. [name] on 06/26/16   Ruta Hinds. Velva Harman, PharmD, BCPS, CPP Clinical Pharmacist Pager: [phone] Phone: [phone] 05/14/2016 2:02  PM  Agree with initiation of spironolactone.   Jeanmarie Mccowen,MD 10:50 AM

## 2016-05-16 ENCOUNTER — Telehealth (HOSPITAL_COMMUNITY): Payer: Self-pay | Admitting: Pharmacist

## 2016-05-16 NOTE — Telephone Encounter (Signed)
Entresto PA approved by [employer] through 04/25/17.   Ruta Hinds. Velva Harman, PharmD, BCPS, CPP Clinical Pharmacist Pager: [phone] Phone: [phone] 05/16/2016 4:22 PM

## 2016-05-19 ENCOUNTER — Telehealth (HOSPITAL_COMMUNITY): Payer: Self-pay | Admitting: *Deleted

## 2016-05-19 NOTE — Telephone Encounter (Signed)
Patient called in stating his weight was up 3lbs overnight but hes not experiencing any new symptoms.  Wants to know if he should take an extra fluid pill.  I told patienti would talk to [facility] and call him back.  Message sent to [place]

## 2016-05-19 NOTE — Telephone Encounter (Signed)
Yes would take 1 extra fluid pill if weight still up. thanks

## 2016-05-20 ENCOUNTER — Telehealth (HOSPITAL_COMMUNITY): Payer: Self-pay | Admitting: *Deleted

## 2016-05-20 NOTE — Telephone Encounter (Signed)
Called to reschedule labs for tomorrow to Friday.  Patient is agreeable. Appointment changed.

## 2016-05-20 NOTE — Telephone Encounter (Signed)
Per Dr.Bensimhon if patients weight is still up have him take an extra lasix. Patient aware and agreeable.

## 2016-05-21 ENCOUNTER — Other Ambulatory Visit (HOSPITAL_COMMUNITY): Payer: 59

## 2016-05-23 ENCOUNTER — Encounter (HOSPITAL_COMMUNITY): Payer: Self-pay

## 2016-05-23 ENCOUNTER — Ambulatory Visit (HOSPITAL_COMMUNITY)
Admission: RE | Admit: 2016-05-23 | Discharge: 2016-05-23 | Disposition: A | Discharge status: Home / Self Care | Payer: 59 | Source: Ambulatory Visit | Attending: Cardiology | Admitting: Cardiology

## 2016-05-23 DIAGNOSIS — I5020 Unspecified systolic (congestive) heart failure: Secondary | ICD-10-CM | POA: Diagnosis not present

## 2016-05-23 LAB — BASIC METABOLIC PANEL
Anion gap: 8 (ref 5–15)
BUN: 9 mg/dL (ref 6–20)
CHLORIDE: 107 mmol/L (ref 101–111)
CO2: 26 mmol/L (ref 22–32)
CREATININE: 1.01 mg/dL (ref 0.61–1.24)
Calcium: 9.5 mg/dL (ref 8.9–10.3)
GFR calc non Af Amer: >60 mL/min (ref >60)
Glucose, Bld: 242 mg/dL — ABNORMAL HIGH (ref 65–99)
Potassium: 4.4 mmol/L (ref 3.5–5.1)
SODIUM: 141 mmol/L (ref 135–145)

## 2016-05-26 ENCOUNTER — Other Ambulatory Visit (HOSPITAL_COMMUNITY): Payer: Self-pay | Admitting: Pharmacist

## 2016-05-26 ENCOUNTER — Other Ambulatory Visit (HOSPITAL_COMMUNITY): Payer: Self-pay | Admitting: *Deleted

## 2016-05-26 MED ORDER — ATORVASTATIN CALCIUM 80 MG PO TABS: 80.0000 mg | ORAL_TABLET | Freq: Every day | ORAL | 5 refills | Status: DC

## 2016-05-26 MED ORDER — ATORVASTATIN CALCIUM 80 MG PO TABS
80.0000 mg | ORAL_TABLET | Freq: Every day | ORAL | 0 refills | Status: DC
Start: 2016-05-26 — End: 2016-05-26

## 2016-05-27 ENCOUNTER — Telehealth (HOSPITAL_COMMUNITY): Payer: Self-pay | Admitting: Surgery

## 2016-05-27 MED ORDER — SACUBITRIL-VALSARTAN 24-26 MG PO TABS: 1.0000 | ORAL_TABLET | Freq: Two times a day (BID) | ORAL | 5 refills | Status: DC

## 2016-05-27 NOTE — Telephone Encounter (Signed)
Patient called requesting to speak with Ed Blalock Pharm D regarding his Delene Loll.  He has a 10 dollar copay card.  He tells me that he is down to his last doses of Entresto and will be out tomorrow.  I will send message to [place].  I am not clear as to his request?   Possibly only a prescription is needed which I have forwarded to his Pharmacy on file.

## 2016-05-28 NOTE — Telephone Encounter (Signed)
Entresto 24-26 mg BID PA approved by [employer] and verified with Mr. Smoak that he has an activated $10 copay card. He should not have any issues picking up the [facility] today but I told him to call me back if there are any issues.   Ruta Hinds. Velva Harman, PharmD, BCPS, CPP Clinical Pharmacist Pager: [phone] Phone: [phone] 05/28/2016 11:44 AM

## 2016-06-04 ENCOUNTER — Ambulatory Visit (HOSPITAL_COMMUNITY)
Admission: RE | Admit: 2016-06-04 | Discharge: 2016-06-04 | Disposition: A | Discharge status: Home / Self Care | Payer: 59 | Source: Ambulatory Visit | Attending: Internal Medicine | Admitting: Internal Medicine

## 2016-06-04 DIAGNOSIS — Z87891 Personal history of nicotine dependence: Secondary | ICD-10-CM | POA: Insufficient documentation

## 2016-06-04 DIAGNOSIS — Z79899 Other long term (current) drug therapy: Secondary | ICD-10-CM | POA: Insufficient documentation

## 2016-06-04 DIAGNOSIS — I2510 Atherosclerotic heart disease of native coronary artery without angina pectoris: Secondary | ICD-10-CM | POA: Insufficient documentation

## 2016-06-04 DIAGNOSIS — I5022 Chronic systolic (congestive) heart failure: Secondary | ICD-10-CM | POA: Diagnosis not present

## 2016-06-04 DIAGNOSIS — I110 Hypertensive heart disease with heart failure: Secondary | ICD-10-CM | POA: Insufficient documentation

## 2016-06-04 DIAGNOSIS — E119 Type 2 diabetes mellitus without complications: Secondary | ICD-10-CM | POA: Insufficient documentation

## 2016-06-04 MED ORDER — FUROSEMIDE 20 MG PO TABS: 20.0000 mg | ORAL_TABLET | Freq: Every day | ORAL | 11 refills | Status: DC

## 2016-06-04 MED ORDER — SACUBITRIL-VALSARTAN 49-51 MG PO TABS: 1.0000 | ORAL_TABLET | Freq: Two times a day (BID) | ORAL | 11 refills | Status: DC

## 2016-06-04 NOTE — Progress Notes (Signed)
HF MD: Christopher Roth  HPI:  Dorwin Fitzhenry a 46 y.o.AA malewith PMH of HTN, DM2, Tobacco abuse, CAD, and systolic CHF.   Admitted 04/20/16 ->04/25/16 with new onest HF symptoms. Echo 04/21/16 LVEF 25-30%, Grade 2 DD. Diuresed 12 lbs. LHC with significantCAD as below.   He presents today for pharmacist-led HF medication titration. At his last HF clinic visit on 05/14/16, spironolactone 12.5 mg daily was started. Overall feeling well. Still able to do all ADLs without issue. He states that he has to take an additional furosemide 20 mg about 3x/week for 3 lb weight gain. Stopped smoking over 1 month ago. Was smoking up to 1 ppd for ~25 years. Has non alcoholic beer a few days a week. Staying away from alcohol. No drug use of history. 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    Marland Kitchen Shortness of breath/dyspnea on exertion? no  . Orthopnea/PND? no . Edema? no . Lightheadedness/dizziness? no . Daily weights at home? Yes - 207-210 lb  . Blood pressure/heart rate monitoring at home? Yes - BP higher lately in the [id number] systolic . Following low-sodium/fluid-restricted diet? Yes - every once in a while cheats with cold cuts/hamburger, drinking < 2 L/day  HF Medications: Carvedilol 6.25 mg PO BID Entresto 24-26 mg PO BID Furosemide 20 mg PO daily (with an additional 20 mg daily for weight gain >3 lbs in 1 day of >5 lb in 1week)  Spironolactone 12.5 mg PO daily  Has the patient been experiencing any side effects to the medications prescribed?  no  Does the patient have any problems obtaining medications due to transportation or finances?   No - [employer] commercial (has Entresto $10 copay card)  Understanding of regimen: good Understanding of indications: good Potential of compliance: good Patient understands to avoid NSAIDs. Patient understands to avoid decongestants.    Pertinent Lab Values: . 05/23/16: Serum creatinine 1.01 (BL  0.9-1), BUN 9, Potassium 4.4, Sodium 141 . 05/06/16: BNP 121.4  Vital Signs: . Weight: 215 lb (dry weight: 205-210 lb) . Blood pressure: 142/98 mmHg  . Heart rate: 61 bpm   Assessment: 1. Chronicsystolic CHF (EF 14-78%), due to ICM. NYHA class IIsymptoms.   - Volume status stable on exam despite weight up today in clinic (wearing sweatshirt and boots), at home weight stable 207-210 lb  - Continue lasix 20 mg daily with an extra as needed for lower extremity edema or weight gain > 3lbs in 1 day or >5lb in 1 week (usually does this about 3x/week)  - Increase Entresto to 49-51 mg BID  - Continue coreg 6.25 mg BID and spironolactone 12.5 mg daily   - Lifevest in place. Continue for at least 3 months prior to repeat Echo with optimization of medical therapy. Will need ICD if EF does not recover   - Could consider cMRI in the future to look for scarring/viability  - Consider CPX if functional status changes - Basic disease state pathophysiology, medication indication, mechanism and side effects reviewed at length with patient and he verbalized understanding 2. CAD via [place] 04/2016 - Continue ASA and statin - LDL 125 on 04/22/16 3. HTN - Above goal of < 130/80 mmHg  - Increase Entresto as above 4. DMII - BG on last BMET elevated - Discussed compliance with DM medications, PCP visits and low carb/glucose diet - Continue glipizide and metformin per PCP 5. Tobacco abuse - Stopped about 2 months ago - Has ~  25 pack years  Plan: 1) Medication changes: Based on clinical presentation, vital signs and recent labs will increase Entresto to 49-51 mg BID 2) Labs: BMET at next visit   3) Follow-up: Dr. Haroldine Roth on 06/26/16   Ruta Hinds. Velva Harman, PharmD, BCPS, CPP Clinical Pharmacist Pager: [phone] Phone: [phone] 06/04/2016 2:25 PM

## 2016-06-04 NOTE — Patient Instructions (Signed)
It was great to see you today!  Please INCREASE your Entresto to 49-51 mg TWICE DAILY. You can take #2 tablets TWICE daily of your 24-26 mg tablets until they're gone.   Please keep your appointment with Dr. [name] on 06/26/16.

## 2016-06-09 ENCOUNTER — Telehealth (HOSPITAL_COMMUNITY): Payer: Self-pay | Admitting: Pharmacist

## 2016-06-09 NOTE — Telephone Encounter (Signed)
Entresto PA approved by [employer] through 06/04/17.   Ruta Hinds. Velva Harman, PharmD, BCPS, CPP Clinical Pharmacist Pager: [phone] Phone: [phone] 06/09/2016 9:32 AM

## 2016-06-24 ENCOUNTER — Other Ambulatory Visit (HOSPITAL_COMMUNITY): Payer: Self-pay | Admitting: Pharmacist

## 2016-06-24 ENCOUNTER — Telehealth (HOSPITAL_COMMUNITY): Payer: Self-pay | Admitting: *Deleted

## 2016-06-24 MED ORDER — FUROSEMIDE 20 MG PO TABS: 20.0000 mg | ORAL_TABLET | Freq: Every day | ORAL | 11 refills | Status: DC

## 2016-06-24 NOTE — Telephone Encounter (Signed)
Medical records requested faxed today 06/24/16 to [facility] @ [phone]. Case# [id number]  Copy of original request sent to be scanned to patient records.

## 2016-06-26 ENCOUNTER — Ambulatory Visit (HOSPITAL_COMMUNITY)
Admission: RE | Admit: 2016-06-26 | Discharge: 2016-06-26 | Disposition: A | Discharge status: Home / Self Care | Payer: 59 | Source: Ambulatory Visit | Attending: Internal Medicine | Admitting: Internal Medicine

## 2016-06-26 ENCOUNTER — Encounter (HOSPITAL_COMMUNITY): Payer: Self-pay

## 2016-06-26 ENCOUNTER — Encounter (HOSPITAL_COMMUNITY): Payer: Self-pay | Admitting: Internal Medicine

## 2016-06-26 VITALS — BP 152/86 | HR 65 | Wt 216.8 lb

## 2016-06-26 DIAGNOSIS — I110 Hypertensive heart disease with heart failure: Secondary | ICD-10-CM | POA: Diagnosis present

## 2016-06-26 DIAGNOSIS — I5020 Unspecified systolic (congestive) heart failure: Secondary | ICD-10-CM

## 2016-06-26 DIAGNOSIS — E119 Type 2 diabetes mellitus without complications: Secondary | ICD-10-CM | POA: Insufficient documentation

## 2016-06-26 DIAGNOSIS — I2510 Atherosclerotic heart disease of native coronary artery without angina pectoris: Secondary | ICD-10-CM | POA: Diagnosis not present

## 2016-06-26 DIAGNOSIS — Z87891 Personal history of nicotine dependence: Secondary | ICD-10-CM | POA: Diagnosis not present

## 2016-06-26 DIAGNOSIS — I5042 Chronic combined systolic (congestive) and diastolic (congestive) heart failure: Secondary | ICD-10-CM | POA: Diagnosis present

## 2016-06-26 DIAGNOSIS — Z7984 Long term (current) use of oral hypoglycemic drugs: Secondary | ICD-10-CM | POA: Diagnosis not present

## 2016-06-26 MED ORDER — FUROSEMIDE 20 MG PO TABS: 20.0000 mg | ORAL_TABLET | ORAL | 11 refills | Status: DC | PRN

## 2016-06-26 MED ORDER — SACUBITRIL-VALSARTAN 97-103 MG PO TABS: 1.0000 | ORAL_TABLET | Freq: Two times a day (BID) | ORAL | 3 refills | Status: DC

## 2016-06-26 NOTE — Progress Notes (Signed)
[facility] medical record request for records 04/2015--present sent to provided # [phone] (21 pages total). Copy of request scanned into patient's electronic medical record under media tab for reference.  Renee Pain, RN

## 2016-06-26 NOTE — Progress Notes (Signed)
Advanced Heart Failure Clinic Note   Referring Physician: Crenshaw Primary Care: Christopher Levy, MD at [facility] family Primary Cardiologist: Dr Christopher Roth HF: Dr. Haroldine Roth  HPI:  Christopher Roth is a 46 y.o. male with PMH of HTN, DM2, Tobacco abuse, CAD, and systolic CHF.   Admitted 04/20/16 -> 04/25/16 with new onest HF symptoms. Echo 04/21/16 LVEF 25-30%, Grade 2 DD. Diuresed 12 lbs. LHC with significant CAD as below.   He returns to HF follow up.At last visi with PharmDt, Entresto was titrated up to 49/33m  Wearing LifeVest today, no shocks. He denies chest pain and SOB. He is able to walk throughout his neighborhood without SOB.  He is strict about his fluid intake and drinks less than 2 L. Eats out occasionally, says he is deliberate about making sure that his sodium intake is restricted. He has quit smoking and has not had any alcohol since Dec. 2017. He would like to know if he can drink a beer or 2 occasionally. Weight has been stable at home, ranges from 206-208 lbs. His weight in the office today is 216 lbs but he is wearing his LPassaicequipment.   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  Social: [place]  Family history: Brother, 469 has pacemaker -> strokes and vascular disease Mother: HTN, Diabetes Father: Diabetes  LHC 04/23/16 - Recent total occlusion of the proximal to mid LAD.  - 70% ramus intermedius, 65% ostial circumflex followed by 70% mid circumflex, 75% proximal LAD prior to the origin of a small to moderate size diagonal, and 90% mid PDA. - Mild PAH, PCWP 14 - LVEF 10-15% Hemodynamics RHC Procedural Findings: Hemodynamics (mmHg) RA mean 4 RV 39/1 PA 37/12 PCWP 14 AO 115/78 Cardiac Output (Fick) 5.63 Cardiac Index (Fick) 2.71   Past Medical History:  Diagnosis Date  . Acute systolic congestive heart failure ([facility] 04/22/2016  . Diabetes mellitus without complication ([facility]   . Hypertension   . Tobacco use  disorder 11/21/2014    Current Outpatient Prescriptions  Medication Sig Dispense Refill  . albuterol (PROVENTIL HFA;VENTOLIN HFA) 108 (90 Base) MCG/ACT inhaler Inhale 2 puffs into the lungs every 6 (six) hours as needed for wheezing or shortness of breath.    . Alcohol Swabs PADS 1 Package by Does not apply route as needed (with blood glucose checks). 1 each 0  . aspirin 325 MG tablet Take 1 tablet (325 mg total) by mouth daily. 30 tablet 0  . atorvastatin (LIPITOR) 80 MG tablet Take 1 tablet (80 mg total) by mouth daily at 6 PM. 30 tablet 5  . carvedilol (COREG) 6.25 MG tablet Take 1 tablet (6.25 mg total) by mouth 2 (two) times daily with a meal. 60 tablet 5  . furosemide (LASIX) 20 MG tablet Take 1 tablet (20 mg total) by mouth daily. Take additional 20 mg daily as needed for weight gain > 3 lb in 1 day or > 5 lb in 1 week 60 tablet 11  . glipiZIDE (GLUCOTROL XL) 10 MG 24 hr tablet Take 20 mg by mouth daily with breakfast.     . glucose blood (CHOICE DM FORA G20 TEST STRIPS) test strip Use as instructed 100 each 11  . glucose monitoring kit (FREESTYLE) monitoring kit 1 each by Does not apply route as needed for other. 1 each 0  . Lancets (FREESTYLE) lancets Use as instructed 100 each 11  . metFORMIN (GLUCOPHAGE-XR) 500 MG 24 hr tablet Take 1,000 mg  by mouth 2 (two) times daily.    . sacubitril-valsartan (ENTRESTO) 49-51 MG Take 1 tablet by mouth 2 (two) times daily. 60 tablet 11  . spironolactone (ALDACTONE) 25 MG tablet Take 0.5 tablets (12.5 mg total) by mouth daily. 15 tablet 5   No current facility-administered medications for this encounter.     Allergies  Allergen Reactions  . Christopher Roth [Sitagliptin] Hives     Social History   Social History  . Marital status: Married    Spouse name: N/A  . Number of children: N/A  . Years of education: N/A   Occupational History  . Not on file.   Social History Main Topics  . Smoking status: Former Smoker    Packs/day: 1.00    Years:  24.00    Types: Cigarettes    Quit date: 03/27/2016  . Smokeless tobacco: Never Used  . Alcohol use Yes  . Drug use: No  . Sexual activity: Not on file   Other Topics Concern  . Not on file   Social History Narrative  . No narrative on file     Family History  Problem Relation Age of Onset  . Diabetes Mellitus II Mother     Vitals:   06/26/16 1043  BP: (!) 152/86  Pulse: 65  SpO2: 100%  Weight: 216 lb 12 oz (98.3 kg)   Wt Readings from Last 3 Encounters:  06/26/16 216 lb 12 oz (98.3 kg)  06/04/16 215 lb (97.5 kg)  05/14/16 212 lb (96.2 kg)    PHYSICAL EXAM: General:  Well appearing. NAD. Wearing LifeVest.  HEENT: Normal  Neck: Supple. JVD does not appear elevated. Carotids 2+ bilat; no bruits. No lymphadenopathy or thyromegaly appreciated. Cor: PMI laterally displaced. RRR. No murmurs. LifeVest in place. Lungs: CTA Abdomen: Soft, NT, ND, no HSM. No bruits or masses. +BS  Extremities: No cyanosis, clubbing, rash. No peripheral edema.  Neuro: Alert & oriented x 3, cranial nerves grossly intact. moves all 4 extremities w/o difficulty. Affect pleasant.  ASSESSMENT & PLAN:  1. Chronic combined systolic and diastolic CHF, NYHA II : iCM with Echo 04/2016 LVEF 25-30%, Grade 2 DD (EF 10-15% by cath) - Volume status stable on exam, no edema.  - Decrease Lasix to 65m prn.  - Continue Coreg 6.25 mg BID.  - Increase Entresto to 97/1037mBID.  - Continue Spiro 2532maily.  - creatinine stable, last Scr is 1.01 in Jan 2018.  - Lifevest in place. Continue for at least 3 months prior to repeat check of EF with optimization of medical therapy. Will need ICD if EF does not recover.  - cMRI in 4-6 weeks.  - Consider CPX if functional status declines - Continue fluid restriction to < 2 L daily, sodium restriction to less than 2000 mg daily, and the importance of daily weights.  - Can consider adding Bidil in the future.  2. CAD via [place] - Continue ASA and statin - LDL  125 on 04/22/16. 3. HTN - Elevated. Entresto increased as above.  4. DMII - Per PCP 5. Tobacco abuse - Has stopped smoking since Dec. 2017 - Has ~25 pack years.   Follow up in 2 weeks for Pharmacy visit to check BP and possible titration of carvedilol +/- addition of Bidil. Lasix changed to prn. Will have cMRI in 4-6 weeks and follow up with Dr. BenHaroldine Roth EriArbutus LeasP 06/26/16  11:19 AM   Patient seen and examined with EriJettie BoozeP. We discussed  all aspects of the encounter. I agree with the assessment and plan as stated above.   He is doing well NYHA II. Volume status looks good. Agree with med changed as above. Will plan cMRI.in 4-6 week to reassess EF and viability and decide on ICD +/- possible revascularization.   Marysa Wessner,MD 10:04 PM

## 2016-06-26 NOTE — Patient Instructions (Addendum)
STOP taking lasix Daily, use as needed only for weight gain of 2 lbs or more in 24 hours  Increase Entresto to 97-103 mg (1 Tablet) Two times Daily  Cardiac Rehab has been ordered for you, they will contact you for initial appointment.   Follow up in 2 weeks with Doroteo Bradford, PharmD.  Follow up in 4-[redacted] weeks along with a Cardiac-MRI.

## 2016-06-27 ENCOUNTER — Telehealth (HOSPITAL_COMMUNITY): Payer: Self-pay | Admitting: Pharmacist

## 2016-06-27 ENCOUNTER — Telehealth (HOSPITAL_COMMUNITY): Payer: Self-pay | Admitting: *Deleted

## 2016-06-27 NOTE — Telephone Encounter (Addendum)
Entresto 97-103 mg BID PA approved by [employer] through 06/26/17. Verified $0 copay with [employer] and patient aware.   Ruta Hinds. Velva Harman, PharmD, BCPS, CPP Clinical Pharmacist Pager: [phone] Phone: [phone] 06/27/2016 4:17 PM

## 2016-06-27 NOTE — Telephone Encounter (Signed)
Pre cert submitted on [employer] for CMRI.  Additional info/office notes faxed to [phone]   Auth pending

## 2016-07-01 ENCOUNTER — Telehealth (HOSPITAL_COMMUNITY): Payer: Self-pay | Admitting: Family Medicine

## 2016-07-01 NOTE — Telephone Encounter (Addendum)
Verified [employer] insurance benefits through [employer], Co-Pay $0.00, Deductible $3000.00 pt has met all, Co-Insurance 30% Out of Pocket $0.00 pt has met all.  Reference # is [id number]... KJ  S/w pt confirming benefits and pt is going to call [employer] to verify and make sure they take out his duplicate through [employer], now its just [employer] to verify benefits. Pt states he will c/b if benefits are different than what I told him....  KJ

## 2016-07-02 ENCOUNTER — Telehealth (HOSPITAL_COMMUNITY): Payer: Self-pay | Admitting: *Deleted

## 2016-07-02 ENCOUNTER — Telehealth: Payer: Self-pay | Admitting: Internal Medicine

## 2016-07-02 NOTE — Telephone Encounter (Signed)
Message sent to [facility] to schedule appointment.  Josem Kaufmann #[id number] Valid through 07/01/16-09/29/16

## 2016-07-02 NOTE — Telephone Encounter (Signed)
Called the patient to find out what time and day he wanted his MRI.  He wants an a.m. Appointment any day.  I told him that I will call him back once I have the appointment.

## 2016-07-04 ENCOUNTER — Encounter: Payer: Self-pay | Admitting: Internal Medicine

## 2016-07-09 ENCOUNTER — Ambulatory Visit (HOSPITAL_COMMUNITY)
Admission: RE | Admit: 2016-07-09 | Discharge: 2016-07-09 | Disposition: A | Discharge status: Home / Self Care | Payer: 59 | Source: Ambulatory Visit | Attending: Cardiology | Admitting: Cardiology

## 2016-07-09 VITALS — BP 142/88 | HR 70 | Wt 217.8 lb

## 2016-07-09 DIAGNOSIS — I2510 Atherosclerotic heart disease of native coronary artery without angina pectoris: Secondary | ICD-10-CM | POA: Diagnosis not present

## 2016-07-09 DIAGNOSIS — I5020 Unspecified systolic (congestive) heart failure: Secondary | ICD-10-CM

## 2016-07-09 DIAGNOSIS — E118 Type 2 diabetes mellitus with unspecified complications: Secondary | ICD-10-CM | POA: Diagnosis not present

## 2016-07-09 DIAGNOSIS — I5022 Chronic systolic (congestive) heart failure: Secondary | ICD-10-CM | POA: Diagnosis present

## 2016-07-09 DIAGNOSIS — Z87891 Personal history of nicotine dependence: Secondary | ICD-10-CM | POA: Diagnosis not present

## 2016-07-09 DIAGNOSIS — I110 Hypertensive heart disease with heart failure: Secondary | ICD-10-CM | POA: Diagnosis present

## 2016-07-09 LAB — BASIC METABOLIC PANEL
Anion gap: 8 (ref 5–15)
BUN: 12 mg/dL (ref 6–20)
CALCIUM: 9.3 mg/dL (ref 8.9–10.3)
CO2: 26 mmol/L (ref 22–32)
CREATININE: 0.88 mg/dL (ref 0.61–1.24)
Chloride: 107 mmol/L (ref 101–111)
GFR calc Af Amer: >60 mL/min (ref >60)
GLUCOSE: 152 mg/dL — AB (ref 65–99)
Potassium: 3.7 mmol/L (ref 3.5–5.1)
SODIUM: 141 mmol/L (ref 135–145)

## 2016-07-09 MED ORDER — ISOSORB DINITRATE-HYDRALAZINE 20-37.5 MG PO TABS: 1.0000 | ORAL_TABLET | Freq: Three times a day (TID) | ORAL | 11 refills | Status: DC

## 2016-07-09 NOTE — Progress Notes (Signed)
HF MD: [name]  HPI:  Christopher Roth a 46 y.o.AA malewith PMH of HTN, DM2, Tobacco abuse, CAD, and systolic CHF.   Admitted 04/20/16 ->04/25/16 with new onest HF symptoms. Echo 04/21/16 LVEF 25-30%, Grade 2 DD. Diuresed 12 lbs. LHC with significantCAD as below.   He presents today for pharmacist-led HF medication titration. At his last HF clinic visit on 06/26/16, his Delene Loll was increased to goal dose of 97/103 mg BID. Overall feeling well. Still able to do all ADLs without issue. He states that he has only had to take furosemide 1 time in the past week for weight gain >2 lb in 1 day. Stopped smoking over 2 months ago. Was smoking up to 1 ppd for ~25 years. Has non alcoholic beer a few days a week. Staying away from alcohol. No drug use of history. 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    Marland Kitchen Shortness of breath/dyspnea on exertion? no  . Orthopnea/PND? no . Edema? no . Lightheadedness/dizziness? no . Daily weights at home? Yes - stable ~207 lb  . Blood pressure/heart rate monitoring at home? Yes - SBP 130/80 - 178/86 mmHg . Following low-sodium/fluid-restricted diet? Yes - still cheating about 1x/month with hamburger, drinking < 2 L/day  HF Medications: Carvedilol 6.25 mg PO BID Furosemide 20 mg PO PRN for weight gain >2lb in 1 day Entresto 97-103 mg PO BID Spironolactone 12.5 mg PO daily    Has the patient been experiencing any side effects to the medications prescribed?  no  Does the patient have any problems obtaining medications due to transportation or finances?   No - [employer] commercial (Entresto $10 copay card)   Understanding of regimen: good Understanding of indications: good Potential of compliance: good Patient understands to avoid NSAIDs. Patient understands to avoid decongestants.    Pertinent Lab Values: . 07/09/16: Serum creatinine 0.88, BUN 12, Potassium 3.7, Sodium 141 . 05/06/16: BNP  121  Vital Signs: . Weight: 217 lb (dry weight: 215-217 lb) . Blood pressure: 142/88 mmHg  . Heart rate: 70 bpm    Assessment: 1. Chronicsystolic CHF (EF 54-65%), due to ICM. NYHA class IIsymptoms. - Volume status stable on exam, at home weight stable 207-210 lb  - Start Bidil 1 tab TID  - Continue carvedilol 6.25 mg BID, furosemide 20 mg PRN for weight gain >2lb in 1 day, Entresto 97-103 mg BID and spironolactone 12.5 mg daily - Lifevest still in place. States that he had one episode of chest pain about a week ago when he was lifting a heavy flower pot. Advised not to lift heavy objects for the time being. Continue for at least 3 months prior to repeat Echo with optimization of medical therapy. Will need ICD if EF does not recover  - CMRI scheduled for next week  - Consider CPX if functional status changes - Basic disease state pathophysiology, medication indication, mechanism and side effects reviewed at length with patient and he verbalized understanding 2. CAD via [place] 04/2016 - Continue ASA and statin - LDL 125 on 04/22/16 3. HTN - Above goal of <130/80 mmHg  - Start Bidil as above - Informed me that he is taking an herbal product called "Michael's blood pressure tablets". Advised him to bring in the bottle at his next visit so I can review the ingredients but also advised him that these are usually not recommended 4. DMII - BG on BMET today much better and home  BG has been ranging from 70-160s - Has appointment with PCP at the end of the month  - Continue glipizide and metformin per PCP 5. Tobacco abuse - Congratulated on continued abstinence from tobacco (about 63month now) - Has ~25 pack years  Plan: 1) Medication changes: Based on clinical presentation, vital signs and recent labs will start Bidil 1 tablet TID 2) Labs: BMET today 3) Follow-up: Dr. BHaroldine Lawson 08/07/16   ERuta Hinds NVelva Harman PharmD, BCPS, CPP Clinical Pharmacist Pager: [phone]:  [phone]/11/2016 2:32 PM

## 2016-07-09 NOTE — Patient Instructions (Signed)
It was great to see you today!  Please START Bidil 1 tablet THREE TIMES DAILY.   Labs today. We will call you with any abnormalities.   Please keep your appointment with Dr. [name] on 08/07/16.

## 2016-07-10 ENCOUNTER — Telehealth (HOSPITAL_COMMUNITY): Payer: Self-pay | Admitting: *Deleted

## 2016-07-10 NOTE — Telephone Encounter (Signed)
Patient called in asking what is the maximum weight he could lift.  I advised him to not lift more than 10 lbs until further discussion at his next follow up appointment.  He understands and no further questions.

## 2016-07-16 ENCOUNTER — Telehealth (HOSPITAL_COMMUNITY): Payer: Self-pay

## 2016-07-16 ENCOUNTER — Ambulatory Visit (HOSPITAL_COMMUNITY)
Admission: RE | Admit: 2016-07-16 | Discharge: 2016-07-16 | Disposition: A | Discharge status: Home / Self Care | Payer: 59 | Source: Ambulatory Visit | Attending: Internal Medicine | Admitting: Internal Medicine

## 2016-07-16 DIAGNOSIS — I420 Dilated cardiomyopathy: Secondary | ICD-10-CM | POA: Insufficient documentation

## 2016-07-16 DIAGNOSIS — I429 Cardiomyopathy, unspecified: Secondary | ICD-10-CM | POA: Diagnosis not present

## 2016-07-16 DIAGNOSIS — I5020 Unspecified systolic (congestive) heart failure: Secondary | ICD-10-CM | POA: Diagnosis present

## 2016-07-16 MED ORDER — GADOBENATE DIMEGLUMINE 529 MG/ML IV SOLN
30.0000 mL | Freq: Once | INTRAVENOUS | Status: AC
  Administered 2016-07-16: 30 mL via INTRAVENOUS

## 2016-07-16 NOTE — Telephone Encounter (Signed)
PA for BiDil approved by Chinita Pester, PharmD PGY1 Pharmacy Resident [phone] (Pager) 07/16/2016 4:19 PM

## 2016-07-21 ENCOUNTER — Telehealth (HOSPITAL_COMMUNITY): Payer: Self-pay | Admitting: *Deleted

## 2016-07-21 NOTE — Telephone Encounter (Signed)
Received in mail on 07/17/2016:  [facility] medical record request for records 06/2016--present sent to provided # [phone]   Copy of request scanned into patient's electronic medical record under media tab for reference.

## 2016-07-22 ENCOUNTER — Encounter (HOSPITAL_COMMUNITY)
Admission: RE | Admit: 2016-07-22 | Discharge: 2016-07-22 | Disposition: A | Discharge status: Home / Self Care | Payer: 59 | Source: Ambulatory Visit | Attending: Internal Medicine | Admitting: Internal Medicine

## 2016-07-22 VITALS — BP 114/72 | HR 71 | Ht 68.25 in | Wt 214.3 lb

## 2016-07-22 DIAGNOSIS — I5022 Chronic systolic (congestive) heart failure: Secondary | ICD-10-CM | POA: Diagnosis present

## 2016-07-22 NOTE — Progress Notes (Signed)
Cardiac Individual Treatment Plan  Patient Details  Name: Christopher Roth MRN: [id number] Date of Birth: 1970/06/16 Referring Provider:     CARDIAC REHAB PHASE II ORIENTATION from 07/22/2016 in [facility]  Referring Provider  Glori Bickers MD      Initial Encounter Date:    CARDIAC REHAB PHASE II ORIENTATION from 07/22/2016 in [facility]  Date  07/22/16  Referring Provider  Glori Bickers MD      Visit Diagnosis: Heart failure, chronic systolic ([place])  Patient's Home Medications on Admission:  Current Outpatient Prescriptions:  .  albuterol (PROVENTIL HFA;VENTOLIN HFA) 108 (90 Base) MCG/ACT inhaler, Inhale 2 puffs into the lungs every 6 (six) hours as needed for wheezing or shortness of breath., Disp: , Rfl:  .  Alcohol Swabs PADS, 1 Package by Does not apply route as needed (with blood glucose checks)., Disp: 1 each, Rfl: 0 .  aspirin 325 MG tablet, Take 1 tablet (325 mg total) by mouth daily., Disp: 30 tablet, Rfl: 0 .  atorvastatin (LIPITOR) 80 MG tablet, Take 1 tablet (80 mg total) by mouth daily at 6 PM., Disp: 30 tablet, Rfl: 5 .  carvedilol (COREG) 6.25 MG tablet, Take 1 tablet (6.25 mg total) by mouth 2 (two) times daily with a meal., Disp: 60 tablet, Rfl: 5 .  glipiZIDE (GLUCOTROL XL) 10 MG 24 hr tablet, Take 20 mg by mouth daily with breakfast. , Disp: , Rfl:  .  glucose blood (CHOICE DM FORA G20 TEST STRIPS) test strip, Use as instructed, Disp: 100 each, Rfl: 11 .  glucose monitoring kit (FREESTYLE) monitoring kit, 1 each by Does not apply route as needed for other., Disp: 1 each, Rfl: 0 .  isosorbide-hydrALAZINE (BIDIL) 20-37.5 MG tablet, Take 1 tablet by mouth 3 (three) times daily., Disp: 90 tablet, Rfl: 11 .  Lancets (FREESTYLE) lancets, Use as instructed, Disp: 100 each, Rfl: 11 .  metFORMIN (GLUCOPHAGE-XR) 500 MG 24 hr tablet, Take 1,000 mg by mouth 2 (two) times daily., Disp: , Rfl:  .  OVER THE  COUNTER MEDICATION, Take 1 tablet by mouth daily. Michael's blood pressure tablets, Disp: , Rfl:  .  sacubitril-valsartan (ENTRESTO) 97-103 MG, Take 1 tablet by mouth 2 (two) times daily., Disp: 60 tablet, Rfl: 3 .  spironolactone (ALDACTONE) 25 MG tablet, Take 0.5 tablets (12.5 mg total) by mouth daily., Disp: 15 tablet, Rfl: 5 .  furosemide (LASIX) 20 MG tablet, Take 1 tablet (20 mg total) by mouth as needed. For weight gain of 2 lbs or more in 24 hours (Patient not taking: Reported on 07/22/2016), Disp: 15 tablet, Rfl: 11  Past Medical History: Past Medical History:  Diagnosis Date  . Acute systolic congestive heart failure ([place]) 04/22/2016  . Diabetes mellitus without complication ([place])   . Hypertension   . Tobacco use disorder 11/21/2014    Tobacco Use: History  Smoking Status  . Former Smoker  . Packs/day: 1.00  . Years: 24.00  . Types: Cigarettes  . Quit date: 03/27/2016  Smokeless Tobacco  . Never Used    Labs: Recent Review Flowsheet Data    Labs for ITP Cardiac and Pulmonary Rehab Latest Ref Rng & Units 11/17/2014 04/22/2016 04/23/2016 04/23/2016 04/23/2016   Cholestrol 0 - 200 mg/dL - 193 - - -   LDLCALC 0 - 99 mg/dL - 125(H) - - -   LDLDIRECT mg/dL - - - - -   HDL >40 mg/dL - 52 - - -  Trlycerides <150 mg/dL - 80 - - -   Hemoglobin A1c 4.8 - 5.6 % 12.3(H) - - - -   PHART 7.350 - 7.450 - - - 7.386 7.400   PCO2ART 32.0 - 48.0 mmHg - - - 41.7 39.8   HCO3 20.0 - 28.0 mmol/L - - 27.2 25.0 24.7   TCO2 0 - 100 mmol/L - - 28 26 26    O2SAT % - - 65.0 97.0 97.0      Capillary Blood Glucose: Lab Results  Component Value Date   GLUCAP 180 (H) 04/25/2016   GLUCAP 144 (H) 04/25/2016   GLUCAP 113 (H) 04/24/2016   GLUCAP 213 (H) 04/24/2016   GLUCAP 174 (H) 04/24/2016     Exercise Target Goals: Date: 07/22/16  Exercise Program Goal: Individual exercise prescription set with THRR, safety & activity barriers. Participant demonstrates ability to understand and report  RPE using BORG scale, to self-measure pulse accurately, and to acknowledge the importance of the exercise prescription.  Exercise Prescription Goal: Starting with aerobic activity 30 plus minutes a day, 3 days per week for initial exercise prescription. Provide home exercise prescription and guidelines that participant acknowledges understanding prior to discharge.  Activity Barriers & Risk Stratification:     Activity Barriers & Cardiac Risk Stratification - 07/22/16 0825      Activity Barriers & Cardiac Risk Stratification   Activity Barriers None   Cardiac Risk Stratification High      6 Minute Walk:     6 Minute Walk    Row Name 07/22/16 1128         6 Minute Walk   Phase Initial     Distance 1575 feet     Walk Time 6 minutes     # of Rest Breaks 0     MPH 2.98     METS 4.53     RPE 11     VO2 Peak 15.85     Symptoms No     Resting HR 71 bpm     Resting BP 114/72     Max Ex. HR 105 bpm     Max Ex. BP 132/78     2 Minute Post BP 118/72        Oxygen Initial Assessment:   Oxygen Re-Evaluation:   Oxygen Discharge (Final Oxygen Re-Evaluation):   Initial Exercise Prescription:     Initial Exercise Prescription - 07/22/16 1100      Date of Initial Exercise RX and Referring Provider   Date 07/22/16   Referring Provider Glori Bickers MD     Treadmill   MPH 2.9   Grade 1   Minutes 10   METs 3.62     Bike   Level 1   Minutes 10   METs 2.99     NuStep   Level 3   SPM 100   Minutes 10   METs 2.4     Prescription Details   Frequency (times per week) 3   Duration Progress to 30 minutes of continuous aerobic without signs/symptoms of physical distress     Intensity   THRR 40-80% of Max Heartrate 70-140   Ratings of Perceived Exertion 11-13   Perceived Dyspnea 0-4     Progression   Progression Continue to progress workloads to maintain intensity without signs/symptoms of physical distress.     Resistance Training   Training  Prescription Yes   Weight 3lbs   Reps 10-15      Perform Capillary Blood Glucose checks as  needed.  Exercise Prescription Changes:   Exercise Comments:   Exercise Goals and Review:     Exercise Goals    Row Name 07/22/16 0825 07/22/16 1151           Exercise Goals   Increase Physical Activity Yes  -      Intervention Provide advice, education, support and counseling about physical activity/exercise needs.;Develop an individualized exercise prescription for aerobic and resistive training based on initial evaluation findings, risk stratification, comorbidities and participant's personal goals.  -      Expected Outcomes Achievement of increased cardiorespiratory fitness and enhanced flexibility, muscular endurance and strength shown through measurements of functional capacity and personal statement of participant.  -      Increase Strength and Stamina Yes  -      Intervention  - Develop an individualized exercise prescription for aerobic and resistive training based on initial evaluation findings, risk stratification, comorbidities and participant's personal goals.;Provide advice, education, support and counseling about physical activity/exercise needs.      Expected Outcomes  - Achievement of increased cardiorespiratory fitness and enhanced flexibility, muscular endurance and strength shown through measurements of functional capacity and personal statement of participant.         Exercise Goals Re-Evaluation :    Discharge Exercise Prescription (Final Exercise Prescription Changes):   Nutrition:  Target Goals: Understanding of nutrition guidelines, daily intake of sodium <1544m, cholesterol <206m calories 30% from fat and 7% or less from saturated fats, daily to have 5 or more servings of fruits and vegetables.  Biometrics:     Pre Biometrics - 07/22/16 1130      Pre Biometrics   Waist Circumference 37.5 inches   Hip Circumference 41.5 inches   Waist to Hip Ratio  0.9 %   Triceps Skinfold 15 mm   % Body Fat 27.2 %   Grip Strength 44 kg   Flexibility 8 in   Single Leg Stand 3 seconds       Nutrition Therapy Plan and Nutrition Goals:   Nutrition Discharge: Nutrition Scores:   Nutrition Goals Re-Evaluation:   Nutrition Goals Re-Evaluation:   Nutrition Goals Discharge (Final Nutrition Goals Re-Evaluation):   Psychosocial: Target Goals: Acknowledge presence or absence of significant depression and/or stress, maximize coping skills, provide positive support system. Participant is able to verbalize types and ability to use techniques and skills needed for reducing stress and depression.  Initial Review & Psychosocial Screening:     Initial Psych Review & Screening - 07/22/16 1709      Initial Review   Current issues with Current Stress Concerns;Current Anxiety/Panic   Source of Stress Concerns Chronic Illness;Unable to perform yard/household activities;Unable to participate in former interests or hobbies     [place]  wife, children, grandchildren      Barriers   Psychosocial barriers to participate in program The patient should benefit from training in stress management and relaxation.     Screening Interventions   Interventions Encouraged to exercise;To provide support and resources with identified psychosocial needs;Provide feedback about the scores to participant      Quality of Life Scores:     Quality of Life - 07/22/16 1137      Quality of Life Scores   Health/Function Pre 19.6 %   Socioeconomic Pre 18.75 %   Psych/Spiritual Pre 28.29 %   Family Pre 26.4 %   GLOBAL Pre 22.11 %      PHQ-9: Recent Review Flowsheet Data  There is no flowsheet data to display.     Interpretation of Total Score  Total Score Depression Severity:  1-4 = Minimal depression, 5-9 = Mild depression, 10-14 = Moderate depression, 15-19 = Moderately severe depression, 20-27 = Severe depression    Psychosocial Evaluation and Intervention:   Psychosocial Re-Evaluation:   Psychosocial Discharge (Final Psychosocial Re-Evaluation):   Vocational Rehabilitation: Provide vocational rehab assistance to qualifying candidates.   Vocational Rehab Evaluation & Intervention:     Vocational Rehab - 07/22/16 1708      Initial Vocational Rehab Evaluation & Intervention   Assessment shows need for Vocational Rehabilitation Yes      Education: Education Goals: Education classes will be provided on a weekly basis, covering required topics. Participant will state understanding/return demonstration of topics presented.  Learning Barriers/Preferences:     Learning Barriers/Preferences - 07/22/16 0825      Learning Barriers/Preferences   Learning Barriers Sight   Learning Preferences Skilled Demonstration;Video;Pictoral      Education Topics: Count Your Pulse:  -Group instruction provided by verbal instruction, demonstration, patient participation and written materials to support subject.  Instructors address importance of being able to find your pulse and how to count your pulse when at home without a heart monitor.  Patients get hands on experience counting their pulse with staff help and individually.   Heart Attack, Angina, and Risk Factor Modification:  -Group instruction provided by verbal instruction, video, and written materials to support subject.  Instructors address signs and symptoms of angina and heart attacks.    Also discuss risk factors for heart disease and how to make changes to improve heart health risk factors.   Functional Fitness:  -Group instruction provided by verbal instruction, demonstration, patient participation, and written materials to support subject.  Instructors address safety measures for doing things around the house.  Discuss how to get up and down off the floor, how to pick things up properly, how to safely get out of a chair without assistance,  and balance training.   Meditation and Mindfulness:  -Group instruction provided by verbal instruction, patient participation, and written materials to support subject.  Instructor addresses importance of mindfulness and meditation practice to help reduce stress and improve awareness.  Instructor also leads participants through a meditation exercise.    Stretching for Flexibility and Mobility:  -Group instruction provided by verbal instruction, patient participation, and written materials to support subject.  Instructors lead participants through series of stretches that are designed to increase flexibility thus improving mobility.  These stretches are additional exercise for major muscle groups that are typically performed during regular warm up and cool down.   Hands Only CPR Anytime:  -Group instruction provided by verbal instruction, video, patient participation and written materials to support subject.  Instructors co-teach with AHA video for hands only CPR.  Participants get hands on experience with mannequins.   Nutrition I class: Heart Healthy Eating:  -Group instruction provided by PowerPoint slides, verbal discussion, and written materials to support subject matter. The instructor gives an explanation and review of the Therapeutic Lifestyle Changes diet recommendations, which includes a discussion on lipid goals, dietary fat, sodium, fiber, plant stanol/sterol esters, sugar, and the components of a well-balanced, healthy diet.   Nutrition II class: Lifestyle Skills:  -Group instruction provided by PowerPoint slides, verbal discussion, and written materials to support subject matter. The instructor gives an explanation and review of label reading, grocery shopping for heart health, heart healthy recipe modifications, and ways to make healthier  choices when eating out.   Diabetes Question & Answer:  -Group instruction provided by PowerPoint slides, verbal discussion, and written  materials to support subject matter. The instructor gives an explanation and review of diabetes co-morbidities, pre- and post-prandial blood glucose goals, pre-exercise blood glucose goals, signs, symptoms, and treatment of hypoglycemia and hyperglycemia, and foot care basics.   Diabetes Blitz:  -Group instruction provided by PowerPoint slides, verbal discussion, and written materials to support subject matter. The instructor gives an explanation and review of the physiology behind type 1 and type 2 diabetes, diabetes medications and rational behind using different medications, pre- and post-prandial blood glucose recommendations and Hemoglobin A1c goals, diabetes diet, and exercise including blood glucose guidelines for exercising safely.    Portion Distortion:  -Group instruction provided by PowerPoint slides, verbal discussion, written materials, and food models to support subject matter. The instructor gives an explanation of serving size versus portion size, changes in portions sizes over the last 20 years, and what consists of a serving from each food group.   Stress Management:  -Group instruction provided by verbal instruction, video, and written materials to support subject matter.  Instructors review role of stress in heart disease and how to cope with stress positively.     Exercising on Your Own:  -Group instruction provided by verbal instruction, power point, and written materials to support subject.  Instructors discuss benefits of exercise, components of exercise, frequency and intensity of exercise, and end points for exercise.  Also discuss use of nitroglycerin and activating EMS.  Review options of places to exercise outside of rehab.  Review guidelines for sex with heart disease.   Cardiac Drugs I:  -Group instruction provided by verbal instruction and written materials to support subject.  Instructor reviews cardiac drug classes: antiplatelets, anticoagulants, beta blockers,  and statins.  Instructor discusses reasons, side effects, and lifestyle considerations for each drug class.   Cardiac Drugs II:  -Group instruction provided by verbal instruction and written materials to support subject.  Instructor reviews cardiac drug classes: angiotensin converting enzyme inhibitors (ACE-I), angiotensin II receptor blockers (ARBs), nitrates, and calcium channel blockers.  Instructor discusses reasons, side effects, and lifestyle considerations for each drug class.   Anatomy and Physiology of the Circulatory System:  -Group instruction provided by verbal instruction, video, and written materials to support subject.  Reviews functional anatomy of heart, how it relates to various diagnoses, and what role the heart plays in the overall system.   Knowledge Questionnaire Score:     Knowledge Questionnaire Score - 07/22/16 1128      Knowledge Questionnaire Score   Pre Score 21/24      Core Components/Risk Factors/Patient Goals at Admission:     Personal Goals and Risk Factors at Admission - 07/22/16 0826      Core Components/Risk Factors/Patient Goals on Admission    Weight Management Yes;Weight Maintenance;Obesity;Weight Loss   Intervention Weight Management: Develop a combined nutrition and exercise program designed to reach desired caloric intake, while maintaining appropriate intake of nutrient and fiber, sodium and fats, and appropriate energy expenditure required for the weight goal.;Weight Management: Provide education and appropriate resources to help participant work on and attain dietary goals.;Weight Management/Obesity: Establish reasonable short term and long term weight goals.;Obesity: Provide education and appropriate resources to help participant work on and attain dietary goals.   Expected Outcomes Short Term: Continue to assess and modify interventions until short term weight is achieved;Weight Maintenance: Understanding of the daily nutrition guidelines,  which includes  25-35% calories from fat, 7% or less cal from saturated fats, less than 266m cholesterol, less than 1.5gm of sodium, & 5 or more servings of fruits and vegetables daily;Weight Loss: Understanding of general recommendations for a balanced deficit meal plan, which promotes 1-2 lb weight loss per week and includes a negative energy balance of [phone] kcal/d;Understanding recommendations for meals to include 15-35% energy as protein, 25-35% energy from fat, 35-60% energy from carbohydrates, less than 2071mof dietary cholesterol, 20-35 gm of total fiber daily;Understanding of distribution of calorie intake throughout the day with the consumption of 4-5 meals/snacks   Improve shortness of breath with ADL's Yes   Intervention Provide education, individualized exercise plan and daily activity instruction to help decrease symptoms of SOB with activities of daily living.   Expected Outcomes Short Term: Achieves a reduction of symptoms when performing activities of daily living.   Diabetes Yes   Intervention Provide education about signs/symptoms and action to take for hypo/hyperglycemia.;Provide education about proper nutrition, including hydration, and aerobic/resistive exercise prescription along with prescribed medications to achieve blood glucose in normal ranges: Fasting glucose 65-99 mg/dL   Expected Outcomes Short Term: Participant verbalizes understanding of the signs/symptoms and immediate care of hyper/hypoglycemia, proper foot care and importance of medication, aerobic/resistive exercise and nutrition plan for blood glucose control.;Long Term: Attainment of HbA1C < 7%.   Heart Failure Yes   Intervention Provide a combined exercise and nutrition program that is supplemented with education, support and counseling about heart failure. Directed toward relieving symptoms such as shortness of breath, decreased exercise tolerance, and extremity edema.   Expected Outcomes Improve functional  capacity of life;Short term: Attendance in program 2-3 days a week with increased exercise capacity. Reported lower sodium intake. Reported increased fruit and vegetable intake. Reports medication compliance.;Short term: Daily weights obtained and reported for increase. Utilizing diuretic protocols set by physician.;Long term: Adoption of self-care skills and reduction of barriers for early signs and symptoms recognition and intervention leading to self-care maintenance.   Hypertension Yes   Intervention Provide education on lifestyle modifcations including regular physical activity/exercise, weight management, moderate sodium restriction and increased consumption of fresh fruit, vegetables, and low fat dairy, alcohol moderation, and smoking cessation.;Monitor prescription use compliance.   Expected Outcomes Short Term: Continued assessment and intervention until BP is < 140/9081mG in hypertensive participants. < 130/26m46m in hypertensive participants with diabetes, heart failure or chronic kidney disease.;Long Term: Maintenance of blood pressure at goal levels.   Lipids Yes   Intervention Provide education and support for participant on nutrition & aerobic/resistive exercise along with prescribed medications to achieve LDL <70mg37mL >40mg.31mxpected Outcomes Short Term: Participant states understanding of desired cholesterol values and is compliant with medications prescribed. Participant is following exercise prescription and nutrition guidelines.;Long Term: Cholesterol controlled with medications as prescribed, with individualized exercise RX and with personalized nutrition plan. Value goals: LDL < 70mg, 26m> 40 mg.   Stress Yes   Intervention Offer individual and/or small group education and counseling on adjustment to heart disease, stress management and health-related lifestyle change. Teach and support self-help strategies.;Refer participants experiencing significant psychosocial distress to  appropriate mental health specialists for further evaluation and treatment. When possible, include family members and significant others in education/counseling sessions.   Expected Outcomes Short Term: Participant demonstrates changes in health-related behavior, relaxation and other stress management skills, ability to obtain effective social support, and compliance with psychotropic medications if prescribed.;Long Term: Emotional wellbeing is indicated by absence of  clinically significant psychosocial distress or social isolation.      Core Components/Risk Factors/Patient Goals Review:    Core Components/Risk Factors/Patient Goals at Discharge (Final Review):    ITP Comments:     ITP Comments    Row Name 07/22/16 0826           ITP Comments Dr. Fransico Him, Medical Director          Comments: Patient attended orientation from [phone] to 1007am  to review rules and guidelines for program. Completed 6 minute walk test, Intitial ITP, and exercise prescription.  VSS. Telemetry-sinus rhythm.  Asymptomatic.

## 2016-07-22 NOTE — Progress Notes (Signed)
Cardiac Rehab Medication Review by a Pharmacist  Does the patient  feel that his/her medications are working for him/her?  yes  Has the patient been experiencing any side effects to the medications prescribed?  no  Does the patient measure his/her own blood pressure or blood glucose at home?  yes - patient reports BP < 130/80 (running low at times, no symptoms) and BG < 180   Does the patient have any problems obtaining medications due to transportation or finances?   no  Understanding of regimen: good Understanding of indications: good Potential of compliance: good    Pharmacist comments: Mr. Hayhurst is a pleasant AA male presenting to cardiac rehab in good spirits without assistance. He endorses compliance with his medications. He asked about grapefruit interaction with his medications today. I advised him that eating 1/2 grapefruit a few times a week would be okay, but to avoid eating a full grapefruit and drinking grapefruit juice to avoid interactions with his medications. No other questions or concerns about his regimen today.    Belia Heman, PharmD PGY1 Pharmacy Resident [phone] (Pager) 07/22/2016 8:53 AM

## 2016-07-28 ENCOUNTER — Encounter (HOSPITAL_COMMUNITY)
Admission: RE | Admit: 2016-07-28 | Discharge: 2016-07-28 | Disposition: A | Discharge status: Home / Self Care | Payer: 59 | Source: Ambulatory Visit | Attending: Internal Medicine | Admitting: Internal Medicine

## 2016-07-28 ENCOUNTER — Encounter (HOSPITAL_COMMUNITY): Payer: Self-pay

## 2016-07-28 DIAGNOSIS — I5022 Chronic systolic (congestive) heart failure: Secondary | ICD-10-CM

## 2016-07-28 LAB — GLUCOSE, CAPILLARY
GLUCOSE-CAPILLARY: 141 mg/dL — AB (ref 65–99)
Glucose-Capillary: 185 mg/dL — ABNORMAL HIGH (ref 65–99)

## 2016-07-28 NOTE — Progress Notes (Signed)
Daily Session Note  Patient Details  Name: Christopher Roth MRN: [id number] Date of Birth: 1970-06-29 Referring Provider:     CARDIAC REHAB PHASE II ORIENTATION from 07/22/2016 in [place]  Referring Provider  Glori Bickers MD      Encounter Date: 07/28/2016  Check In:     Session Check In - 07/28/16 0732      Check-In   Location [facility]   Staff Present Cleda Mccreedy, MS, Exercise Physiologist;Joann Rion, RN, Deland Pretty, MS, ACSM CEP, Exercise Physiologist   Supervising physician immediately available to respond to emergencies [facility] immediately available   Physician(s) Dr. Tana Coast   Medication changes reported     No   Tobacco Cessation No Change   Warm-up and Cool-down Performed as group-led instruction   Resistance Training Performed Yes   VAD Patient? No     Pain Assessment   Currently in Pain? No/denies   Multiple Pain Sites No      Capillary Blood Glucose: Results for orders placed or performed during the hospital encounter of 07/28/16 (from the past 24 hour(s))  Glucose, capillary     Status: Abnormal   Collection Time: 07/28/16  6:57 AM  Result Value Ref Range   Glucose-Capillary 185 (H) 65 - 99 mg/dL  Glucose, capillary     Status: Abnormal   Collection Time: 07/28/16  8:01 AM  Result Value Ref Range   Glucose-Capillary 141 (H) 65 - 99 mg/dL      History  Smoking Status  . Former Smoker  . Packs/day: 1.00  . Years: 24.00  . Types: Cigarettes  . Quit date: 03/27/2016  Smokeless Tobacco  . Never Used    Goals Met:  Exercise tolerated well  Goals Unmet:  Not Applicable  Comments: Pt started cardiac rehab today.  Pt tolerated light exercise without difficulty. VSS, telemetry-sinus rhythm, asymptomatic. Pt is wearing lifevest however reports it has been discontinued by his cardiologist.  Medication list reconciled. Pt denies barriers to medicaiton compliance.  PSYCHOSOCIAL  ASSESSMENT:  PHQ-0. Pt exhibits positive coping skills, hopeful outlook with supportive family. Pt psychosocial concerns are health related anxiety and current inability to work.  This has caused financial strain.  Voc Rehab packet given. Pt enjoys cooking, music, watching sports and spending time with his 3 grandchildren, ages 52,7, and 2 months.  Pt goals for cardiac rehab are to improve heart health, decrease dyspnea and lose weight.  Pt encouraged to participate in home exercise as directed in addition to cardiac rehab activities.  Pt also encouraged to attend nutrition education classes.     Pt oriented to exercise equipment and routine.    Understanding verbalized.   Dr. Fransico Him is Medical Director for Cardiac Rehab at [facility].

## 2016-07-29 ENCOUNTER — Encounter (HOSPITAL_COMMUNITY): Payer: Self-pay

## 2016-07-29 NOTE — Progress Notes (Signed)
Cardiac Individual Treatment Plan  Patient Details  Name: Christopher Roth MRN: [id number] Date of Birth: November 23, 1970 Referring Provider:     CARDIAC REHAB PHASE II ORIENTATION from 07/22/2016 in [facility]  Referring Provider  Glori Bickers MD      Initial Encounter Date:    CARDIAC REHAB PHASE II ORIENTATION from 07/22/2016 in [facility]  Date  07/22/16  Referring Provider  Glori Bickers MD      Visit Diagnosis: Heart failure, chronic systolic ([place])  Patient's Home Medications on Admission:  Current Outpatient Prescriptions:  .  albuterol (PROVENTIL HFA;VENTOLIN HFA) 108 (90 Base) MCG/ACT inhaler, Inhale 2 puffs into the lungs every 6 (six) hours as needed for wheezing or shortness of breath., Disp: , Rfl:  .  Alcohol Swabs PADS, 1 Package by Does not apply route as needed (with blood glucose checks)., Disp: 1 each, Rfl: 0 .  aspirin 325 MG tablet, Take 1 tablet (325 mg total) by mouth daily., Disp: 30 tablet, Rfl: 0 .  atorvastatin (LIPITOR) 80 MG tablet, Take 1 tablet (80 mg total) by mouth daily at 6 PM., Disp: 30 tablet, Rfl: 5 .  carvedilol (COREG) 6.25 MG tablet, Take 1 tablet (6.25 mg total) by mouth 2 (two) times daily with a meal., Disp: 60 tablet, Rfl: 5 .  furosemide (LASIX) 20 MG tablet, Take 1 tablet (20 mg total) by mouth as needed. For weight gain of 2 lbs or more in 24 hours, Disp: 15 tablet, Rfl: 11 .  glipiZIDE (GLUCOTROL XL) 10 MG 24 hr tablet, Take 20 mg by mouth daily with breakfast. , Disp: , Rfl:  .  glucose blood (CHOICE DM FORA G20 TEST STRIPS) test strip, Use as instructed, Disp: 100 each, Rfl: 11 .  glucose monitoring kit (FREESTYLE) monitoring kit, 1 each by Does not apply route as needed for other., Disp: 1 each, Rfl: 0 .  isosorbide-hydrALAZINE (BIDIL) 20-37.5 MG tablet, Take 1 tablet by mouth 3 (three) times daily., Disp: 90 tablet, Rfl: 11 .  Lancets (FREESTYLE) lancets, Use as  instructed, Disp: 100 each, Rfl: 11 .  metFORMIN (GLUCOPHAGE-XR) 500 MG 24 hr tablet, Take 1,000 mg by mouth 2 (two) times daily., Disp: , Rfl:  .  OVER THE COUNTER MEDICATION, Take 1 tablet by mouth daily. Michael's blood pressure tablets, Disp: , Rfl:  .  sacubitril-valsartan (ENTRESTO) 97-103 MG, Take 1 tablet by mouth 2 (two) times daily., Disp: 60 tablet, Rfl: 3 .  spironolactone (ALDACTONE) 25 MG tablet, Take 0.5 tablets (12.5 mg total) by mouth daily., Disp: 15 tablet, Rfl: 5  Past Medical History: Past Medical History:  Diagnosis Date  . Acute systolic congestive heart failure ([place]) 04/22/2016  . Diabetes mellitus without complication ([place])   . Hypertension   . Tobacco use disorder 11/21/2014    Tobacco Use: History  Smoking Status  . Former Smoker  . Packs/day: 1.00  . Years: 24.00  . Types: Cigarettes  . Quit date: 03/27/2016  Smokeless Tobacco  . Never Used    Labs: Recent Review Flowsheet Data    Labs for ITP Cardiac and Pulmonary Rehab Latest Ref Rng & Units 11/17/2014 04/22/2016 04/23/2016 04/23/2016 04/23/2016   Cholestrol 0 - 200 mg/dL - 193 - - -   LDLCALC 0 - 99 mg/dL - 125(H) - - -   LDLDIRECT mg/dL - - - - -   HDL >40 mg/dL - 52 - - -   Trlycerides <150 mg/dL - 80 - - -  Hemoglobin A1c 4.8 - 5.6 % 12.3(H) - - - -   PHART 7.350 - 7.450 - - - 7.386 7.400   PCO2ART 32.0 - 48.0 mmHg - - - 41.7 39.8   HCO3 20.0 - 28.0 mmol/L - - 27.2 25.0 24.7   TCO2 0 - 100 mmol/L - - 28 26 26    O2SAT % - - 65.0 97.0 97.0      Capillary Blood Glucose: Lab Results  Component Value Date   GLUCAP 141 (H) 07/28/2016   GLUCAP 185 (H) 07/28/2016   GLUCAP 180 (H) 04/25/2016   GLUCAP 144 (H) 04/25/2016   GLUCAP 113 (H) 04/24/2016     Exercise Target Goals:    Exercise Program Goal: Individual exercise prescription set with THRR, safety & activity barriers. Participant demonstrates ability to understand and report RPE using BORG scale, to self-measure pulse accurately,  and to acknowledge the importance of the exercise prescription.  Exercise Prescription Goal: Starting with aerobic activity 30 plus minutes a day, 3 days per week for initial exercise prescription. Provide home exercise prescription and guidelines that participant acknowledges understanding prior to discharge.  Activity Barriers & Risk Stratification:     Activity Barriers & Cardiac Risk Stratification - 07/22/16 0825      Activity Barriers & Cardiac Risk Stratification   Activity Barriers None   Cardiac Risk Stratification High      6 Minute Walk:     6 Minute Walk    Row Name 07/22/16 1128         6 Minute Walk   Phase Initial     Distance 1575 feet     Walk Time 6 minutes     # of Rest Breaks 0     MPH 2.98     METS 4.53     RPE 11     VO2 Peak 15.85     Symptoms No     Resting HR 71 bpm     Resting BP 114/72     Max Ex. HR 105 bpm     Max Ex. BP 132/78     2 Minute Post BP 118/72        Oxygen Initial Assessment:   Oxygen Re-Evaluation:   Oxygen Discharge (Final Oxygen Re-Evaluation):   Initial Exercise Prescription:     Initial Exercise Prescription - 07/22/16 1100      Date of Initial Exercise RX and Referring Provider   Date 07/22/16   Referring Provider Glori Bickers MD     Treadmill   MPH 2.9   Grade 1   Minutes 10   METs 3.62     Bike   Level 1   Minutes 10   METs 2.99     NuStep   Level 3   SPM 100   Minutes 10   METs 2.4     Prescription Details   Frequency (times per week) 3   Duration Progress to 30 minutes of continuous aerobic without signs/symptoms of physical distress     Intensity   THRR 40-80% of Max Heartrate 70-140   Ratings of Perceived Exertion 11-13   Perceived Dyspnea 0-4     Progression   Progression Continue to progress workloads to maintain intensity without signs/symptoms of physical distress.     Resistance Training   Training Prescription Yes   Weight 3lbs   Reps 10-15      Perform  Capillary Blood Glucose checks as needed.  Exercise Prescription Changes:   Exercise Comments:  Exercise Comments    Row Name 07/28/16 1623           Exercise Comments Off to a great start with exercise!          Exercise Goals and Review:     Exercise Goals    Row Name 07/22/16 0825 07/22/16 1151           Exercise Goals   Increase Physical Activity Yes  -      Intervention Provide advice, education, support and counseling about physical activity/exercise needs.;Develop an individualized exercise prescription for aerobic and resistive training based on initial evaluation findings, risk stratification, comorbidities and participant's personal goals.  -      Expected Outcomes Achievement of increased cardiorespiratory fitness and enhanced flexibility, muscular endurance and strength shown through measurements of functional capacity and personal statement of participant.  -      Increase Strength and Stamina Yes  -      Intervention  - Develop an individualized exercise prescription for aerobic and resistive training based on initial evaluation findings, risk stratification, comorbidities and participant's personal goals.;Provide advice, education, support and counseling about physical activity/exercise needs.      Expected Outcomes  - Achievement of increased cardiorespiratory fitness and enhanced flexibility, muscular endurance and strength shown through measurements of functional capacity and personal statement of participant.         Exercise Goals Re-Evaluation :    Discharge Exercise Prescription (Final Exercise Prescription Changes):   Nutrition:  Target Goals: Understanding of nutrition guidelines, daily intake of sodium <1533m, cholesterol <2034m calories 30% from fat and 7% or less from saturated fats, daily to have 5 or more servings of fruits and vegetables.  Biometrics:     Pre Biometrics - 07/22/16 1130      Pre Biometrics   Waist Circumference  37.5 inches   Hip Circumference 41.5 inches   Waist to Hip Ratio 0.9 %   Triceps Skinfold 15 mm   % Body Fat 27.2 %   Grip Strength 44 kg   Flexibility 8 in   Single Leg Stand 3 seconds       Nutrition Therapy Plan and Nutrition Goals:     Nutrition Therapy & Goals - 07/23/16 0836      Nutrition Therapy   Diet Carb Modified, Therapeutic Lifestyle Changes     Personal Nutrition Goals   Nutrition Goal Wt loss of 1-2 lb/week to a wt loss goal of 6-24 lb [facility] regarding individualized specific dietary modifications aiming towards targeted core components such as weight, hypertension, lipid management, diabetes, heart failure and other comorbidities.   Expected Outcomes Short Term Goal: Understand basic principles of dietary content, such as calories, fat, sodium, cholesterol and nutrients.;Long Term Goal: Adherence to prescribed nutrition plan.      Nutrition Discharge: Nutrition Scores:   Nutrition Goals Re-Evaluation:   Nutrition Goals Re-Evaluation:   Nutrition Goals Discharge (Final Nutrition Goals Re-Evaluation):   Psychosocial: Target Goals: Acknowledge presence or absence of significant depression and/or stress, maximize coping skills, provide positive support system. Participant is able to verbalize types and ability to use techniques and skills needed for reducing stress and depression.  Initial Review & Psychosocial Screening:     Initial Psych Review & Screening - 07/22/16 1709      Initial Review   Current issues with Current Stress Concerns;Current Anxiety/Panic   Source of Stress Concerns Chronic  Illness;Unable to perform yard/household activities;Unable to participate in former interests or hobbies     [place]? Yes  wife, children, grandchildren      Barriers   Psychosocial barriers to participate in program The patient  should benefit from training in stress management and relaxation.     Screening Interventions   Interventions Encouraged to exercise;To provide support and resources with identified psychosocial needs;Provide feedback about the scores to participant      Quality of Life Scores:     Quality of Life - 07/28/16 1039      Quality of Life Scores   Health/Function Pre --  pt scores reflect health related anxiety. pt reports dyspnea with exposure to cleaning products.  pt encouraged to wear a mask and good ventilation when using cleaning products.     Socioeconomic Pre --  pt concerns are current inability to return to work.  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  pt instructed return to work clearance is determined by Physicians.  Voc Rehab packet given.      PHQ-9: Recent Review Flowsheet Data    Depression screen [facility] 2/9 07/29/2016   Decreased Interest 0   Down, Depressed, Hopeless 0   PHQ - 2 Score 0     Interpretation of Total Score  Total Score Depression Severity:  1-4 = Minimal depression, 5-9 = Mild depression, 10-14 = Moderate depression, 15-19 = Moderately severe depression, 20-27 = Severe depression   Psychosocial Evaluation and Intervention:     Psychosocial Evaluation - 07/28/16 1036      Psychosocial Evaluation & Interventions   Interventions Encouraged to exercise with the program and follow exercise prescription;Stress management education;Relaxation education   Comments pt exhibits health related anxiety. pt encouraged to exercise and participate in lifestyle  modification education classes.     Expected Outcomes pt will exhibit hopeful, positive attitude with good coping skills.    Continue Psychosocial Services  Follow up required by staff      Psychosocial Re-Evaluation:   Psychosocial Discharge (Final Psychosocial Re-Evaluation):   Vocational Rehabilitation: Provide vocational rehab assistance to qualifying  candidates.   Vocational Rehab Evaluation & Intervention:     Vocational Rehab - 07/28/16 1043      Initial Vocational Rehab Evaluation & Intervention   Assessment shows need for Vocational Rehabilitation Yes   Vocational Rehab Packet given to patient 07/28/16      Education: Education Goals: Education classes will be provided on a weekly basis, covering required topics. Participant will state understanding/return demonstration of topics presented.  Learning Barriers/Preferences:     Learning Barriers/Preferences - 07/22/16 0825      Learning Barriers/Preferences   Learning Barriers Sight   Learning Preferences Skilled Demonstration;Video;Pictoral      Education Topics: Count Your Pulse:  -Group instruction provided by verbal instruction, demonstration, patient participation and written materials to support subject.  Instructors address importance of being able to find your pulse and how to count your pulse when at home without a heart monitor.  Patients get hands on experience counting their pulse with staff help and individually.   Heart Attack, Angina, and Risk Factor Modification:  -Group instruction provided by verbal instruction, video, and written materials to support subject.  Instructors address signs and symptoms of angina and heart attacks.    Also discuss risk factors for heart disease and how to make changes to improve heart health risk factors.  Functional Fitness:  -Group instruction provided by verbal instruction, demonstration, patient participation, and written materials to support subject.  Instructors address safety measures for doing things around the house.  Discuss how to get up and down off the floor, how to pick things up properly, how to safely get out of a chair without assistance, and balance training.   Meditation and Mindfulness:  -Group instruction provided by verbal instruction, patient participation, and written materials to support subject.   Instructor addresses importance of mindfulness and meditation practice to help reduce stress and improve awareness.  Instructor also leads participants through a meditation exercise.    Stretching for Flexibility and Mobility:  -Group instruction provided by verbal instruction, patient participation, and written materials to support subject.  Instructors lead participants through series of stretches that are designed to increase flexibility thus improving mobility.  These stretches are additional exercise for major muscle groups that are typically performed during regular warm up and cool down.   Hands Only CPR Anytime:  -Group instruction provided by verbal instruction, video, patient participation and written materials to support subject.  Instructors co-teach with AHA video for hands only CPR.  Participants get hands on experience with mannequins.   Nutrition I class: Heart Healthy Eating:  -Group instruction provided by PowerPoint slides, verbal discussion, and written materials to support subject matter. The instructor gives an explanation and review of the Therapeutic Lifestyle Changes diet recommendations, which includes a discussion on lipid goals, dietary fat, sodium, fiber, plant stanol/sterol esters, sugar, and the components of a well-balanced, healthy diet.   Nutrition II class: Lifestyle Skills:  -Group instruction provided by PowerPoint slides, verbal discussion, and written materials to support subject matter. The instructor gives an explanation and review of label reading, grocery shopping for heart health, heart healthy recipe modifications, and ways to make healthier choices when eating out.   Diabetes Question & Answer:  -Group instruction provided by PowerPoint slides, verbal discussion, and written materials to support subject matter. The instructor gives an explanation and review of diabetes co-morbidities, pre- and post-prandial blood glucose goals, pre-exercise blood  glucose goals, signs, symptoms, and treatment of hypoglycemia and hyperglycemia, and foot care basics.   Diabetes Blitz:  -Group instruction provided by PowerPoint slides, verbal discussion, and written materials to support subject matter. The instructor gives an explanation and review of the physiology behind type 1 and type 2 diabetes, diabetes medications and rational behind using different medications, pre- and post-prandial blood glucose recommendations and Hemoglobin A1c goals, diabetes diet, and exercise including blood glucose guidelines for exercising safely.    Portion Distortion:  -Group instruction provided by PowerPoint slides, verbal discussion, written materials, and food models to support subject matter. The instructor gives an explanation of serving size versus portion size, changes in portions sizes over the last 20 years, and what consists of a serving from each food group.   Stress Management:  -Group instruction provided by verbal instruction, video, and written materials to support subject matter.  Instructors review role of stress in heart disease and how to cope with stress positively.     Exercising on Your Own:  -Group instruction provided by verbal instruction, power point, and written materials to support subject.  Instructors discuss benefits of exercise, components of exercise, frequency and intensity of exercise, and end points for exercise.  Also discuss use of nitroglycerin and activating EMS.  Review options of places to exercise outside of rehab.  Review guidelines for sex with heart disease.   Cardiac Drugs  I:  -Group instruction provided by verbal instruction and written materials to support subject.  Instructor reviews cardiac drug classes: antiplatelets, anticoagulants, beta blockers, and statins.  Instructor discusses reasons, side effects, and lifestyle considerations for each drug class.   Cardiac Drugs II:  -Group instruction provided by verbal  instruction and written materials to support subject.  Instructor reviews cardiac drug classes: angiotensin converting enzyme inhibitors (ACE-I), angiotensin II receptor blockers (ARBs), nitrates, and calcium channel blockers.  Instructor discusses reasons, side effects, and lifestyle considerations for each drug class.   Anatomy and Physiology of the Circulatory System:  -Group instruction provided by verbal instruction, video, and written materials to support subject.  Reviews functional anatomy of heart, how it relates to various diagnoses, and what role the heart plays in the overall system.   Knowledge Questionnaire Score:     Knowledge Questionnaire Score - 07/22/16 1128      Knowledge Questionnaire Score   Pre Score 21/24      Core Components/Risk Factors/Patient Goals at Admission:     Personal Goals and Risk Factors at Admission - 07/22/16 0826      Core Components/Risk Factors/Patient Goals on Admission    Weight Management Yes;Obesity;Weight Loss   Intervention Weight Management: Develop a combined nutrition and exercise program designed to reach desired caloric intake, while maintaining appropriate intake of nutrient and fiber, sodium and fats, and appropriate energy expenditure required for the weight goal.;Weight Management: Provide education and appropriate resources to help participant work on and attain dietary goals.;Weight Management/Obesity: Establish reasonable short term and long term weight goals.;Obesity: Provide education and appropriate resources to help participant work on and attain dietary goals.   Expected Outcomes Short Term: Continue to assess and modify interventions until short term weight is achieved;Weight Maintenance: Understanding of the daily nutrition guidelines, which includes 25-35% calories from fat, 7% or less cal from saturated fats, less than 2101m cholesterol, less than 1.5gm of sodium, & 5 or more servings of fruits and vegetables daily;Weight  Loss: Understanding of general recommendations for a balanced deficit meal plan, which promotes 1-2 lb weight loss per week and includes a negative energy balance of [phone] kcal/d;Understanding recommendations for meals to include 15-35% energy as protein, 25-35% energy from fat, 35-60% energy from carbohydrates, less than 2055mof dietary cholesterol, 20-35 gm of total fiber daily;Understanding of distribution of calorie intake throughout the day with the consumption of 4-5 meals/snacks   Improve shortness of breath with ADL's Yes   Intervention Provide education, individualized exercise plan and daily activity instruction to help decrease symptoms of SOB with activities of daily living.   Expected Outcomes Short Term: Achieves a reduction of symptoms when performing activities of daily living.   Diabetes Yes   Intervention Provide education about signs/symptoms and action to take for hypo/hyperglycemia.;Provide education about proper nutrition, including hydration, and aerobic/resistive exercise prescription along with prescribed medications to achieve blood glucose in normal ranges: Fasting glucose 65-99 mg/dL   Expected Outcomes Short Term: Participant verbalizes understanding of the signs/symptoms and immediate care of hyper/hypoglycemia, proper foot care and importance of medication, aerobic/resistive exercise and nutrition plan for blood glucose control.;Long Term: Attainment of HbA1C < 7%.   Heart Failure Yes   Intervention Provide a combined exercise and nutrition program that is supplemented with education, support and counseling about heart failure. Directed toward relieving symptoms such as shortness of breath, decreased exercise tolerance, and extremity edema.   Expected Outcomes Improve functional capacity of life;Short term: Attendance in program 2-3 days  a week with increased exercise capacity. Reported lower sodium intake. Reported increased fruit and vegetable intake. Reports medication  compliance.;Short term: Daily weights obtained and reported for increase. Utilizing diuretic protocols set by physician.;Long term: Adoption of self-care skills and reduction of barriers for early signs and symptoms recognition and intervention leading to self-care maintenance.   Hypertension Yes   Intervention Provide education on lifestyle modifcations including regular physical activity/exercise, weight management, moderate sodium restriction and increased consumption of fresh fruit, vegetables, and low fat dairy, alcohol moderation, and smoking cessation.;Monitor prescription use compliance.   Expected Outcomes Short Term: Continued assessment and intervention until BP is < 140/17m HG in hypertensive participants. < 130/857mHG in hypertensive participants with diabetes, heart failure or chronic kidney disease.;Long Term: Maintenance of blood pressure at goal levels.   Lipids Yes   Intervention Provide education and support for participant on nutrition & aerobic/resistive exercise along with prescribed medications to achieve LDL <7054mHDL >43m43m Expected Outcomes Short Term: Participant states understanding of desired cholesterol values and is compliant with medications prescribed. Participant is following exercise prescription and nutrition guidelines.;Long Term: Cholesterol controlled with medications as prescribed, with individualized exercise RX and with personalized nutrition plan. Value goals: LDL < 70mg72mL > 40 mg.   Stress Yes   Intervention Offer individual and/or small group education and counseling on adjustment to heart disease, stress management and health-related lifestyle change. Teach and support self-help strategies.;Refer participants experiencing significant psychosocial distress to appropriate mental health specialists for further evaluation and treatment. When possible, include family members and significant others in education/counseling sessions.   Expected Outcomes Short  Term: Participant demonstrates changes in health-related behavior, relaxation and other stress management skills, ability to obtain effective social support, and compliance with psychotropic medications if prescribed.;Long Term: Emotional wellbeing is indicated by absence of clinically significant psychosocial distress or social isolation.      Core Components/Risk Factors/Patient Goals Review:    Core Components/Risk Factors/Patient Goals at Discharge (Final Review):    ITP Comments:     ITP Comments    Row Name 07/22/16 0826           ITP Comments Dr. TraciFransico Himical Director          Comments: Pt is making expected progress toward personal goals after completing 2 sessions. Recommend continued exercise and life style modification education including  stress management and relaxation techniques to decrease cardiac risk profile.

## 2016-07-30 ENCOUNTER — Encounter (HOSPITAL_COMMUNITY)
Admission: RE | Admit: 2016-07-30 | Discharge: 2016-07-30 | Disposition: A | Discharge status: Home / Self Care | Payer: 59 | Source: Ambulatory Visit | Attending: Internal Medicine | Admitting: Internal Medicine

## 2016-07-30 DIAGNOSIS — I5022 Chronic systolic (congestive) heart failure: Secondary | ICD-10-CM

## 2016-07-30 LAB — GLUCOSE, CAPILLARY
GLUCOSE-CAPILLARY: 133 mg/dL — AB (ref 65–99)
Glucose-Capillary: 127 mg/dL — ABNORMAL HIGH (ref 65–99)

## 2016-08-01 ENCOUNTER — Encounter (HOSPITAL_COMMUNITY)
Admission: RE | Admit: 2016-08-01 | Discharge: 2016-08-01 | Disposition: A | Discharge status: Home / Self Care | Payer: 59 | Source: Ambulatory Visit | Attending: Internal Medicine | Admitting: Internal Medicine

## 2016-08-01 DIAGNOSIS — I5022 Chronic systolic (congestive) heart failure: Secondary | ICD-10-CM | POA: Diagnosis not present

## 2016-08-01 LAB — GLUCOSE, CAPILLARY
GLUCOSE-CAPILLARY: 186 mg/dL — AB (ref 65–99)
Glucose-Capillary: 163 mg/dL — ABNORMAL HIGH (ref 65–99)

## 2016-08-04 ENCOUNTER — Encounter (HOSPITAL_COMMUNITY): Payer: 59

## 2016-08-06 ENCOUNTER — Encounter (HOSPITAL_COMMUNITY)
Admission: RE | Admit: 2016-08-06 | Discharge: 2016-08-06 | Disposition: A | Discharge status: Home / Self Care | Payer: 59 | Source: Ambulatory Visit | Attending: Internal Medicine | Admitting: Internal Medicine

## 2016-08-06 DIAGNOSIS — I5022 Chronic systolic (congestive) heart failure: Secondary | ICD-10-CM | POA: Diagnosis not present

## 2016-08-06 NOTE — Progress Notes (Signed)
Christopher Roth 46 y.o. male Nutrition Note Spoke with pt. Nutrition Plan and Nutrition Survey goals reviewed with pt. Pt is following Step 1 of the Therapeutic Lifestyle Changes diet. Pt is diabetic. Last A1c of 7.2 on 07/31/16 indicates blood glucose not optimally controlled. Pt reports his MD added a new DM medication (Jardiance), which pt started on Friday 08/01/16. Pt checks CBG's 3 times a day. Fasting CBG's reportedly 80-150 mg/dL; pt states most CBG's range from 80-124 mg/dL. Pt with dx of CHF. Per discussion, pt is watching his sodium intake by looking at labels. Pt seasoning his food with Accent and No Salt. Pt encouraged to stop using salt substitutes with potassium due to HTN medications. Pt expressed understanding of the information reviewed. Pt aware of nutrition education classes offered.  Wt Readings from Last 3 Encounters:  07/22/16 214 lb 4.6 oz (97.2 kg)  07/09/16 217 lb 12.8 oz (98.8 kg)  06/26/16 216 lb 12 oz (98.3 kg)   Nutrition Diagnosis ? Food-and nutrition-related knowledge deficit related to lack of exposure to information as related to diagnosis of: ? CHF ? DM ? Obesity related to excessive energy intake as evidenced by a BMI of 32.4  Nutrition Intervention ? Pt's individual nutrition plan reviewed with pt. ? Benefits of adopting Therapeutic Lifestyle Changes discussed when Medficts reviewed. ? Pt to attend the Portion Distortion class ? Pt to attend the Diabetes Q & A class ? Pt to attend the   ? Nutrition I class                  ? Nutrition II class     ? Diabetes Blitz class ? Continue client-centered nutrition education by RD, as part of interdisciplinary care. Goal(s) ? Pt to identify and limit food sources of saturated fat, trans fat, and sodium ? Pt to identify food quantities necessary to achieve weight loss of 6-24 lb (2.7-10.9 kg) at graduation from cardiac rehab.  ? CBG concentrations in the normal range or as close to normal as is safely  possible. Monitor and Evaluate progress toward nutrition goal with team. Derek Mound, M.Ed, RD, LDN, CDE 08/06/2016 7:58 AM

## 2016-08-07 ENCOUNTER — Encounter (HOSPITAL_COMMUNITY): Payer: Self-pay | Admitting: Internal Medicine

## 2016-08-07 ENCOUNTER — Ambulatory Visit (HOSPITAL_COMMUNITY)
Admission: RE | Admit: 2016-08-07 | Discharge: 2016-08-07 | Disposition: A | Discharge status: Home / Self Care | Payer: 59 | Source: Ambulatory Visit | Attending: Internal Medicine | Admitting: Internal Medicine

## 2016-08-07 VITALS — BP 148/90 | HR 60 | Wt 212.5 lb

## 2016-08-07 DIAGNOSIS — Z79899 Other long term (current) drug therapy: Secondary | ICD-10-CM | POA: Insufficient documentation

## 2016-08-07 DIAGNOSIS — I10 Essential (primary) hypertension: Secondary | ICD-10-CM | POA: Diagnosis not present

## 2016-08-07 DIAGNOSIS — I5042 Chronic combined systolic (congestive) and diastolic (congestive) heart failure: Secondary | ICD-10-CM | POA: Diagnosis present

## 2016-08-07 DIAGNOSIS — I255 Ischemic cardiomyopathy: Secondary | ICD-10-CM | POA: Insufficient documentation

## 2016-08-07 DIAGNOSIS — Z87891 Personal history of nicotine dependence: Secondary | ICD-10-CM | POA: Diagnosis not present

## 2016-08-07 DIAGNOSIS — Z7982 Long term (current) use of aspirin: Secondary | ICD-10-CM | POA: Insufficient documentation

## 2016-08-07 DIAGNOSIS — Z8249 Family history of ischemic heart disease and other diseases of the circulatory system: Secondary | ICD-10-CM | POA: Insufficient documentation

## 2016-08-07 DIAGNOSIS — E119 Type 2 diabetes mellitus without complications: Secondary | ICD-10-CM | POA: Insufficient documentation

## 2016-08-07 DIAGNOSIS — I110 Hypertensive heart disease with heart failure: Secondary | ICD-10-CM | POA: Diagnosis not present

## 2016-08-07 DIAGNOSIS — I2510 Atherosclerotic heart disease of native coronary artery without angina pectoris: Secondary | ICD-10-CM | POA: Diagnosis not present

## 2016-08-07 DIAGNOSIS — Z7984 Long term (current) use of oral hypoglycemic drugs: Secondary | ICD-10-CM | POA: Insufficient documentation

## 2016-08-07 DIAGNOSIS — I5022 Chronic systolic (congestive) heart failure: Secondary | ICD-10-CM | POA: Diagnosis not present

## 2016-08-07 NOTE — Progress Notes (Signed)
Advanced Heart Failure Clinic Note   Referring Physician: Crenshaw Primary Care: [name], MD at [facility] family Primary Cardiologist: Dr Stanford Breed HF: Dr. [name]  HPI:  Georg Ang is a 46 y.o. male with PMH of HTN, DM2, Tobacco abuse, CAD, and systolic CHF.   Admitted 04/20/16 -> 04/25/16 with new onest HF symptoms. Echo 04/21/16 LVEF 25-30%, Grade 2 DD. Diuresed 12 lbs. LHC with significant CAD as below.   He returns to HF follow up. Recently cMRI EF 39%. At last visit with PharmDt, Entresto was titrated up to 97/103. LifeVest removed. Feels pretty good. Breathing better. Continue to go to [facility]. Walking 3.2 mph at 2%. No CP. No orthpnea or PND. He has quit smoking and has not had any alcohol since Dec. 2017. Weight has been stable at home around 206. No palpitations or presyncope. Takes BP at home 120/80. 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  Social: Lives in [place], Wife and Son  Family history: Brother, 33, has pacemaker -> strokes and vascular disease Mother: HTN, Diabetes Father: Diabetes  cMRI 07/16/16: EF 39% scar in LAD distribution. No viability   LHC 04/23/16 - Recent total occlusion of the proximal to mid LAD.  - 70% ramus intermedius, 65% ostial circumflex followed by 70% mid circumflex, 75% proximal LAD prior to the origin of a small to moderate size diagonal, and 90% mid PDA. - Mild PAH, PCWP 14 - LVEF 10-15% Hemodynamics RHC Procedural Findings: Hemodynamics (mmHg) RA mean 4 RV 39/1 PA 37/12 PCWP 14 AO 115/78 Cardiac Output (Fick) 5.63 Cardiac Index (Fick) 2.71   Past Medical History:  Diagnosis Date  . Acute systolic congestive heart failure ([place]) 04/22/2016  . Diabetes mellitus without complication ([place])   . Hypertension   . Tobacco use disorder 11/21/2014    Current Outpatient Prescriptions  Medication Sig Dispense Refill  . albuterol (PROVENTIL HFA;VENTOLIN HFA) 108 (90 Base) MCG/ACT inhaler  Inhale 2 puffs into the lungs every 6 (six) hours as needed for wheezing or shortness of breath.    . Alcohol Swabs PADS 1 Package by Does not apply route as needed (with blood glucose checks). 1 each 0  . aspirin 325 MG tablet Take 1 tablet (325 mg total) by mouth daily. 30 tablet 0  . atorvastatin (LIPITOR) 80 MG tablet Take 1 tablet (80 mg total) by mouth daily at 6 PM. 30 tablet 5  . carvedilol (COREG) 6.25 MG tablet Take 1 tablet (6.25 mg total) by mouth 2 (two) times daily with a meal. 60 tablet 5  . empagliflozin (JARDIANCE) 10 MG TABS tablet Take by mouth.    Marland Kitchen glipiZIDE (GLUCOTROL XL) 10 MG 24 hr tablet Take 20 mg by mouth daily with breakfast.     . glucose blood (CHOICE DM FORA G20 TEST STRIPS) test strip Use as instructed 100 each 11  . glucose monitoring kit (FREESTYLE) monitoring kit 1 each by Does not apply route as needed for other. 1 each 0  . isosorbide-hydrALAZINE (BIDIL) 20-37.5 MG tablet Take 1 tablet by mouth 3 (three) times daily. 90 tablet 11  . Lancets (FREESTYLE) lancets Use as instructed 100 each 11  . metFORMIN (GLUCOPHAGE-XR) 500 MG 24 hr tablet Take 1,000 mg by mouth 2 (two) times daily.    Marland Kitchen OVER THE COUNTER MEDICATION Take 1 tablet by mouth daily. Michael's blood pressure tablets    . sacubitril-valsartan (ENTRESTO) 97-103 MG Take 1 tablet by mouth 2 (two) times daily. [place]  tablet 3  . spironolactone (ALDACTONE) 25 MG tablet Take 0.5 tablets (12.5 mg total) by mouth daily. 15 tablet 5  . furosemide (LASIX) 20 MG tablet Take 1 tablet (20 mg total) by mouth as needed. For weight gain of 2 lbs or more in 24 hours (Patient not taking: Reported on 08/07/2016) 15 tablet 11   No current facility-administered medications for this encounter.     Allergies  Allergen Reactions  . Januvia [Sitagliptin] Hives     Social History   Social History  . Marital status: Married    Spouse name: N/A  . Number of children: N/A  . Years of education: N/A   Occupational History    . Not on file.   Social History Main Topics  . Smoking status: Former Smoker    Packs/day: 1.00    Years: 24.00    Types: Cigarettes    Quit date: 03/27/2016  . Smokeless tobacco: Never Used  . Alcohol use Yes  . Drug use: No  . Sexual activity: Not on file   Other Topics Concern  . Not on file   Social History Narrative  . No narrative on file     Family History  Problem Relation Age of Onset  . Diabetes Mellitus II Mother     Vitals:   08/07/16 1127  BP: (!) 148/90  Pulse: 60  SpO2: 99%  Weight: 212 lb 8 oz (96.4 kg)   Wt Readings from Last 3 Encounters:  08/07/16 212 lb 8 oz (96.4 kg)  07/22/16 214 lb 4.6 oz (97.2 kg)  07/09/16 217 lb 12.8 oz (98.8 kg)    PHYSICAL EXAM: General:  Well appearing. NAD. Here with wife HEENT: Normal  anicteric Neck: Supple. JVP 5-6. Carotids 2+ bilat; no bruits. No lymphadenopathy or thyromegaly appreciated. Cor: PMI normal RRR. No m/r/g Lungs: CTA no wheeze Abdomen: Soft, NT, nondistended, no HSM. No bruits or masses. Good bowel sounds  Extremities: No cyanosis, clubbing, rash. No peripheral edema. Poison ivy on forearms   Neuro: Alert & oriented x 3, cranial nerves grossly intact. moves all 4 extremities w/o difficulty. Affect pleasant.  ASSESSMENT & PLAN:  1. Chronic combined systolic and diastolic CHF, NYHA II : iCM with Echo 04/2016 LVEF 25-30%, Grade 2 DD (EF 10-15% by cath) - cMRI reviewed personally EF 39%. Anterior scar with no significan viability - Volume status looks good on exam. NYHA II - Continue  Lasix to 61m prn.  - Continue Coreg 6.25 mg BID. HR too low to increase.  - Continue Entresto to 97/1019mBID.  - Continue Spiro 25105maily.  - With EF > 35% no need for ICD at this point. Can repeat echo in 3 months to confirm.  - Can consider adding Bidil in the future.  2. [place] - No ischemic symptoms - Continue ASA and statin 3. HTN - BP elevated here (I checked it myself) but well controlled  at CR. Will have him keep daily BP log for one month and re-evaluate.  4. DMII - Per PCP 5. Tobacco abuse - Has stopped smoking since Dec. 2017   Ashlay Altieri,MD 12:11 PM

## 2016-08-07 NOTE — Patient Instructions (Signed)
Please keep a blood pressure log and bring it with you to your next appointment.   Follow up with Dr.Bensimhon in 6 months

## 2016-08-08 ENCOUNTER — Encounter (HOSPITAL_COMMUNITY)
Admission: RE | Admit: 2016-08-08 | Discharge: 2016-08-08 | Disposition: A | Discharge status: Home / Self Care | Payer: 59 | Source: Ambulatory Visit | Attending: Internal Medicine | Admitting: Internal Medicine

## 2016-08-08 DIAGNOSIS — I5022 Chronic systolic (congestive) heart failure: Secondary | ICD-10-CM | POA: Diagnosis not present

## 2016-08-08 LAB — GLUCOSE, CAPILLARY
GLUCOSE-CAPILLARY: 153 mg/dL — AB (ref 65–99)
Glucose-Capillary: 183 mg/dL — ABNORMAL HIGH (ref 65–99)

## 2016-08-11 ENCOUNTER — Encounter (HOSPITAL_COMMUNITY)
Admission: RE | Admit: 2016-08-11 | Discharge: 2016-08-11 | Disposition: A | Discharge status: Home / Self Care | Payer: 59 | Source: Ambulatory Visit | Attending: Internal Medicine | Admitting: Internal Medicine

## 2016-08-11 DIAGNOSIS — I5022 Chronic systolic (congestive) heart failure: Secondary | ICD-10-CM

## 2016-08-11 LAB — GLUCOSE, CAPILLARY: GLUCOSE-CAPILLARY: 178 mg/dL — AB (ref 65–99)

## 2016-08-11 NOTE — Progress Notes (Signed)
Nutrition Note Spoke with pt. Pt fasting CBG 122 mg/dL this morning. Pt ate an orange and post-exercise CBG was 178 mg/dL. Pt encouraged to eat protein and carbs before exercise. Pt planning on going home and eating breakfast. Pt expressed understanding of the information reviewed. Continue client-centered nutrition education by RD as part of interdisciplinary care.  Monitor and evaluate progress toward nutrition goal with team.  Derek Mound, M.Ed, RD, LDN, CDE 08/11/2016 8:16 AM

## 2016-08-13 ENCOUNTER — Encounter (HOSPITAL_COMMUNITY)
Admission: RE | Admit: 2016-08-13 | Discharge: 2016-08-13 | Disposition: A | Discharge status: Home / Self Care | Payer: 59 | Source: Ambulatory Visit | Attending: Internal Medicine | Admitting: Internal Medicine

## 2016-08-13 DIAGNOSIS — I5022 Chronic systolic (congestive) heart failure: Secondary | ICD-10-CM

## 2016-08-15 ENCOUNTER — Encounter (HOSPITAL_COMMUNITY)
Admission: RE | Admit: 2016-08-15 | Discharge: 2016-08-15 | Disposition: A | Discharge status: Home / Self Care | Payer: 59 | Source: Ambulatory Visit | Attending: Internal Medicine | Admitting: Internal Medicine

## 2016-08-15 VITALS — Wt 209.7 lb

## 2016-08-15 DIAGNOSIS — I5022 Chronic systolic (congestive) heart failure: Secondary | ICD-10-CM

## 2016-08-15 NOTE — Progress Notes (Signed)
Reviewed home exercise program with pt.  Discussed THRR, RPE scale, mode/frequency of exercise and weather conditions for exercising outdoors.  Also discussed signs and symptoms and when to call Dr./911.  Pt verbalized understanding.   Cleda Mccreedy 08/15/2016 16:27

## 2016-08-18 ENCOUNTER — Encounter (HOSPITAL_COMMUNITY): Payer: 59

## 2016-08-19 ENCOUNTER — Telehealth (HOSPITAL_COMMUNITY): Payer: Self-pay | Admitting: Cardiac Rehabilitation

## 2016-08-19 NOTE — Telephone Encounter (Signed)
pc received from pt he will be absent from cardiac rehab due to allergic rash being treated with prednisone by his PCP. Pt instructed to avoid group exercise if rash draining or open.  Pt also instructed to take extra blood glucose monitoring precautions due to steroid dosage.   Pt verbalized understanding.

## 2016-08-20 ENCOUNTER — Encounter (HOSPITAL_COMMUNITY)
Admission: RE | Admit: 2016-08-20 | Discharge: 2016-08-20 | Disposition: A | Discharge status: Home / Self Care | Payer: 59 | Source: Ambulatory Visit | Attending: Internal Medicine | Admitting: Internal Medicine

## 2016-08-20 ENCOUNTER — Encounter (HOSPITAL_COMMUNITY): Payer: Self-pay | Admitting: *Deleted

## 2016-08-20 ENCOUNTER — Telehealth (HOSPITAL_COMMUNITY): Payer: Self-pay | Admitting: *Deleted

## 2016-08-20 NOTE — Telephone Encounter (Signed)
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  Letter completed and patient is requested to pick it up.    Letter placed in envelope for patient to pick up today.

## 2016-08-22 ENCOUNTER — Encounter (HOSPITAL_COMMUNITY): Admission: RE | Admit: 2016-08-22 | Payer: 59 | Source: Ambulatory Visit

## 2016-08-25 ENCOUNTER — Encounter (HOSPITAL_COMMUNITY)
Admission: RE | Admit: 2016-08-25 | Discharge: 2016-08-25 | Disposition: A | Discharge status: Home / Self Care | Payer: 59 | Source: Ambulatory Visit | Attending: Internal Medicine | Admitting: Internal Medicine

## 2016-08-27 ENCOUNTER — Encounter (HOSPITAL_COMMUNITY): Payer: Self-pay

## 2016-08-27 ENCOUNTER — Encounter (HOSPITAL_COMMUNITY): Payer: 59

## 2016-08-27 NOTE — Progress Notes (Signed)
Cardiac Individual Treatment Plan  Patient Details  Name: Christopher Roth MRN: [id number] Date of Birth: 10-Jul-1970 Referring Provider:     CARDIAC REHAB PHASE II ORIENTATION from 07/22/2016 in [place]  Referring Provider  Glori Bickers MD      Initial Encounter Date:    CARDIAC REHAB PHASE II ORIENTATION from 07/22/2016 in [place]  Date  07/22/16  Referring Provider  Glori Bickers MD      Visit Diagnosis: Heart failure, chronic systolic ([place])  Patient's Home Medications on Admission:  Current Outpatient Prescriptions:  .  albuterol (PROVENTIL HFA;VENTOLIN HFA) 108 (90 Base) MCG/ACT inhaler, Inhale 2 puffs into the lungs every 6 (six) hours as needed for wheezing or shortness of breath., Disp: , Rfl:  .  Alcohol Swabs PADS, 1 Package by Does not apply route as needed (with blood glucose checks)., Disp: 1 each, Rfl: 0 .  aspirin 325 MG tablet, Take 1 tablet (325 mg total) by mouth daily., Disp: 30 tablet, Rfl: 0 .  atorvastatin (LIPITOR) 80 MG tablet, Take 1 tablet (80 mg total) by mouth daily at 6 PM., Disp: 30 tablet, Rfl: 5 .  carvedilol (COREG) 6.25 MG tablet, Take 1 tablet (6.25 mg total) by mouth 2 (two) times daily with a meal., Disp: 60 tablet, Rfl: 5 .  empagliflozin (JARDIANCE) 10 MG TABS tablet, Take by mouth., Disp: , Rfl:  .  furosemide (LASIX) 20 MG tablet, Take 1 tablet (20 mg total) by mouth as needed. For weight gain of 2 lbs or more in 24 hours (Patient not taking: Reported on 08/07/2016), Disp: 15 tablet, Rfl: 11 .  glipiZIDE (GLUCOTROL XL) 10 MG 24 hr tablet, Take 20 mg by mouth daily with breakfast. , Disp: , Rfl:  .  glucose blood (CHOICE DM FORA G20 TEST STRIPS) test strip, Use as instructed, Disp: 100 each, Rfl: 11 .  glucose monitoring kit (FREESTYLE) monitoring kit, 1 each by Does not apply route as needed for other., Disp: 1 each, Rfl: 0 .  isosorbide-hydrALAZINE (BIDIL) 20-37.5 MG tablet,  Take 1 tablet by mouth 3 (three) times daily., Disp: 90 tablet, Rfl: 11 .  Lancets (FREESTYLE) lancets, Use as instructed, Disp: 100 each, Rfl: 11 .  metFORMIN (GLUCOPHAGE-XR) 500 MG 24 hr tablet, Take 1,000 mg by mouth 2 (two) times daily., Disp: , Rfl:  .  OVER THE COUNTER MEDICATION, Take 1 tablet by mouth daily. Michael's blood pressure tablets, Disp: , Rfl:  .  sacubitril-valsartan (ENTRESTO) 97-103 MG, Take 1 tablet by mouth 2 (two) times daily., Disp: 60 tablet, Rfl: 3 .  spironolactone (ALDACTONE) 25 MG tablet, Take 0.5 tablets (12.5 mg total) by mouth daily., Disp: 15 tablet, Rfl: 5  Past Medical History: Past Medical History:  Diagnosis Date  . Acute systolic congestive heart failure ([place]) 04/22/2016  . Diabetes mellitus without complication ([place])   . Hypertension   . Tobacco use disorder 11/21/2014    Tobacco Use: History  Smoking Status  . Former Smoker  . Packs/day: 1.00  . Years: 24.00  . Types: Cigarettes  . Quit date: 03/27/2016  Smokeless Tobacco  . Never Used    Labs: Recent Review Flowsheet Data    Labs for ITP Cardiac and Pulmonary Rehab Latest Ref Rng & Units 11/17/2014 04/22/2016 04/23/2016 04/23/2016 04/23/2016   Cholestrol 0 - 200 mg/dL - 193 - - -   LDLCALC 0 - 99 mg/dL - 125(H) - - -   LDLDIRECT mg/dL - - - - -  HDL >40 mg/dL - 52 - - -   Trlycerides <150 mg/dL - 80 - - -   Hemoglobin A1c 4.8 - 5.6 % 12.3(H) - - - -   PHART 7.350 - 7.450 - - - 7.386 7.400   PCO2ART 32.0 - 48.0 mmHg - - - 41.7 39.8   HCO3 20.0 - 28.0 mmol/L - - 27.2 25.0 24.7   TCO2 0 - 100 mmol/L - - _0 O2SAT % - - 65.0 97.0 97.0      Capillary Blood Glucose: Lab Results  Component Value Date   GLUCAP 178 (H) 08/11/2016   GLUCAP 183 (H) 08/08/2016   GLUCAP 153 (H) 08/08/2016   GLUCAP 163 (H) 08/01/2016   GLUCAP 186 (H) 08/01/2016     Exercise Target Goals:    Exercise Program Goal: Individual exercise prescription set with THRR, safety & activity barriers.  Participant demonstrates ability to understand and report RPE using BORG scale, to self-measure pulse accurately, and to acknowledge the importance of the exercise prescription.  Exercise Prescription Goal: Starting with aerobic activity 30 plus minutes a day, 3 days per week for initial exercise prescription. Provide home exercise prescription and guidelines that participant acknowledges understanding prior to discharge.  Activity Barriers & Risk Stratification:     Activity Barriers & Cardiac Risk Stratification - 07/22/16 0825      Activity Barriers & Cardiac Risk Stratification   Activity Barriers None   Cardiac Risk Stratification High      6 Minute Walk:     6 Minute Walk    Row Name 07/22/16 1128         6 Minute Walk   Phase Initial     Distance 1575 feet     Walk Time 6 minutes     # of Rest Breaks 0     MPH 2.98     METS 4.53     RPE 11     VO2 Peak 15.85     Symptoms No     Resting HR 71 bpm     Resting BP 114/72     Max Ex. HR 105 bpm     Max Ex. BP 132/78     2 Minute Post BP 118/72        Oxygen Initial Assessment:   Oxygen Re-Evaluation:   Oxygen Discharge (Final Oxygen Re-Evaluation):   Initial Exercise Prescription:     Initial Exercise Prescription - 07/22/16 1100      Date of Initial Exercise RX and Referring Provider   Date 07/22/16   Referring Provider Glori Bickers MD     Treadmill   MPH 2.9   Grade 1   Minutes 10   METs 3.62     Bike   Level 1   Minutes 10   METs 2.99     NuStep   Level 3   SPM 100   Minutes 10   METs 2.4     Prescription Details   Frequency (times per week) 3   Duration Progress to 30 minutes of continuous aerobic without signs/symptoms of physical distress     Intensity   THRR 40-80% of Max Heartrate 70-140   Ratings of Perceived Exertion 11-13   Perceived Dyspnea 0-4     Progression   Progression Continue to progress workloads to maintain intensity without signs/symptoms of physical  distress.     Resistance Training   Training Prescription Yes   Weight 3lbs   Reps 10-15  Perform Capillary Blood Glucose checks as needed.  Exercise Prescription Changes:     Exercise Prescription Changes    Row Name 08/18/16 1400             Response to Exercise   Blood Pressure (Admit) 114/80       Blood Pressure (Exercise) 148/80       Blood Pressure (Exit) 96/70       Heart Rate (Admit) 89 bpm       Heart Rate (Exercise) 121 bpm       Heart Rate (Exit) 83 bpm       Rating of Perceived Exertion (Exercise) 10       Duration Progress to 45 minutes of aerobic exercise without signs/symptoms of physical distress       Intensity THRR unchanged         Progression   Progression Continue to progress workloads to maintain intensity without signs/symptoms of physical distress.       Average METs 4.1         Resistance Training   Training Prescription Yes       Weight 8lb       Reps 10-15         Treadmill   MPH 3.3       Grade 3       Minutes 10       METs 4.89         Bike   Level 1.2       Minutes 10       METs 3.38         NuStep   Level 5       SPM 100       Minutes 10       METs 3.9         Home Exercise Plan   Plans to continue exercise at Home (comment)       Frequency Add 4 additional days to program exercise sessions.       Initial Home Exercises Provided 08/15/16          Exercise Comments:     Exercise Comments    Row Name 07/28/16 1623 08/22/16 1556         Exercise Comments Off to a great start with exercise! pt is doing well with exercise, but he has been absent for the past week due to a rash/allergic reaction.  Hopefully he can return next week         Exercise Goals and Review:     Exercise Goals    Row Name 07/22/16 0825 07/22/16 1151           Exercise Goals   Increase Physical Activity Yes  -      Intervention Provide advice, education, support and counseling about physical activity/exercise needs.;Develop an  individualized exercise prescription for aerobic and resistive training based on initial evaluation findings, risk stratification, comorbidities and participant's personal goals.  -      Expected Outcomes Achievement of increased cardiorespiratory fitness and enhanced flexibility, muscular endurance and strength shown through measurements of functional capacity and personal statement of participant.  -      Increase Strength and Stamina Yes  -      Intervention  - Develop an individualized exercise prescription for aerobic and resistive training based on initial evaluation findings, risk stratification, comorbidities and participant's personal goals.;Provide advice, education, support and counseling about physical activity/exercise needs.      Expected Outcomes  -  Achievement of increased cardiorespiratory fitness and enhanced flexibility, muscular endurance and strength shown through measurements of functional capacity and personal statement of participant.         Exercise Goals Re-Evaluation :    Discharge Exercise Prescription (Final Exercise Prescription Changes):     Exercise Prescription Changes - 08/18/16 1400      Response to Exercise   Blood Pressure (Admit) 114/80   Blood Pressure (Exercise) 148/80   Blood Pressure (Exit) 96/70   Heart Rate (Admit) 89 bpm   Heart Rate (Exercise) 121 bpm   Heart Rate (Exit) 83 bpm   Rating of Perceived Exertion (Exercise) 10   Duration Progress to 45 minutes of aerobic exercise without signs/symptoms of physical distress   Intensity THRR unchanged     Progression   Progression Continue to progress workloads to maintain intensity without signs/symptoms of physical distress.   Average METs 4.1     Resistance Training   Training Prescription Yes   Weight 8lb   Reps 10-15     Treadmill   MPH 3.3   Grade 3   Minutes 10   METs 4.89     Bike   Level 1.2   Minutes 10   METs 3.38     NuStep   Level 5   SPM 100   Minutes 10   METs  3.9     Home Exercise Plan   Plans to continue exercise at Home (comment)   Frequency Add 4 additional days to program exercise sessions.   Initial Home Exercises Provided 08/15/16      Nutrition:  Target Goals: Understanding of nutrition guidelines, daily intake of sodium <1548m, cholesterol <2030m calories 30% from fat and 7% or less from saturated fats, daily to have 5 or more servings of fruits and vegetables.  Biometrics:     Pre Biometrics - 07/22/16 1130      Pre Biometrics   Waist Circumference 37.5 inches   Hip Circumference 41.5 inches   Waist to Hip Ratio 0.9 %   Triceps Skinfold 15 mm   % Body Fat 27.2 %   Grip Strength 44 kg   Flexibility 8 in   Single Leg Stand 3 seconds       Nutrition Therapy Plan and Nutrition Goals:     Nutrition Therapy & Goals - 07/23/16 0836      Nutrition Therapy   Diet Carb Modified, Therapeutic Lifestyle Changes     Personal Nutrition Goals   Nutrition Goal Wt loss of 1-2 lb/week to a wt loss goal of 6-24 lb at [facility] and [facility] regarding individualized specific dietary modifications aiming towards targeted core components such as weight, hypertension, lipid management, diabetes, heart failure and other comorbidities.   Expected Outcomes Short Term Goal: Understand basic principles of dietary content, such as calories, fat, sodium, cholesterol and nutrients.;Long Term Goal: Adherence to prescribed nutrition plan.      Nutrition Discharge: Nutrition Scores:     Nutrition Assessments - 08/06/16 0819      MEDFICTS Scores   Pre Score 65      Nutrition Goals Re-Evaluation:   Nutrition Goals Re-Evaluation:   Nutrition Goals Discharge (Final Nutrition Goals Re-Evaluation):   Psychosocial: Target Goals: Acknowledge presence or absence of significant depression and/or stress, maximize coping skills, provide positive support system.  Participant is able to verbalize types and ability to use techniques and skills needed for reducing stress  and depression.  Initial Review & Psychosocial Screening:     Initial Psych Review & Screening - 07/22/16 1709      Initial Review   Current issues with Current Stress Concerns;Current Anxiety/Panic   Source of Stress Concerns Chronic Illness;Unable to perform yard/household activities;Unable to participate in former interests or hobbies     [place]? Yes  wife, children, grandchildren      Barriers   Psychosocial barriers to participate in program The patient should benefit from training in stress management and relaxation.     Screening Interventions   Interventions Encouraged to exercise;To provide support and resources with identified psychosocial needs;Provide feedback about the scores to participant      Quality of Life Scores:     Quality of Life - 07/28/16 1039      Quality of Life Scores   Health/Function Pre --  pt scores reflect health related anxiety. pt reports dyspnea with exposure to cleaning products.  pt encouraged to wear a mask and good ventilation when using cleaning products.     Socioeconomic Pre --  pt concerns are current inability to return to work.  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  pt instructed return to work clearance is determined by Physicians.  Voc Rehab packet given.      PHQ-9: Recent Review Flowsheet Data    Depression screen [facility] 2/9 07/29/2016   Decreased Interest 0   Down, Depressed, Hopeless 0   PHQ - 2 Score 0     Interpretation of Total Score  Total Score Depression Severity:  1-4 = Minimal depression, 5-9 = Mild depression, 10-14 = Moderate depression, 15-19 = Moderately severe depression, 20-27 = Severe depression   Psychosocial Evaluation and Intervention:     Psychosocial Evaluation - 07/28/16 1036      Psychosocial Evaluation & Interventions    Interventions Encouraged to exercise with the program and follow exercise prescription;Stress management education;Relaxation education   Comments pt exhibits health related anxiety. pt encouraged to exercise and participate in lifestyle  modification education classes.     Expected Outcomes pt will exhibit hopeful, positive attitude with good coping skills.    Continue Psychosocial Services  Follow up required by staff      Psychosocial Re-Evaluation:     Psychosocial Re-Evaluation    [place] Name 08/22/16 1052 08/27/16 1101           Psychosocial Re-Evaluation   Current issues with Current Anxiety/Panic;Current Stress Concerns Current Anxiety/Panic;Current Stress Concerns      Comments  - pt continued health related stress and anxiety.  pt currently out on medical leave for allergic dermatitis.  pt hopes to return to program soon.  pt is exercising on his own at home and is maintaining contact with Cardiology and PCP for CHF and DM management during prednisone use       Expected Outcomes  - pt will demonstrate positive outlook with good coping skills       Interventions  - Encouraged to attend Cardiac Rehabilitation for the exercise;Stress management education;Relaxation education      Continue Psychosocial Services   - Follow up required by staff         Psychosocial Discharge (Final Psychosocial Re-Evaluation):     Psychosocial Re-Evaluation - 08/27/16 1101      Psychosocial Re-Evaluation   Current issues with Current Anxiety/Panic;Current Stress Concerns   Comments pt continued health related stress and  anxiety.  pt currently out on medical leave for allergic dermatitis.  pt hopes to return to program soon.  pt is exercising on his own at home and is maintaining contact with Cardiology and PCP for CHF and DM management during prednisone use    Expected Outcomes pt will demonstrate positive outlook with good coping skills    Interventions Encouraged to attend Cardiac Rehabilitation  for the exercise;Stress management education;Relaxation education   Continue Psychosocial Services  Follow up required by staff      Vocational Rehabilitation: Provide vocational rehab assistance to qualifying candidates.   Vocational Rehab Evaluation & Intervention:     Vocational Rehab - 07/28/16 1043      Initial Vocational Rehab Evaluation & Intervention   Assessment shows need for Vocational Rehabilitation Yes   Vocational Rehab Packet given to patient 07/28/16      Education: Education Goals: Education classes will be provided on a weekly basis, covering required topics. Participant will state understanding/return demonstration of topics presented.  Learning Barriers/Preferences:     Learning Barriers/Preferences - 07/22/16 0825      Learning Barriers/Preferences   Learning Barriers Sight   Learning Preferences Skilled Demonstration;Video;Pictoral      Education Topics: Count Your Pulse:  -Group instruction provided by verbal instruction, demonstration, patient participation and written materials to support subject.  Instructors address importance of being able to find your pulse and how to count your pulse when at home without a heart monitor.  Patients get hands on experience counting their pulse with staff help and individually.   Heart Attack, Angina, and Risk Factor Modification:  -Group instruction provided by verbal instruction, video, and written materials to support subject.  Instructors address signs and symptoms of angina and heart attacks.    Also discuss risk factors for heart disease and how to make changes to improve heart health risk factors.   CARDIAC REHAB PHASE II EXERCISE from 08/08/2016 in [place]  Date  07/30/16  Instruction Review Code  2- meets goals/outcomes      Functional Fitness:  -Group instruction provided by verbal instruction, demonstration, patient participation, and written materials to support  subject.  Instructors address safety measures for doing things around the house.  Discuss how to get up and down off the floor, how to pick things up properly, how to safely get out of a chair without assistance, and balance training.   Meditation and Mindfulness:  -Group instruction provided by verbal instruction, patient participation, and written materials to support subject.  Instructor addresses importance of mindfulness and meditation practice to help reduce stress and improve awareness.  Instructor also leads participants through a meditation exercise.    Stretching for Flexibility and Mobility:  -Group instruction provided by verbal instruction, patient participation, and written materials to support subject.  Instructors lead participants through series of stretches that are designed to increase flexibility thus improving mobility.  These stretches are additional exercise for major muscle groups that are typically performed during regular warm up and cool down.   Hands Only CPR Anytime:  -Group instruction provided by verbal instruction, video, patient participation and written materials to support subject.  Instructors co-teach with AHA video for hands only CPR.  Participants get hands on experience with mannequins.   Nutrition I class: Heart Healthy Eating:  -Group instruction provided by PowerPoint slides, verbal discussion, and written materials to support subject matter. The instructor gives an explanation and review of the Therapeutic Lifestyle Changes diet recommendations, which includes  a discussion on lipid goals, dietary fat, sodium, fiber, plant stanol/sterol esters, sugar, and the components of a well-balanced, healthy diet.   Nutrition II class: Lifestyle Skills:  -Group instruction provided by PowerPoint slides, verbal discussion, and written materials to support subject matter. The instructor gives an explanation and review of label reading, grocery shopping for heart  health, heart healthy recipe modifications, and ways to make healthier choices when eating out.   Diabetes Question & Answer:  -Group instruction provided by PowerPoint slides, verbal discussion, and written materials to support subject matter. The instructor gives an explanation and review of diabetes co-morbidities, pre- and post-prandial blood glucose goals, pre-exercise blood glucose goals, signs, symptoms, and treatment of hypoglycemia and hyperglycemia, and foot care basics.   CARDIAC REHAB PHASE II EXERCISE from 08/08/2016 in [place]  Date  08/08/16  Educator  RD  Instruction Review Code  2- meets goals/outcomes      Diabetes Blitz:  -Group instruction provided by PowerPoint slides, verbal discussion, and written materials to support subject matter. The instructor gives an explanation and review of the physiology behind type 1 and type 2 diabetes, diabetes medications and rational behind using different medications, pre- and post-prandial blood glucose recommendations and Hemoglobin A1c goals, diabetes diet, and exercise including blood glucose guidelines for exercising safely.    Portion Distortion:  -Group instruction provided by PowerPoint slides, verbal discussion, written materials, and food models to support subject matter. The instructor gives an explanation of serving size versus portion size, changes in portions sizes over the last 20 years, and what consists of a serving from each food group.   Stress Management:  -Group instruction provided by verbal instruction, video, and written materials to support subject matter.  Instructors review role of stress in heart disease and how to cope with stress positively.     Exercising on Your Own:  -Group instruction provided by verbal instruction, power point, and written materials to support subject.  Instructors discuss benefits of exercise, components of exercise, frequency and intensity of  exercise, and end points for exercise.  Also discuss use of nitroglycerin and activating EMS.  Review options of places to exercise outside of rehab.  Review guidelines for sex with heart disease.   Cardiac Drugs I:  -Group instruction provided by verbal instruction and written materials to support subject.  Instructor reviews cardiac drug classes: antiplatelets, anticoagulants, beta blockers, and statins.  Instructor discusses reasons, side effects, and lifestyle considerations for each drug class.   Cardiac Drugs II:  -Group instruction provided by verbal instruction and written materials to support subject.  Instructor reviews cardiac drug classes: angiotensin converting enzyme inhibitors (ACE-I), angiotensin II receptor blockers (ARBs), nitrates, and calcium channel blockers.  Instructor discusses reasons, side effects, and lifestyle considerations for each drug class.   Anatomy and Physiology of the Circulatory System:  -Group instruction provided by verbal instruction, video, and written materials to support subject.  Reviews functional anatomy of heart, how it relates to various diagnoses, and what role the heart plays in the overall system.   Knowledge Questionnaire Score:     Knowledge Questionnaire Score - 07/22/16 1128      Knowledge Questionnaire Score   Pre Score 21/24      Core Components/Risk Factors/Patient Goals at Admission:     Personal Goals and Risk Factors at Admission - 07/22/16 0826      Core Components/Risk Factors/Patient Goals on Admission    Weight Management Yes;Obesity;Weight Loss   Intervention  Weight Management: Develop a combined nutrition and exercise program designed to reach desired caloric intake, while maintaining appropriate intake of nutrient and fiber, sodium and fats, and appropriate energy expenditure required for the weight goal.;Weight Management: Provide education and appropriate resources to help participant work on and attain dietary  goals.;Weight Management/Obesity: Establish reasonable short term and long term weight goals.;Obesity: Provide education and appropriate resources to help participant work on and attain dietary goals.   Expected Outcomes Short Term: Continue to assess and modify interventions until short term weight is achieved;Weight Maintenance: Understanding of the daily nutrition guidelines, which includes 25-35% calories from fat, 7% or less cal from saturated fats, less than 271m cholesterol, less than 1.5gm of sodium, & 5 or more servings of fruits and vegetables daily;Weight Loss: Understanding of general recommendations for a balanced deficit meal plan, which promotes 1-2 lb weight loss per week and includes a negative energy balance of [id number] kcal/d;Understanding recommendations for meals to include 15-35% energy as protein, 25-35% energy from fat, 35-60% energy from carbohydrates, less than 2013mof dietary cholesterol, 20-35 gm of total fiber daily;Understanding of distribution of calorie intake throughout the day with the consumption of 4-5 meals/snacks   Improve shortness of breath with ADL's Yes   Intervention Provide education, individualized exercise plan and daily activity instruction to help decrease symptoms of SOB with activities of daily living.   Expected Outcomes Short Term: Achieves a reduction of symptoms when performing activities of daily living.   Diabetes Yes   Intervention Provide education about signs/symptoms and action to take for hypo/hyperglycemia.;Provide education about proper nutrition, including hydration, and aerobic/resistive exercise prescription along with prescribed medications to achieve blood glucose in normal ranges: Fasting glucose 65-99 mg/dL   Expected Outcomes Short Term: Participant verbalizes understanding of the signs/symptoms and immediate care of hyper/hypoglycemia, proper foot care and importance of medication, aerobic/resistive exercise and nutrition plan for blood  glucose control.;Long Term: Attainment of HbA1C < 7%.   Heart Failure Yes   Intervention Provide a combined exercise and nutrition program that is supplemented with education, support and counseling about heart failure. Directed toward relieving symptoms such as shortness of breath, decreased exercise tolerance, and extremity edema.   Expected Outcomes Improve functional capacity of life;Short term: Attendance in program 2-3 days a week with increased exercise capacity. Reported lower sodium intake. Reported increased fruit and vegetable intake. Reports medication compliance.;Short term: Daily weights obtained and reported for increase. Utilizing diuretic protocols set by physician.;Long term: Adoption of self-care skills and reduction of barriers for early signs and symptoms recognition and intervention leading to self-care maintenance.   Hypertension Yes   Intervention Provide education on lifestyle modifcations including regular physical activity/exercise, weight management, moderate sodium restriction and increased consumption of fresh fruit, vegetables, and low fat dairy, alcohol moderation, and smoking cessation.;Monitor prescription use compliance.   Expected Outcomes Short Term: Continued assessment and intervention until BP is < 140/9037mG in hypertensive participants. < 130/57m29m in hypertensive participants with diabetes, heart failure or chronic kidney disease.;Long Term: Maintenance of blood pressure at goal levels.   Lipids Yes   Intervention Provide education and support for participant on nutrition & aerobic/resistive exercise along with prescribed medications to achieve LDL <70mg57mL >40mg.34mxpected Outcomes Short Term: Participant states understanding of desired cholesterol values and is compliant with medications prescribed. Participant is following exercise prescription and nutrition guidelines.;Long Term: Cholesterol controlled with medications as prescribed, with individualized  exercise RX and with personalized nutrition plan. Value goals: LDL <  72m, HDL > 40 mg.   Stress Yes   Intervention Offer individual and/or small group education and counseling on adjustment to heart disease, stress management and health-related lifestyle change. Teach and support self-help strategies.;Refer participants experiencing significant psychosocial distress to appropriate mental health specialists for further evaluation and treatment. When possible, include family members and significant others in education/counseling sessions.   Expected Outcomes Short Term: Participant demonstrates changes in health-related behavior, relaxation and other stress management skills, ability to obtain effective social support, and compliance with psychotropic medications if prescribed.;Long Term: Emotional wellbeing is indicated by absence of clinically significant psychosocial distress or social isolation.      Core Components/Risk Factors/Patient Goals Review:      Goals and Risk Factor Review    Row Name 08/22/16 1048             Core Components/Risk Factors/Patient Goals Review   Personal Goals Review Weight Management/Obesity;Heart Failure;Hypertension;Diabetes;Stress;Lipids       Review pt with frequent absences due to allergic dermatitis.  pt does report decreased dyspnea with increased ability to climb stairs and participate in yardwork.  pt demonstrates desire to learn life style modifications.         Expected Outcomes pt will participate in CR activites of exercise, nutrition and life style modification education to derease overall CAD risk factors.           Core Components/Risk Factors/Patient Goals at Discharge (Final Review):      Goals and Risk Factor Review - 08/22/16 1048      Core Components/Risk Factors/Patient Goals Review   Personal Goals Review Weight Management/Obesity;Heart Failure;Hypertension;Diabetes;Stress;Lipids   Review pt with frequent absences due to allergic  dermatitis.  pt does report decreased dyspnea with increased ability to climb stairs and participate in yardwork.  pt demonstrates desire to learn life style modifications.     Expected Outcomes pt will participate in CR activites of exercise, nutrition and life style modification education to derease overall CAD risk factors.       ITP Comments:     ITP Comments    Row Name 07/22/16 0826           ITP Comments Dr. TFransico Him Medical Director          Comments:Pt is making expected progress toward personal goals after completing 9 sessions. Recommend continued exercise and life style modification education including  stress management and relaxation techniques to decrease cardiac risk profile.

## 2016-08-28 ENCOUNTER — Telehealth (HOSPITAL_COMMUNITY): Payer: Self-pay | Admitting: Pharmacist

## 2016-08-28 MED ORDER — SILDENAFIL CITRATE 100 MG PO TABS: 50.0000 mg | ORAL_TABLET | Freq: Every day | ORAL | 0 refills | Status: DC | PRN

## 2016-08-28 NOTE — Telephone Encounter (Signed)
Mr. Schembri called stating that he has been having symptoms of ED recently and wanted to know if any of his medications could be causing this. I have advised him that his carvedilol could be attributing to this as well as his heart failure. Discussed with Dr. [name] and will send in an Rx for Viagra PRN. I have advised him not to take his Bidil on the days that he is to use Viagra. He verbalized understanding and was grateful for the assistance.   Ruta Hinds. Velva Harman, PharmD, BCPS, CPP Clinical Pharmacist Pager: [phone] Phone: [phone] 08/28/2016 4:41 PM

## 2016-08-29 ENCOUNTER — Encounter (HOSPITAL_COMMUNITY): Payer: 59

## 2016-09-01 ENCOUNTER — Encounter (HOSPITAL_COMMUNITY): Payer: 59

## 2016-09-03 ENCOUNTER — Encounter (HOSPITAL_COMMUNITY): Payer: 59

## 2016-09-03 ENCOUNTER — Telehealth (HOSPITAL_COMMUNITY): Payer: Self-pay | Admitting: Cardiac Rehabilitation

## 2016-09-03 ENCOUNTER — Encounter (HOSPITAL_COMMUNITY): Payer: Self-pay | Admitting: *Deleted

## 2016-09-03 DIAGNOSIS — Z736 Limitation of activities due to disability: Secondary | ICD-10-CM

## 2016-09-03 NOTE — Telephone Encounter (Signed)
pc to pt to assess reason for continued absence from cardiac rehab.  Phone recording states " no VM set up yet" .

## 2016-09-03 NOTE — Progress Notes (Signed)
Patient dropped off disability paperwork in office on 08/22/2016.  Paperwork completed today and faxed to [phone] as requested.  Patient also requested for copy to be mailed to home address.  Copy mailed today.  Original form will be scanned to patient's electronic medical record.

## 2016-09-05 ENCOUNTER — Encounter (HOSPITAL_COMMUNITY): Admission: RE | Admit: 2016-09-05 | Payer: 59 | Source: Ambulatory Visit

## 2016-09-08 ENCOUNTER — Encounter (HOSPITAL_COMMUNITY): Payer: 59

## 2016-09-10 ENCOUNTER — Encounter (HOSPITAL_COMMUNITY): Payer: 59

## 2016-09-12 ENCOUNTER — Encounter (HOSPITAL_COMMUNITY): Admission: RE | Admit: 2016-09-12 | Payer: 59 | Source: Ambulatory Visit

## 2016-09-15 ENCOUNTER — Encounter (HOSPITAL_COMMUNITY): Payer: 59

## 2016-09-17 ENCOUNTER — Encounter (HOSPITAL_COMMUNITY): Payer: 59

## 2016-09-19 ENCOUNTER — Encounter (HOSPITAL_COMMUNITY): Payer: 59

## 2016-09-22 ENCOUNTER — Encounter (HOSPITAL_COMMUNITY): Payer: 59

## 2016-09-24 ENCOUNTER — Encounter (HOSPITAL_COMMUNITY): Payer: 59

## 2016-09-24 ENCOUNTER — Encounter (HOSPITAL_COMMUNITY): Payer: 59 | Admitting: Internal Medicine

## 2016-09-24 NOTE — Addendum Note (Signed)
Encounter addended by: Meta Hatchet on: 09/24/2016 12:16 PM<BR>    Actions taken: Visit Navigator Flowsheet section accepted

## 2016-09-26 ENCOUNTER — Encounter (HOSPITAL_COMMUNITY): Payer: 59

## 2016-09-30 ENCOUNTER — Other Ambulatory Visit (HOSPITAL_COMMUNITY): Payer: Self-pay | Admitting: Pharmacist

## 2016-09-30 ENCOUNTER — Encounter (HOSPITAL_COMMUNITY): Payer: Self-pay | Admitting: Pharmacist

## 2016-09-30 ENCOUNTER — Ambulatory Visit (HOSPITAL_COMMUNITY): Payer: Self-pay | Admitting: Cardiac Rehabilitation

## 2016-09-30 DIAGNOSIS — I5022 Chronic systolic (congestive) heart failure: Secondary | ICD-10-CM

## 2016-09-30 MED ORDER — SACUBITRIL-VALSARTAN 97-103 MG PO TABS: 1.0000 | ORAL_TABLET | Freq: Two times a day (BID) | ORAL | 3 refills | Status: DC

## 2016-09-30 MED ORDER — ISOSORB DINITRATE-HYDRALAZINE 20-37.5 MG PO TABS: 1.0000 | ORAL_TABLET | Freq: Three times a day (TID) | ORAL | 3 refills | Status: DC

## 2016-09-30 NOTE — Progress Notes (Signed)
Discharge Summary  Patient Details  Name: Christopher Roth MRN: [id number] Date of Birth: [birth year] Referring Provider:     CARDIAC REHAB PHASE II ORIENTATION from 07/22/2016 in [place]  Referring Provider  Glori Bickers MD       Number of Visits: 9   Reason for Discharge:  Pt discharged from program due to nonattendance.    Smoking History:  History  Smoking Status  . Former Smoker  . Packs/day: 1.00  . Years: 24.00  . Types: Cigarettes  . Quit date: 03/27/2016  Smokeless Tobacco  . Never Used    Diagnosis:  Heart failure, chronic systolic (HCC)  ADL UCSD:   Initial Exercise Prescription:   Discharge Exercise Prescription (Final Exercise Prescription Changes):     Exercise Prescription Changes - 08/18/16 1400      Response to Exercise   Blood Pressure (Admit) 114/80   Blood Pressure (Exercise) 148/80   Blood Pressure (Exit) 96/70   Heart Rate (Admit) 89 bpm   Heart Rate (Exercise) 121 bpm   Heart Rate (Exit) 83 bpm   Rating of Perceived Exertion (Exercise) 10   Duration Progress to 45 minutes of aerobic exercise without signs/symptoms of physical distress   Intensity THRR unchanged     Progression   Progression Continue to progress workloads to maintain intensity without signs/symptoms of physical distress.   Average METs 4.1     Resistance Training   Training Prescription Yes   Weight 8lb   Reps 10-15     Treadmill   MPH 3.3   Grade 3   Minutes 10   METs 4.89     Bike   Level 1.2   Minutes 10   METs 3.38     NuStep   Level 5   SPM 100   Minutes 10   METs 3.9     Home Exercise Plan   Plans to continue exercise at Home (comment)   Frequency Add 4 additional days to program exercise sessions.   Initial Home Exercises Provided 08/15/16      Functional Capacity:   Psychological, QOL, Others - Outcomes: PHQ 2/9: Depression screen PHQ 2/9 07/29/2016  Decreased Interest 0  Down, Depressed,  Hopeless 0  PHQ - 2 Score 0    Quality of Life:   Personal Goals: Goals established at orientation with interventions provided to work toward goal.    Personal Goals Discharge:     Goals and Risk Factor Review    Row Name 08/22/16 1048 09/30/16 1025           Core Components/Risk Factors/Patient Goals Review   Personal Goals Review Weight Management/Obesity;Heart Failure;Hypertension;Diabetes;Stress;Lipids Weight Management/Obesity      Review pt with frequent absences due to allergic dermatitis.  pt does report decreased dyspnea with increased ability to climb stairs and participate in yardwork.  pt demonstrates desire to learn life style modifications.   Pt with 4.8 lb wt loss      Expected Outcomes pt will participate in CR activites of exercise, nutrition and life style modification education to derease overall CAD risk factors.  Continue to improve food choices to promote desired wt loss.          Nutrition & Weight - Outcomes:      Post Biometrics - 09/24/16 1210       Post  Biometrics   Weight 209 lb 10.5 oz (95.1 kg)      Nutrition:   Nutrition Discharge:  Nutrition Assessments - 09/30/16 1023      MEDFICTS Scores   Pre Score 65   Post Score 58   Score Difference -7      Education Questionnaire Score:   Goals reviewed with patient; copy given to patient.

## 2016-09-30 NOTE — Addendum Note (Signed)
Encounter addended by: Jewel Baize, RD on: 09/30/2016 10:26 AM<BR>    Actions taken: Flowsheet data copied forward, Visit Navigator Flowsheet section accepted

## 2016-10-01 ENCOUNTER — Encounter (HOSPITAL_COMMUNITY): Payer: 59

## 2016-10-03 ENCOUNTER — Encounter (HOSPITAL_COMMUNITY): Payer: 59

## 2016-10-06 ENCOUNTER — Encounter (HOSPITAL_COMMUNITY): Payer: 59

## 2016-10-08 ENCOUNTER — Encounter (HOSPITAL_COMMUNITY): Payer: 59

## 2016-10-10 ENCOUNTER — Encounter (HOSPITAL_COMMUNITY): Payer: 59

## 2016-10-13 ENCOUNTER — Other Ambulatory Visit (HOSPITAL_COMMUNITY): Payer: Self-pay | Admitting: Pharmacist

## 2016-10-13 ENCOUNTER — Encounter (HOSPITAL_COMMUNITY): Payer: 59

## 2016-10-13 MED ORDER — ISOSORB DINITRATE-HYDRALAZINE 20-37.5 MG PO TABS: 1.0000 | ORAL_TABLET | Freq: Three times a day (TID) | ORAL | 3 refills | Status: DC

## 2016-10-15 ENCOUNTER — Encounter (HOSPITAL_COMMUNITY): Payer: 59

## 2016-10-17 ENCOUNTER — Encounter (HOSPITAL_COMMUNITY): Payer: 59

## 2016-10-20 ENCOUNTER — Encounter (HOSPITAL_COMMUNITY): Payer: 59

## 2016-10-22 ENCOUNTER — Encounter (HOSPITAL_COMMUNITY): Payer: 59

## 2016-10-24 ENCOUNTER — Encounter (HOSPITAL_COMMUNITY): Payer: 59

## 2016-10-27 ENCOUNTER — Encounter (HOSPITAL_COMMUNITY): Payer: 59

## 2016-10-27 ENCOUNTER — Telehealth (HOSPITAL_COMMUNITY): Payer: Self-pay

## 2016-10-27 NOTE — Telephone Encounter (Signed)
Christopher Roth has been approved for Bidil 20-37.5 mg on 10/21/2016 through 10/21/2017.   Phillis Knack PharmD Candidate

## 2016-10-29 ENCOUNTER — Encounter (HOSPITAL_COMMUNITY): Payer: 59

## 2016-10-31 ENCOUNTER — Encounter (HOSPITAL_COMMUNITY): Payer: 59

## 2016-11-03 ENCOUNTER — Encounter (HOSPITAL_COMMUNITY): Payer: 59

## 2016-11-07 ENCOUNTER — Encounter (HOSPITAL_COMMUNITY): Payer: 59

## 2016-11-10 ENCOUNTER — Encounter (HOSPITAL_COMMUNITY): Payer: 59

## 2016-11-11 ENCOUNTER — Other Ambulatory Visit (HOSPITAL_COMMUNITY): Payer: Self-pay | Admitting: Internal Medicine

## 2016-11-12 ENCOUNTER — Encounter (HOSPITAL_COMMUNITY): Payer: 59

## 2016-11-13 ENCOUNTER — Telehealth (HOSPITAL_COMMUNITY): Payer: Self-pay | Admitting: Pharmacist

## 2016-11-13 NOTE — Telephone Encounter (Signed)
[employer] patient assistance approved for Entresto 97-103 mg BID through 11/12/17.   Ruta Hinds. Velva Harman, PharmD, BCPS, CPP Clinical Pharmacist Pager: [phone] Phone: [phone] 11/13/2016 12:05 PM

## 2016-11-14 ENCOUNTER — Telehealth (HOSPITAL_COMMUNITY): Payer: Self-pay | Admitting: Pharmacist

## 2016-11-14 ENCOUNTER — Encounter (HOSPITAL_COMMUNITY): Payer: 59

## 2016-11-14 NOTE — Telephone Encounter (Signed)
[employer] patient assistance approved for Entresto 97-103 mg BID through 11/12/17.   Ruta Hinds. Velva Harman, PharmD, BCPS, CPP Clinical Pharmacist Pager: [phone] Phone: [phone] 11/14/2016 11:13 AM

## 2016-11-17 ENCOUNTER — Encounter (HOSPITAL_COMMUNITY): Payer: 59

## 2016-11-19 ENCOUNTER — Encounter (HOSPITAL_COMMUNITY): Payer: 59

## 2016-11-19 ENCOUNTER — Other Ambulatory Visit (HOSPITAL_COMMUNITY): Payer: Self-pay | Admitting: Internal Medicine

## 2016-11-21 ENCOUNTER — Encounter (HOSPITAL_COMMUNITY): Payer: 59

## 2016-11-24 ENCOUNTER — Encounter (HOSPITAL_COMMUNITY): Payer: 59

## 2016-12-02 ENCOUNTER — Telehealth (HOSPITAL_COMMUNITY): Payer: Self-pay | Admitting: Cardiology

## 2016-12-02 NOTE — Telephone Encounter (Signed)
Patient called to request a letter explaining his medical condition and how long he was out of work (including his hospitalization). Patient states letter is for his [place] as he is enrolling in a program to help him get caught up on payments  Letter due 8/2.

## 2016-12-02 NOTE — Telephone Encounter (Signed)
Let's do in clinic tomorrow. Can you guys start a draft? thanks

## 2016-12-03 ENCOUNTER — Encounter (HOSPITAL_COMMUNITY): Payer: Self-pay | Admitting: *Deleted

## 2016-12-03 NOTE — Telephone Encounter (Signed)
Letter completed and ready for Dr. [name] to sign.  Patient will pickup this afternoon.

## 2016-12-10 ENCOUNTER — Telehealth (HOSPITAL_COMMUNITY): Payer: Self-pay | Admitting: *Deleted

## 2016-12-10 NOTE — Telephone Encounter (Signed)
Pt left VM on triage line earlier today.  Returned call to pt, he states he has been having dizziness and feeling "off balance" off/on for a little while and it is getting worse.  He states he has not weighed in a while so not sure what wt is running, he states he occasionally takes his BP and it was 125/70 last time he checked it.  Pt reports he has been drinking extra fluid some due to hot weather and on those days he has taken his lasix due to some slight edema.  Pt denies edema or SOB at this time.  Pt states the dizziness is relieved with resting and seem to occur more after he gets up.  Scheduled pt for a nurse visit tomorrow morning to check wt, bp and orthostatics.

## 2016-12-11 ENCOUNTER — Ambulatory Visit (HOSPITAL_COMMUNITY)
Admission: RE | Admit: 2016-12-11 | Discharge: 2016-12-11 | Disposition: A | Discharge status: Home / Self Care | Payer: 59 | Source: Ambulatory Visit | Attending: Internal Medicine | Admitting: Internal Medicine

## 2016-12-11 VITALS — BP 106/72 | HR 64 | Wt 219.5 lb

## 2016-12-11 DIAGNOSIS — I5022 Chronic systolic (congestive) heart failure: Secondary | ICD-10-CM

## 2016-12-11 DIAGNOSIS — R42 Dizziness and giddiness: Secondary | ICD-10-CM

## 2016-12-11 DIAGNOSIS — I5020 Unspecified systolic (congestive) heart failure: Secondary | ICD-10-CM | POA: Diagnosis not present

## 2016-12-11 LAB — BASIC METABOLIC PANEL
ANION GAP: 5 (ref 5–15)
BUN: 8 mg/dL (ref 6–20)
CHLORIDE: 111 mmol/L (ref 101–111)
CO2: 23 mmol/L (ref 22–32)
Calcium: 8.9 mg/dL (ref 8.9–10.3)
Creatinine, Ser: 0.96 mg/dL (ref 0.61–1.24)
GFR calc Af Amer: >60 mL/min (ref >60)
GFR calc non Af Amer: >60 mL/min (ref >60)
GLUCOSE: 162 mg/dL — AB (ref 65–99)
POTASSIUM: 4 mmol/L (ref 3.5–5.1)
Sodium: 139 mmol/L (ref 135–145)

## 2016-12-11 NOTE — Patient Instructions (Addendum)
Please check your blood sugar daily, if continues to run low please follow up with your primary care MD  Keep follow up as scheduled on 02/06/17

## 2016-12-11 NOTE — Progress Notes (Signed)
Pt in for BP check today, BP was ok and orthostatics were normal as well.  Upon further discussion of patients dizziness he states that he has noticed if occurs more frequently in the mornings and if he eats or drinks it helps.  He states he does not always check his glucose in the AM and wonders if it could be low, he states it few times it has been in the 80s, this AM it was 125 before eating.  Discussed all above w/Andy Chalmers Cater, PA he advised getting a bmet and have pt monitor glucose.  Advised pt he is aware and agreeable he will f/u with PCP regarding blood sugars

## 2016-12-18 ENCOUNTER — Telehealth (HOSPITAL_COMMUNITY): Payer: Self-pay | Admitting: *Deleted

## 2016-12-18 NOTE — Telephone Encounter (Signed)
Pt called to ask about labs from last week, advised everything was normal and looked good, glucose 162, he states he had eaten that morning and is f/u with pcp next week.  He states his dizzy spells have improved and he has been feeling pretty good past week.

## 2016-12-19 ENCOUNTER — Other Ambulatory Visit (HOSPITAL_COMMUNITY): Payer: Self-pay | Admitting: Internal Medicine

## 2016-12-26 DIAGNOSIS — J302 Other seasonal allergic rhinitis: Secondary | ICD-10-CM | POA: Insufficient documentation

## 2016-12-26 DIAGNOSIS — L309 Dermatitis, unspecified: Secondary | ICD-10-CM | POA: Insufficient documentation

## 2017-01-28 ENCOUNTER — Telehealth (HOSPITAL_COMMUNITY): Payer: Self-pay | Admitting: *Deleted

## 2017-01-28 NOTE — Telephone Encounter (Signed)
Advanced Heart Failure Triage Encounter  Patient Name: Christopher Roth   Date of Call: 01/28/2017  Problem:  Patient called to cancel his appt for next week. He will be out of town with work. He stated his weight was up about 3lbs in a week. Denies SOB, fatigue, chest pain, edema. Pt admitted to drinking at least a gallon of water daily due to the heat and working.   Plan:  Per Nira Conn pt advised to take his PRN Lasix for the next 2-3 days. Limit fluid intake and to call us Friday with an update. Pt aware and agreeable.   Harvie Junior, CMA

## 2017-02-06 ENCOUNTER — Encounter (HOSPITAL_COMMUNITY): Payer: 59 | Admitting: Internal Medicine

## 2017-04-09 ENCOUNTER — Telehealth (HOSPITAL_COMMUNITY): Payer: Self-pay | Admitting: *Deleted

## 2017-04-09 NOTE — Telephone Encounter (Signed)
Received pt's Bidil from [employer].  Pt is aware med is at front desk for him to p/u

## 2017-05-15 ENCOUNTER — Other Ambulatory Visit (HOSPITAL_COMMUNITY): Payer: Self-pay | Admitting: Internal Medicine

## 2017-05-15 ENCOUNTER — Other Ambulatory Visit (HOSPITAL_COMMUNITY): Payer: Self-pay | Admitting: Cardiology

## 2017-05-22 ENCOUNTER — Encounter (HOSPITAL_COMMUNITY): Payer: Self-pay | Admitting: Internal Medicine

## 2017-05-22 ENCOUNTER — Ambulatory Visit (HOSPITAL_COMMUNITY)
Admission: RE | Admit: 2017-05-22 | Discharge: 2017-05-22 | Disposition: A | Discharge status: Home / Self Care | Payer: BLUE CROSS/BLUE SHIELD | Source: Ambulatory Visit | Attending: Internal Medicine | Admitting: Internal Medicine

## 2017-05-22 ENCOUNTER — Telehealth (HOSPITAL_COMMUNITY): Payer: Self-pay | Admitting: *Deleted

## 2017-05-22 VITALS — BP 144/85 | HR 80 | Wt 217.8 lb

## 2017-05-22 DIAGNOSIS — E1165 Type 2 diabetes mellitus with hyperglycemia: Secondary | ICD-10-CM

## 2017-05-22 DIAGNOSIS — Z833 Family history of diabetes mellitus: Secondary | ICD-10-CM | POA: Insufficient documentation

## 2017-05-22 DIAGNOSIS — I5020 Unspecified systolic (congestive) heart failure: Secondary | ICD-10-CM

## 2017-05-22 DIAGNOSIS — Z7984 Long term (current) use of oral hypoglycemic drugs: Secondary | ICD-10-CM | POA: Diagnosis not present

## 2017-05-22 DIAGNOSIS — Z87891 Personal history of nicotine dependence: Secondary | ICD-10-CM | POA: Insufficient documentation

## 2017-05-22 DIAGNOSIS — Z9889 Other specified postprocedural states: Secondary | ICD-10-CM | POA: Diagnosis not present

## 2017-05-22 DIAGNOSIS — I10 Essential (primary) hypertension: Secondary | ICD-10-CM | POA: Diagnosis not present

## 2017-05-22 DIAGNOSIS — E119 Type 2 diabetes mellitus without complications: Secondary | ICD-10-CM | POA: Diagnosis not present

## 2017-05-22 DIAGNOSIS — Z79899 Other long term (current) drug therapy: Secondary | ICD-10-CM | POA: Insufficient documentation

## 2017-05-22 DIAGNOSIS — I110 Hypertensive heart disease with heart failure: Secondary | ICD-10-CM | POA: Diagnosis not present

## 2017-05-22 DIAGNOSIS — I5042 Chronic combined systolic (congestive) and diastolic (congestive) heart failure: Secondary | ICD-10-CM | POA: Diagnosis present

## 2017-05-22 DIAGNOSIS — F17200 Nicotine dependence, unspecified, uncomplicated: Secondary | ICD-10-CM

## 2017-05-22 DIAGNOSIS — Z8249 Family history of ischemic heart disease and other diseases of the circulatory system: Secondary | ICD-10-CM | POA: Diagnosis not present

## 2017-05-22 DIAGNOSIS — Z91048 Other nonmedicinal substance allergy status: Secondary | ICD-10-CM | POA: Insufficient documentation

## 2017-05-22 DIAGNOSIS — I2510 Atherosclerotic heart disease of native coronary artery without angina pectoris: Secondary | ICD-10-CM | POA: Diagnosis not present

## 2017-05-22 DIAGNOSIS — Z888 Allergy status to other drugs, medicaments and biological substances status: Secondary | ICD-10-CM | POA: Diagnosis not present

## 2017-05-22 DIAGNOSIS — I5022 Chronic systolic (congestive) heart failure: Secondary | ICD-10-CM | POA: Diagnosis not present

## 2017-05-22 DIAGNOSIS — Z7982 Long term (current) use of aspirin: Secondary | ICD-10-CM | POA: Diagnosis not present

## 2017-05-22 LAB — BASIC METABOLIC PANEL
ANION GAP: 11 (ref 5–15)
BUN: 6 mg/dL (ref 6–20)
CALCIUM: 8.9 mg/dL (ref 8.9–10.3)
CO2: 27 mmol/L (ref 22–32)
CREATININE: 0.88 mg/dL (ref 0.61–1.24)
Chloride: 103 mmol/L (ref 101–111)
GFR calc Af Amer: >60 mL/min (ref >60)
Glucose, Bld: 90 mg/dL (ref 65–99)
Potassium: 2.6 mmol/L — CL (ref 3.5–5.1)
SODIUM: 141 mmol/L (ref 135–145)

## 2017-05-22 MED ORDER — ISOSORB DINITRATE-HYDRALAZINE 20-37.5 MG PO TABS: 2.0000 | ORAL_TABLET | Freq: Three times a day (TID) | ORAL | 3 refills | Status: DC

## 2017-05-22 MED ORDER — POTASSIUM CHLORIDE ER 20 MEQ PO TBCR: 20.0000 meq | EXTENDED_RELEASE_TABLET | Freq: Every day | ORAL | 3 refills | Status: DC

## 2017-05-22 NOTE — Patient Instructions (Signed)
Labs today (will call for abnormal results, otherwise no news is good news)  INCREASE Bidil to 2 Tablets Three Times Daily  Follow up in 6 months with Echocardiogram, we will call you to schedule.

## 2017-05-22 NOTE — Telephone Encounter (Signed)
Lab called with critical potassium of 2.6.  Per Dr. [name] patient needs to take potassium 120 mEq (3 doses 40 mEq each at 2-3 hours).  Then start kcl 20 mEq Daily.  Repeat bmet in 1 week.  I called and spoke with patient and he is agreeable with plan.  Medication ordered, bmet ordered, and lab appointment scheduled.

## 2017-05-22 NOTE — Progress Notes (Signed)
Advanced Heart Failure Clinic Note   Referring Physician: Crenshaw Primary Care: [name], MD at [facility] Primary Cardiologist: Dr Stanford Breed HF: Dr. [name]  HPI:  Christopher Roth is a 47 y.o. male with PMH of HTN, DM2, Tobacco abuse, CAD, and systolic CHF.   Admitted 04/20/16 -> 04/25/16 with new onest HF symptoms. Echo 04/21/16 LVEF 25-30%, Grade 2 DD. Diuresed 12 lbs. LHC with significant CAD as below.   He presents today for regular follow up. Last seen in 07/2016.  Has been doing well overall.   He has quit CR. Walking with his job, but not any set aside exercise. Drives a cement truck. Denies SOB, lightheadedness, or dizziness. No edema. Has not smoked in over a year. Has the occasional beer. Has occasional palpitations. Does not limit activity, only happens once every few months. BP running in 120s at home yesterday. BP elevated on arrival to clinic. He took his afternoon Bidil just before arrival.    Social: Lives in [place], Wife and Son  Family history: Brother, 21, has pacemaker -> strokes and vascular disease Mother: HTN, Diabetes Father: Diabetes  cMRI 07/16/16 -> recalculated 08/19/16: EF 41% scar in LAD distribution. No viability   LHC 04/23/16 - Recent total occlusion of the proximal to mid LAD.  - 70% ramus intermedius, 65% ostial circumflex followed by 70% mid circumflex, 75% proximal LAD prior to the origin of a small to moderate size diagonal, and 90% mid PDA. - Mild PAH, PCWP 14 - LVEF 10-15% Hemodynamics RHC Procedural Findings: Hemodynamics (mmHg) RA mean 4 RV 39/1 PA 37/12 PCWP 14 AO 115/78 Cardiac Output (Fick) 5.63 Cardiac Index (Fick) 2.71  Past Medical History:  Diagnosis Date  . Acute systolic congestive heart failure ([place]) 04/22/2016  . Diabetes mellitus without complication ([place])   . Hypertension   . Tobacco use disorder 11/21/2014    Current Outpatient Medications  Medication Sig Dispense Refill  . albuterol (PROVENTIL  HFA;VENTOLIN HFA) 108 (90 Base) MCG/ACT inhaler Inhale 2 puffs into the lungs every 6 (six) hours as needed for wheezing or shortness of breath.    . Alcohol Swabs PADS 1 Package by Does not apply route as needed (with blood glucose checks). 1 each 0  . aspirin 325 MG tablet Take 1 tablet (325 mg total) by mouth daily. 30 tablet 0  . atorvastatin (LIPITOR) 80 MG tablet Take 1 tablet (80 mg total) by mouth daily at 6 PM. 30 tablet 5  . carvedilol (COREG) 6.25 MG tablet TAKE 1 TABLET BY MOUTH TWICE DAILY WITH  A  MEAL 60 tablet 5  . furosemide (LASIX) 20 MG tablet Take 1 tablet (20 mg total) by mouth as needed. For weight gain of 2 lbs or more in 24 hours 15 tablet 11  . glipiZIDE (GLUCOTROL XL) 10 MG 24 hr tablet Take 20 mg by mouth daily with breakfast.     . glucose blood (CHOICE DM FORA G20 TEST STRIPS) test strip Use as instructed 100 each 11  . glucose monitoring kit (FREESTYLE) monitoring kit 1 each by Does not apply route as needed for other. 1 each 0  . isosorbide-hydrALAZINE (BIDIL) 20-37.5 MG tablet Take 1 tablet by mouth 3 (three) times daily. 270 tablet 3  . Lancets (FREESTYLE) lancets Use as instructed 100 each 11  . metFORMIN (GLUCOPHAGE-XR) 500 MG 24 hr tablet Take 1,000 mg by mouth 2 (two) times daily.    . sacubitril-valsartan (ENTRESTO) 97-103 MG Take 1 tablet by mouth 2 (two) times daily.  180 tablet 3  . sildenafil (VIAGRA) 100 MG tablet Take 0.5-1 tablets (50-100 mg total) by mouth daily as needed for erectile dysfunction. Do not take your Bidil on days you use Viagra. 10 tablet 0  . spironolactone (ALDACTONE) 25 MG tablet TAKE 1/2 (ONE-HALF) TABLET BY MOUTH ONCE DAILY 15 tablet 5   No current facility-administered medications for this encounter.     Allergies  Allergen Reactions  . Januvia [Sitagliptin] Hives  . Nickel Rash     Social History   Socioeconomic History  . Marital status: Married    Spouse name: Not on file  . Number of children: Not on file  . Years of  education: Not on file  . Highest education level: Not on file  Social Needs  . Financial resource strain: Not on file  . Food insecurity - worry: Not on file  . Food insecurity - inability: Not on file  . Transportation needs - medical: Not on file  . Transportation needs - non-medical: Not on file  Occupational History  . Not on file  Tobacco Use  . Smoking status: Former Smoker    Packs/day: 1.00    Years: 24.00    Pack years: 24.00    Types: Cigarettes    Last attempt to quit: 03/27/2016    Years since quitting: 1.1  . Smokeless tobacco: Never Used  Substance and Sexual Activity  . Alcohol use: Yes  . Drug use: No  . Sexual activity: Not on file  Other Topics Concern  . Not on file  Social History Narrative  . Not on file   Family History  Problem Relation Age of Onset  . Diabetes Mellitus II Mother     Vitals:   05/22/17 1344  BP: (!) 144/85  Pulse: 80  SpO2: 100%  Weight: 217 lb 12.8 oz (98.8 kg)   Wt Readings from Last 3 Encounters:  05/22/17 217 lb 12.8 oz (98.8 kg)  12/11/16 219 lb 8 oz (99.6 kg)  09/24/16 209 lb 10.5 oz (95.1 kg)   PHYSICAL EXAM: General: Well appearing. No resp difficulty. HEENT: Normal. Anicteric  Neck: Supple. JVP 5-6. Carotids 2+ bilat; no bruits. No thyromegaly or nodule noted. Cor: PMI nondisplaced. Normal S1, Wide-Split S2 due to RBBB, No M/G/R noted Lungs: CTAB, normal effort. Abdomen: Soft, non-tender, non-distended, no HSM. No bruits or masses. +BS  Extremities: No cyanosis, clubbing, or rash. R and LLE no edema.  Neuro: Alert & orientedx3, cranial nerves grossly intact. moves all 4 extremities w/o difficulty. Affect pleasant   ASSESSMENT & PLAN:  1. Chronic combined systolic and diastolic CHF, NYHA II : iCM with Echo 04/2016 LVEF 25-30%, Grade 2 DD (EF 10-15% by cath) - Cath with chronic occlusion of LAD with no viability on MRI. Moderate non-obstructive CAD elsewhere - cMRI 07/16/16 -> recalculated 08/2016 EF 41%.  Anterior scar with no significant viability - NYHA I-II symptoms - Volume status looks stable on exam.   - Continue Lasix 20 mg as needed.  - Continue Coreg 6.25 mg BID. Historically up-titration has been limited by bradycardia.  - Continue Entresto to 97/138m BID.  BMET today.  - Continue Spiro 229mdaily.  - Increase Bidil to 2 tabs TID.   - Out of ICD range. 2. CAD via [place] - Chronic occlusion of LAD with no viability on MRI. Moderate non-obstructive CAD elsewhere - No ischemic symptoms - Continue ASA and statin 3. HTN - BP elevated. Meds as above.  4. DMII -  Per PCP.  5. Tobacco abuse - Has stopped smoking since Dec. 2017. No change.  Doing well overall. Meds as above and labs today. RTC 6 months with repeat Echo. If EF remains stable. Can graduate to [place], PA-C  1:58 PM  Patient seen and examined with the above-signed Advanced Practice Provider and/or Housestaff. I personally reviewed laboratory data, imaging studies and relevant notes. I independently examined the patient and formulated the important aspects of the plan. I have edited the note to reflect any of my changes or salient points. I have personally discussed the plan with the patient and/or family.  Doing well has EF 41% due to iCM with chronic LAD occlusion and no viability on cMRI. NYHA I. BP elevated. Volume status ok. Will increase bidil to 2 tabs bid. K low on labs today. Add kcl. Repeat echo. If EF stable can graduate to [facility].   Glori Bickers, MD  12:07 AM

## 2017-05-28 ENCOUNTER — Telehealth (HOSPITAL_COMMUNITY): Payer: Self-pay | Admitting: *Deleted

## 2017-05-28 ENCOUNTER — Ambulatory Visit (HOSPITAL_COMMUNITY)
Admission: RE | Admit: 2017-05-28 | Discharge: 2017-05-28 | Disposition: A | Discharge status: Home / Self Care | Payer: BLUE CROSS/BLUE SHIELD | Source: Ambulatory Visit | Attending: Internal Medicine | Admitting: Internal Medicine

## 2017-05-28 DIAGNOSIS — I5022 Chronic systolic (congestive) heart failure: Secondary | ICD-10-CM | POA: Diagnosis present

## 2017-05-28 LAB — BASIC METABOLIC PANEL
ANION GAP: 9 (ref 5–15)
BUN: 9 mg/dL (ref 6–20)
CO2: 25 mmol/L (ref 22–32)
Calcium: 9 mg/dL (ref 8.9–10.3)
Chloride: 108 mmol/L (ref 101–111)
Creatinine, Ser: 0.87 mg/dL (ref 0.61–1.24)
GFR calc Af Amer: >60 mL/min (ref >60)
GLUCOSE: 103 mg/dL — AB (ref 65–99)
Potassium: 4 mmol/L (ref 3.5–5.1)
Sodium: 142 mmol/L (ref 135–145)

## 2017-05-28 NOTE — Telephone Encounter (Signed)
Patient dropped in after lab appointment to talk with me about have difficulty laying down for the past 3 nights.  Says he gets SOB and feels like he is wheezing.  Patient stated that he does feel like he has gained weight so he decided to take 2 lasix pills this morning.  He stated he has voided a lot today.  I advised him to see how he does tonight since he took lasix and if he needs to take another one tomorrow he can.  If he continues to have problems I advised him to call us back.

## 2017-06-20 ENCOUNTER — Other Ambulatory Visit (HOSPITAL_COMMUNITY): Payer: Self-pay | Admitting: Internal Medicine

## 2017-06-22 ENCOUNTER — Encounter (HOSPITAL_COMMUNITY): Payer: Self-pay | Admitting: Pharmacist

## 2017-06-23 ENCOUNTER — Telehealth (HOSPITAL_COMMUNITY): Payer: Self-pay | Admitting: Pharmacist

## 2017-06-23 NOTE — Telephone Encounter (Signed)
Bidil PA denied by [facility] commercial. Appeal sent on 06/22/17. Mr. Folmar aware and grateful for the assistance.   Ruta Hinds. Velva Harman, PharmD, BCPS, CPP Clinical Pharmacist Phone: [phone] 06/23/2017 10:28 AM

## 2017-06-29 ENCOUNTER — Telehealth (HOSPITAL_COMMUNITY): Payer: Self-pay | Admitting: Pharmacist

## 2017-06-29 NOTE — Telephone Encounter (Signed)
Bidil appeal approved by [employer] through 05/04/38.   Ruta Hinds. Velva Harman, PharmD, BCPS, CPP Clinical Pharmacist Phone: [phone] 06/29/2017 10:02 AM

## 2017-07-14 ENCOUNTER — Telehealth (HOSPITAL_COMMUNITY): Payer: Self-pay

## 2017-07-14 NOTE — Telephone Encounter (Signed)
Samples came in for patient  Medication Samples have been provided to the patient.  Drug name: BIDIL       Strength: 20/37.5        Qty: 2  LOT: [id number]  Exp.Date: 01/03/2019  Dosing instructions: 2 tabs, three times a day  The patient has been instructed regarding the correct time, dose, and frequency of taking this medication, including desired effects and most common side effects.   Shirley Muscat 8:59 AM 07/14/2017

## 2017-08-03 ENCOUNTER — Other Ambulatory Visit (HOSPITAL_COMMUNITY): Payer: Self-pay | Admitting: Internal Medicine

## 2017-08-04 ENCOUNTER — Other Ambulatory Visit (HOSPITAL_COMMUNITY): Payer: Self-pay | Admitting: Pharmacist

## 2017-08-04 MED ORDER — FUROSEMIDE 20 MG PO TABS: 20.0000 mg | ORAL_TABLET | ORAL | 11 refills | Status: DC | PRN

## 2017-10-08 ENCOUNTER — Telehealth (HOSPITAL_COMMUNITY): Payer: Self-pay

## 2017-10-08 NOTE — Telephone Encounter (Signed)
Patient calling for Bidil samples. 16 day supply left at front desk. Patient aware.  Renee Pain, RN

## 2017-10-08 NOTE — Telephone Encounter (Signed)
Pt awre medication has arrived and will be at front desk for pick-up.

## 2017-10-19 ENCOUNTER — Other Ambulatory Visit (HOSPITAL_COMMUNITY): Payer: Self-pay | Admitting: *Deleted

## 2017-10-19 MED ORDER — SACUBITRIL-VALSARTAN 97-103 MG PO TABS: 1.0000 | ORAL_TABLET | Freq: Two times a day (BID) | ORAL | 3 refills | Status: DC

## 2017-10-27 ENCOUNTER — Telehealth (HOSPITAL_COMMUNITY): Payer: Self-pay

## 2017-10-27 NOTE — Telephone Encounter (Signed)
Patient calling to report cough with crackles for a few days.  States he is unsure if he has a cold or if fluid is building up. Weight stable at 213 lbs (217 in clinic last visit).  No swelling or distention noted. Has been taking 20 mg prn lasix onc daily past 3 days with no resolution of s/s. Advised to f/u with pcp or urgent care today to r/o respiratory infection or cold. Patient aware and agreeable.  Renee Pain, RN

## 2017-11-16 ENCOUNTER — Telehealth (HOSPITAL_COMMUNITY): Payer: Self-pay

## 2017-11-16 NOTE — Telephone Encounter (Signed)
Medication Samples have been provided to the patient.  Drug name: Bidil       Strength: 32m        Qty: 4  LOT: [id number]  Exp.Date: 08/20  Dosing instructions:  Take 2 tablets by mouth 3 (three) times daily.          The patient has been instructed regarding the correct time, dose, and frequency of taking this medication, including desired effects and most common side effects.   AVanice Sarah2:58 PM 11/16/2017

## 2017-11-24 ENCOUNTER — Emergency Department (HOSPITAL_COMMUNITY): Payer: 59

## 2017-11-24 ENCOUNTER — Encounter (HOSPITAL_COMMUNITY): Payer: Self-pay | Admitting: Emergency Medicine

## 2017-11-24 ENCOUNTER — Emergency Department (HOSPITAL_COMMUNITY)
Admission: EM | Admit: 2017-11-24 | Discharge: 2017-11-25 | Disposition: A | Discharge status: Home / Self Care | Payer: 59 | Attending: Emergency Medicine | Admitting: Emergency Medicine

## 2017-11-24 ENCOUNTER — Other Ambulatory Visit: Payer: Self-pay

## 2017-11-24 DIAGNOSIS — R0602 Shortness of breath: Secondary | ICD-10-CM | POA: Diagnosis not present

## 2017-11-24 DIAGNOSIS — R0989 Other specified symptoms and signs involving the circulatory and respiratory systems: Secondary | ICD-10-CM | POA: Diagnosis not present

## 2017-11-24 DIAGNOSIS — R05 Cough: Secondary | ICD-10-CM | POA: Diagnosis not present

## 2017-11-24 DIAGNOSIS — R2243 Localized swelling, mass and lump, lower limb, bilateral: Secondary | ICD-10-CM | POA: Diagnosis not present

## 2017-11-24 DIAGNOSIS — Z79899 Other long term (current) drug therapy: Secondary | ICD-10-CM | POA: Insufficient documentation

## 2017-11-24 DIAGNOSIS — I110 Hypertensive heart disease with heart failure: Secondary | ICD-10-CM | POA: Diagnosis not present

## 2017-11-24 DIAGNOSIS — I2510 Atherosclerotic heart disease of native coronary artery without angina pectoris: Secondary | ICD-10-CM | POA: Diagnosis not present

## 2017-11-24 DIAGNOSIS — E119 Type 2 diabetes mellitus without complications: Secondary | ICD-10-CM | POA: Insufficient documentation

## 2017-11-24 DIAGNOSIS — Z87891 Personal history of nicotine dependence: Secondary | ICD-10-CM | POA: Diagnosis not present

## 2017-11-24 DIAGNOSIS — Z7982 Long term (current) use of aspirin: Secondary | ICD-10-CM | POA: Insufficient documentation

## 2017-11-24 DIAGNOSIS — R002 Palpitations: Secondary | ICD-10-CM | POA: Diagnosis present

## 2017-11-24 DIAGNOSIS — J8410 Pulmonary fibrosis, unspecified: Secondary | ICD-10-CM

## 2017-11-24 DIAGNOSIS — I5023 Acute on chronic systolic (congestive) heart failure: Secondary | ICD-10-CM

## 2017-11-24 DIAGNOSIS — Z7984 Long term (current) use of oral hypoglycemic drugs: Secondary | ICD-10-CM | POA: Diagnosis not present

## 2017-11-24 LAB — CBC
HCT: 31.9 % — ABNORMAL LOW (ref 39.0–52.0)
HEMOGLOBIN: 10 g/dL — AB (ref 13.0–17.0)
MCH: 26.4 pg (ref 26.0–34.0)
MCHC: 31.3 g/dL (ref 30.0–36.0)
MCV: 84.2 fL (ref 78.0–100.0)
Platelets: 331 10*3/uL (ref 150–400)
RBC: 3.79 MIL/uL — ABNORMAL LOW (ref 4.22–5.81)
RDW: 17.5 % — AB (ref 11.5–15.5)
WBC: 6.3 10*3/uL (ref 4.0–10.5)

## 2017-11-24 LAB — BASIC METABOLIC PANEL
ANION GAP: 8 (ref 5–15)
BUN: 13 mg/dL (ref 6–20)
CO2: 27 mmol/L (ref 22–32)
CREATININE: 1.07 mg/dL (ref 0.61–1.24)
Calcium: 9.1 mg/dL (ref 8.9–10.3)
Chloride: 110 mmol/L (ref 98–111)
GFR calc Af Amer: >60 mL/min (ref >60)
GLUCOSE: 125 mg/dL — AB (ref 70–99)
Potassium: 3.2 mmol/L — ABNORMAL LOW (ref 3.5–5.1)
Sodium: 145 mmol/L (ref 135–145)

## 2017-11-24 LAB — I-STAT TROPONIN, ED: TROPONIN I, POC: 0 ng/mL (ref 0.00–0.08)

## 2017-11-24 LAB — BRAIN NATRIURETIC PEPTIDE: B Natriuretic Peptide: 1397.6 pg/mL — ABNORMAL HIGH (ref 0.0–100.0)

## 2017-11-24 NOTE — ED Triage Notes (Signed)
Pt presents with swelling, nonproductive cough, palpitations that began at 3am this morning; hx of CHF; pt denies pain but something just does not feel right

## 2017-11-25 ENCOUNTER — Telehealth (HOSPITAL_COMMUNITY): Payer: Self-pay | Admitting: *Deleted

## 2017-11-25 NOTE — Telephone Encounter (Signed)
Patient called saying he was in the ER yesterday and they advised him to schedule a f/u with our clinic this week.  Patient is feeling much better and verbalized medication compliance.  Scheduled him for f/u appt with our [facility] next Week.  No further questions.

## 2017-11-25 NOTE — ED Notes (Signed)
ED Provider at bedside. 

## 2017-11-25 NOTE — Discharge Instructions (Signed)
Thank you for allowing me to care for you today in the Emergency Department.   Take 40 mg of Lasix daily for 3 days then resume your 20 mg of Lasix as needed.   Consider wearing a mask at work when you are around dust or fumes.  Return to the Emergency Department if you develop shortness of breath the doesn't go away, chest pain, a high fever, if you pass out or other new concerning, symptoms.

## 2017-11-25 NOTE — ED Provider Notes (Signed)
[facility] EMERGENCY DEPARTMENT Provider Note   CSN: [id number] Arrival date & time: 11/24/17  2001     History   Chief Complaint Chief Complaint  Patient presents with  . Palpitations  . Cough    HPI Christopher Roth is a 47 y.o. male with a history of chronic systolic congestive heart failure, diabetes mellitus, CAD and hypertension who presents to the emergency department with a chief complaint of chest congestion, nonproductive cough, and mild bilateral leg swelling that began today.  The patient reports that he was seen at urgent care where he had an abnormal EKG with no previous images to compare to and was sent to the emergency department for further evaluation.  He denies chest pain, weakness, numbness, orthopnea, neck pain, jaw pain, arm pain, syncope, lightheadedness, dizziness, or visual changes.  He reports a short episode of shortness of breath yesterday that lasted for a few minutes before resolving.  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  He reports noncompliant recently with a low-sodium diet.  Reports that he was eating prior chicken wings earlier today.  He has been wearing compression stockings to help with lower extremity swelling and has been taking all of his home medications as directed.  He is on 20 mg of Lasix as needed.  Last dose was today.  Before today, his last dose was last week.  Cardiology is Dr. [name].  He is scheduled for a follow-up in August.  The history is provided by the patient. No language interpreter was used.  Palpitations   Associated symptoms include cough and shortness of breath (resolved). Pertinent negatives include no fever, no chest pain, no abdominal pain, no nausea, no vomiting, no headaches, no back pain, no dizziness and no weakness.  Cough  Associated symptoms include shortness of breath (resolved). Pertinent negatives include no chest pain, no headaches, no sore throat and no wheezing.    Past  Medical History:  Diagnosis Date  . Acute systolic congestive heart failure ([place]) 04/22/2016  . Diabetes mellitus without complication ([place])   . Hypertension   . Tobacco use disorder 11/21/2014    Patient Active Problem List   Diagnosis Date Noted  . CAD (coronary artery disease) 04/29/2016  . Systolic CHF with reduced left ventricular function, NYHA class 2 ([place])   . Pneumonia 04/21/2016  . Tobacco use disorder 11/21/2014  . Diabetes mellitus type II, uncontrolled ([place])   . Perirectal abscess 11/17/2014  . HAND PAIN, RIGHT 08/04/2007  . OBESITY 01/01/2007  . ERECTILE DYSFUNCTION 01/01/2007  . Essential hypertension 01/01/2007  . ALLERGIC RHINITIS 01/01/2007  . GERD 01/01/2007    Past Surgical History:  Procedure Laterality Date  . CARDIAC CATHETERIZATION N/A 04/23/2016   Procedure: Right/Left Heart Cath and Coronary Angiography;  Surgeon: Belva Crome, MD;  Location: [facility];  Service: Cardiovascular;  Laterality: N/A;  . INCISION AND DRAINAGE PERIRECTAL ABSCESS Right 11/17/2014   Procedure: debridement of right perianal and groin wound;  Surgeon: Johnathan Hausen, MD;  Location: [facility];  Service: General;  Laterality: Right;        Home Medications    Prior to Admission medications   Medication Sig Start Date End Date Taking? Authorizing Provider  albuterol (PROVENTIL HFA;VENTOLIN HFA) 108 (90 Base) MCG/ACT inhaler Inhale 2 puffs into the lungs every 6 (six) hours as needed for wheezing or shortness of breath.    [provider]  Alcohol Swabs PADS 1 Package by Does not  apply route as needed (with blood glucose checks). 11/21/14   Robbie Lis, MD  aspirin 325 MG tablet Take 1 tablet (325 mg total) by mouth daily. 04/26/16   Verlee Monte, MD  atorvastatin (LIPITOR) 80 MG tablet Take 1 tablet (80 mg total) by mouth daily at 6 PM. 05/26/16   Bensimhon, Shaune Pascal, MD  atorvastatin (LIPITOR) 80 MG tablet TAKE ONE TABLET BY MOUTH DAILY AT 6 PM. 06/22/17    Bensimhon, Shaune Pascal, MD  carvedilol (COREG) 6.25 MG tablet TAKE 1 TABLET BY MOUTH TWICE DAILY WITH  A  MEAL 05/18/17   Bensimhon, Shaune Pascal, MD  furosemide (LASIX) 20 MG tablet Take 1 tablet (20 mg total) by mouth as needed for fluid. 08/05/17   Bensimhon, Shaune Pascal, MD  furosemide (LASIX) 20 MG tablet Take 1 tablet (20 mg total) by mouth as needed. For weight gain of 2 lbs or more in 24 hours 08/04/17   Bensimhon, Shaune Pascal, MD  glipiZIDE (GLUCOTROL XL) 10 MG 24 hr tablet Take 20 mg by mouth daily with breakfast.     [provider]  glucose blood (CHOICE DM FORA G20 TEST STRIPS) test strip Use as instructed 11/21/14   Robbie Lis, MD  glucose monitoring kit (FREESTYLE) monitoring kit 1 each by Does not apply route as needed for other. 11/21/14   Robbie Lis, MD  isosorbide-hydrALAZINE (BIDIL) 20-37.5 MG tablet Take 2 tablets by mouth 3 (three) times daily. 05/22/17   Bensimhon, Shaune Pascal, MD  Lancets (FREESTYLE) lancets Use as instructed 11/21/14   Robbie Lis, MD  metFORMIN (GLUCOPHAGE-XR) 500 MG 24 hr tablet Take 1,000 mg by mouth 2 (two) times daily.    [provider]  potassium chloride 20 MEQ TBCR Take 20 mEq by mouth daily. 05/22/17   Bensimhon, Shaune Pascal, MD  sacubitril-valsartan (ENTRESTO) 97-103 MG Take 1 tablet by mouth 2 (two) times daily. 10/19/17   Bensimhon, Shaune Pascal, MD  sildenafil (VIAGRA) 100 MG tablet Take 0.5-1 tablets (50-100 mg total) by mouth daily as needed for erectile dysfunction. Do not take your Bidil on days you use Viagra. 08/28/16   Bensimhon, Shaune Pascal, MD  spironolactone (ALDACTONE) 25 MG tablet TAKE 1/2 (ONE-HALF) TABLET BY MOUTH ONCE DAILY 05/18/17   Bensimhon, Shaune Pascal, MD    Family History Family History  Problem Relation Age of Onset  . Diabetes Mellitus II Mother     Social History Social History   Tobacco Use  . Smoking status: Former Smoker    Packs/day: 1.00    Years: 24.00    Pack years: 24.00    Types: Cigarettes    Last attempt to  quit: 03/27/2016    Years since quitting: 1.6  . Smokeless tobacco: Never Used  Substance Use Topics  . Alcohol use: Yes  . Drug use: No     Allergies   Januvia [sitagliptin] and Nickel   Review of Systems Review of Systems  Constitutional: Negative for appetite change and fever.  HENT: Negative for congestion and sore throat.   Eyes: Negative for visual disturbance.  Respiratory: Positive for cough and shortness of breath (resolved). Negative for wheezing.   Cardiovascular: Positive for palpitations and leg swelling. Negative for chest pain.  Gastrointestinal: Negative for abdominal pain, diarrhea, nausea and vomiting.  Genitourinary: Negative for dysuria.  Musculoskeletal: Negative for back pain and neck pain.  Skin: Negative for rash.  Allergic/Immunologic: Negative for immunocompromised state.  Neurological: Negative for dizziness, syncope, weakness, light-headedness and  headaches.  Psychiatric/Behavioral: Negative for confusion.     Physical Exam Updated Vital Signs BP (!) 164/94   Pulse 67   Temp 98.5 F (36.9 C) (Oral)   Resp (!) 22   Ht 5' 8"  (1.727 m)   Wt 99.8 kg (220 lb)   SpO2 92%   BMI 33.45 kg/m   Physical Exam  Constitutional: He appears well-developed.  Well-appearing.  HENT:  Head: Normocephalic.  Eyes: Conjunctivae are normal.  Neck: Neck supple. No JVD present.  Cardiovascular: Normal rate, regular rhythm, normal heart sounds and intact distal pulses. Exam reveals no gallop and no friction rub.  No murmur heard. Pulses:      Carotid pulses are 2+ on the right side, and 2+ on the left side.      Radial pulses are 2+ on the right side, and 2+ on the left side.       Dorsalis pedis pulses are 2+ on the right side, and 2+ on the left side.       Posterior tibial pulses are 2+ on the right side, and 2+ on the left side.  Pulmonary/Chest: Effort normal and breath sounds normal. No stridor. No respiratory distress. He has no wheezes. He has no  rales. He exhibits no tenderness.  Lungs are clear to auscultation bilaterally.  Abdominal: Soft. He exhibits no distension.  Abdomen is soft, nontender, nondistended.  Musculoskeletal: He exhibits edema.  1+ bilateral pitting edema to the bilateral lower extremities.  Ambulatory without dyspnea or respiratory distress.  Neurological: He is alert.  Skin: Skin is warm and dry.  Psychiatric: His behavior is normal.  Nursing note and vitals reviewed.  ED Treatments / Results  Labs (all labs ordered are listed, but only abnormal results are displayed) Labs Reviewed  BASIC METABOLIC PANEL - Abnormal; Notable for the following components:      Result Value   Potassium 3.2 (*)    Glucose, Bld 125 (*)    All other components within normal limits  CBC - Abnormal; Notable for the following components:   RBC 3.79 (*)    Hemoglobin 10.0 (*)    HCT 31.9 (*)    RDW 17.5 (*)    All other components within normal limits  BRAIN NATRIURETIC PEPTIDE - Abnormal; Notable for the following components:   B Natriuretic Peptide 1,397.6 (*)    All other components within normal limits  I-STAT TROPONIN, ED    EKG EKG Interpretation  Date/Time:  Tuesday November 24 2017 20:37:52 EDT Ventricular Rate:  68 PR Interval:  186 QRS Duration: 156 QT Interval:  446 QTC Calculation: 474 R Axis:   -159 Text Interpretation:  Normal sinus rhythm Right bundle branch block Anteroseptal infarct , age undetermined Abnormal ECG Confirmed by Ripley Fraise [phone]) on 11/25/2017 12:18:57 AM   Radiology Dg Chest 2 View  Result Date: 11/24/2017 CLINICAL DATA:  Bilateral lower extremity swelling. Nonproductive cough. Palpitations beginning at 3 a.m. History of congestive heart failure, diabetes, hypertension. Left lower chest pain. Former smoker. EXAM: CHEST - 2 VIEW COMPARISON:  04/22/2016 FINDINGS: Heart size and pulmonary vascularity are normal. Peribronchial thickening and interstitial infiltrates in the lungs  likely representing chronic bronchitis. Linear scarring in the mid lungs. No focal consolidation. No pleural effusions. No pneumothorax. Mediastinal contours appear intact. IMPRESSION: Chronic bronchitic changes and interstitial fibrosis in the lungs. No evidence of active pulmonary disease. Electronically Signed   By: Lucienne Capers M.D.   On: 11/24/2017 21:37    Procedures Procedures (including  critical care time)  Medications Ordered in ED Medications - No data to display   Initial Impression / Assessment and Plan / ED Course  I have reviewed the triage vital signs and the nursing notes.  Pertinent labs & imaging results that were available during my care of the patient were reviewed by me and considered in my medical decision making (see chart for details).     47 year old male with a history of chronic systolic congestive heart failure, diabetes mellitus, CAD and hypertension presenting with chest congestion, nonproductive cough, and mild swelling to the bilateral lower extremities, onset today.  He was seen earlier today at urgent care where he had an EKG performed with no previous images to compare it to.  It was abnormal so he was sent to the emergency department for further evaluation.  He is on 20 mg of Lasix as needed, last dose today.  He has a follow-up with his cardiologist, Dr. [name], in August.  He reports poor compliance with his low-sodium diet.  EKG unchanged from previous.  Troponin is negative.  BNP 1397.  The patient continues to deny chest pain and has no dyspnea.  He is ambulatory without becoming dyspneic or having respiratory distress.  The patient was seen and evaluated along with Dr. Christy Gentles, attending physician.  Chest x-ray demonstrating interstitial pulmonary fibrosis, but no signs of vascular congestion.  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   Doubt ACS at this time.   Given how well-appearing the patient is without hypoxia, shared decision making conversation and the patient is agreeable to being discharged home and doubling his home Lasix for the next 3 days.  He will plan to cardiac cardiology in the morning to schedule a follow-up appointment for this week.  Also discussed wearing a mask at work when he is around large areas of dust given interstitial fibrosis demonstrated on his x-ray.  He was given strict return precautions to the emergency room.  No hypoxia and SaO2 has remained between 92-95% on room air.  He has mildly hypertensive in the ED, but is asymptomatic and has not taken his home p.m. doses of his antihypertensives.  Strict return precautions given.  He is hemodynamically stable and in no acute distress.  He is safe for discharge to home with outpatient follow-up at this time.  Final Clinical Impressions(s) / ED Diagnoses   Final diagnoses:  Acute on chronic systolic (congestive) heart failure [facility])  Pulmonary interstitial fibrosis [facility])    ED Discharge Orders    None       Joanne Gavel, PA-C 11/25/17 0203    Ripley Fraise, MD 11/25/17 [phone]

## 2017-11-29 ENCOUNTER — Other Ambulatory Visit (HOSPITAL_COMMUNITY): Payer: Self-pay | Admitting: Internal Medicine

## 2017-12-02 ENCOUNTER — Encounter (HOSPITAL_COMMUNITY): Payer: Self-pay

## 2017-12-02 ENCOUNTER — Ambulatory Visit (HOSPITAL_COMMUNITY)
Admission: RE | Admit: 2017-12-02 | Discharge: 2017-12-02 | Disposition: A | Discharge status: Home / Self Care | Payer: 59 | Source: Ambulatory Visit | Attending: Cardiology | Admitting: Cardiology

## 2017-12-02 VITALS — BP 178/104 | HR 68 | Wt 220.0 lb

## 2017-12-02 DIAGNOSIS — Z7984 Long term (current) use of oral hypoglycemic drugs: Secondary | ICD-10-CM | POA: Insufficient documentation

## 2017-12-02 DIAGNOSIS — I110 Hypertensive heart disease with heart failure: Secondary | ICD-10-CM | POA: Insufficient documentation

## 2017-12-02 DIAGNOSIS — I5042 Chronic combined systolic (congestive) and diastolic (congestive) heart failure: Secondary | ICD-10-CM | POA: Diagnosis present

## 2017-12-02 DIAGNOSIS — I10 Essential (primary) hypertension: Secondary | ICD-10-CM | POA: Diagnosis not present

## 2017-12-02 DIAGNOSIS — I5020 Unspecified systolic (congestive) heart failure: Secondary | ICD-10-CM

## 2017-12-02 DIAGNOSIS — Z7982 Long term (current) use of aspirin: Secondary | ICD-10-CM | POA: Insufficient documentation

## 2017-12-02 DIAGNOSIS — Z888 Allergy status to other drugs, medicaments and biological substances status: Secondary | ICD-10-CM | POA: Diagnosis not present

## 2017-12-02 DIAGNOSIS — I2510 Atherosclerotic heart disease of native coronary artery without angina pectoris: Secondary | ICD-10-CM

## 2017-12-02 DIAGNOSIS — Z87891 Personal history of nicotine dependence: Secondary | ICD-10-CM | POA: Diagnosis not present

## 2017-12-02 DIAGNOSIS — Z8249 Family history of ischemic heart disease and other diseases of the circulatory system: Secondary | ICD-10-CM | POA: Diagnosis not present

## 2017-12-02 DIAGNOSIS — Z79899 Other long term (current) drug therapy: Secondary | ICD-10-CM | POA: Diagnosis not present

## 2017-12-02 DIAGNOSIS — Z823 Family history of stroke: Secondary | ICD-10-CM | POA: Insufficient documentation

## 2017-12-02 DIAGNOSIS — Z833 Family history of diabetes mellitus: Secondary | ICD-10-CM | POA: Insufficient documentation

## 2017-12-02 DIAGNOSIS — E119 Type 2 diabetes mellitus without complications: Secondary | ICD-10-CM | POA: Insufficient documentation

## 2017-12-02 LAB — BASIC METABOLIC PANEL
ANION GAP: 9 (ref 5–15)
BUN: 11 mg/dL (ref 6–20)
CALCIUM: 9.1 mg/dL (ref 8.9–10.3)
CHLORIDE: 105 mmol/L (ref 98–111)
CO2: 30 mmol/L (ref 22–32)
Creatinine, Ser: 1.07 mg/dL (ref 0.61–1.24)
GFR calc non Af Amer: >60 mL/min (ref >60)
GLUCOSE: 129 mg/dL — AB (ref 70–99)
Potassium: 3.3 mmol/L — ABNORMAL LOW (ref 3.5–5.1)
SODIUM: 144 mmol/L (ref 135–145)

## 2017-12-02 MED ORDER — HYDRALAZINE HCL 100 MG PO TABS: 50.0000 mg | ORAL_TABLET | Freq: Three times a day (TID) | ORAL | 11 refills | Status: DC

## 2017-12-02 MED ORDER — SPIRONOLACTONE 25 MG PO TABS: 25.0000 mg | ORAL_TABLET | Freq: Every day | ORAL | 11 refills | Status: DC

## 2017-12-02 MED ORDER — ISOSORBIDE MONONITRATE ER 30 MG PO TB24: 30.0000 mg | ORAL_TABLET | Freq: Every day | ORAL | 11 refills | Status: DC

## 2017-12-02 NOTE — Patient Instructions (Addendum)
STOP Bidil START Isosorbide 30 mg , one tab daily START Hydralazine 50 mg, one half tab three times daily INCREASE Spironolactone to 25 mg ,one tab daily   Labs today We will only contact you if something comes back abnormal or we need to make some changes. Otherwise no news is good news!  Labs needed in 10-14 days   Your physician recommends that you keep your a follow-up appointment as scheduled in 3-4 weeks

## 2017-12-02 NOTE — Progress Notes (Signed)
Advanced Heart Failure Clinic Note   Referring Physician: C[facility]shaw Primary Care: [name], MD at [facility] family Primary Cardiologist: Dr Stanford Breed HF: Dr. [name]  HPI: Christopher Roth is a 47 y.o. male with PMH of HTN, DM2, Tobacco abuse, CAD, and systolic CHF.   Admitted 04/20/16 -> 04/25/16 with new onest HF symptoms. Echo 04/21/16 LVEF 25-30%, Grade 2 DD. Diuresed 12 lbs. LHC with significant CAD as below.   Evaluated 11/25/17 in ED with chest pain. Not thouught to be ACS. Troponin 0.0. BNP 1397. He was instructed to double lasix for 3 days and follow up with HF.  He returns for HF follow up. He was last seen in January. At that time he was instructed to increase bidil to 2 tab three times a day. He couldn't afford bidil so he kept taking 1 tab a few times a day. He ran out of bidil last week. . Overall feeling better. Denies SOB/PND/Orthopnea. No chest pain.  Appetite ok. No fever or chills. Weight at home  Has been going up. Taking all medications. Working full time.   Social: Lives in [place], Wife and Son  Family history: Brother, 56, has pacemaker -> strokes and vascular disease Mother: HTN, Diabetes Father: Diabetes  cMRI 07/16/16 -> recalculated 08/19/16: EF 41% scar in LAD distribution. No viability   LHC 04/23/16 - Recent total occlusion of the proximal to mid LAD.  - 70% ramus intermedius, 65% ostial circumflex followed by 70% mid circumflex, 75% proximal LAD prior to the origin of a small to moderate size diagonal, and 90% mid PDA. - Mild PAH, PCWP 14 - LVEF 10-15% Hemodynamics RHC Procedural Findings: Hemodynamics (mmHg) RA mean 4 RV 39/1 PA 37/12 PCWP 14 AO 115/78 Cardiac Output (Fick) 5.63 Cardiac Index (Fick) 2.71  Past Medical History:  Diagnosis Date  . Acute systolic congestive heart failure ([place]) 04/22/2016  . Diabetes mellitus without complication ([place])   . Hypertension   . Tobacco use disorder 11/21/2014    Current Outpatient Medications    Medication Sig Dispense Refill  . aspirin 325 MG tablet Take 1 tablet (325 mg total) by mouth daily. 30 tablet 0  . atorvastatin (LIPITOR) 80 MG tablet TAKE ONE TABLET BY MOUTH DAILY AT 6 PM. 30 tablet 2  . carvedilol (COREG) 6.25 MG tablet TAKE 1 TABLET BY MOUTH TWICE DAILY WITH A MEAL 60 tablet 4  . furosemide (LASIX) 20 MG tablet Take 1 tablet (20 mg total) by mouth as needed for fluid. 30 tablet 11  . isosorbide-hydrALAZINE (BIDIL) 20-37.5 MG tablet Take 1 tablet by mouth 3 (three) times daily.    . sacubitril-valsartan (ENTRESTO) 97-103 MG Take 1 tablet by mouth 2 (two) times daily. 180 tablet 3  . spironolactone (ALDACTONE) 25 MG tablet TAKE 1/2 (ONE-HALF) TABLET BY MOUTH ONCE DAILY 15 tablet 5  . albuterol (PROVENTIL HFA;VENTOLIN HFA) 108 (90 Base) MCG/ACT inhaler Inhale 2 puffs into the lungs every 6 (six) hours as needed for wheezing or shortness of breath.    . Alcohol Swabs PADS 1 Package by Does not apply route as needed (with blood glucose checks). 1 each 0  . glipiZIDE (GLUCOTROL XL) 10 MG 24 hr tablet Take 20 mg by mouth daily with breakfast.     . glucose blood (CHOICE DM FORA G20 TEST STRIPS) test strip Use as instructed 100 each 11  . glucose monitoring kit (FREESTYLE) monitoring kit 1 each by Does not apply route as needed for other. 1 each 0  . Lancets (FREESTYLE)  lancets Use as instructed 100 each 11  . metFORMIN (GLUCOPHAGE-XR) 500 MG 24 hr tablet Take 1,000 mg by mouth 2 (two) times daily.    . sildenafil (VIAGRA) 100 MG tablet Take 0.5-1 tablets (50-100 mg total) by mouth daily as needed for erectile dysfunction. Do not take your Bidil on days you use Viagra. 10 tablet 0   No current facility-administered medications for this encounter.     Allergies  Allergen Reactions  . Januvia [Sitagliptin] Hives  . Nickel Rash     Social History   Socioeconomic History  . Marital status: Married    Spouse name: Not on file  . Number of children: Not on file  . Years of  education: Not on file  . Highest education level: Not on file  Occupational History  . Not on file  Social Needs  . Financial resource strain: Not on file  . Food insecurity:    Worry: Not on file    Inability: Not on file  . Transportation needs:    Medical: Not on file    Non-medical: Not on file  Tobacco Use  . Smoking status: Former Smoker    Packs/day: 1.00    Years: 24.00    Pack years: 24.00    Types: Cigarettes    Last attempt to quit: 03/27/2016    Years since quitting: 1.6  . Smokeless tobacco: Never Used  Substance and Sexual Activity  . Alcohol use: Yes  . Drug use: No  . Sexual activity: Not on file  Lifestyle  . Physical activity:    Days per week: Not on file    Minutes per session: Not on file  . Stress: Not on file  Relationships  . Social connections:    Talks on phone: Not on file    Gets together: Not on file    Attends religious service: Not on file    Active member of club or organization: Not on file    Attends meetings of clubs or organizations: Not on file    Relationship status: Not on file  . Intimate partner violence:    Fear of current or ex partner: Not on file    Emotionally abused: Not on file    Physically abused: Not on file    Forced sexual activity: Not on file  Other Topics Concern  . Not on file  Social History Narrative  . Not on file   Family History  Problem Relation Age of Onset  . Diabetes Mellitus II Mother     Vitals:   12/02/17 0843  BP: (!) 178/104  Pulse: 68  SpO2: 100%  Weight: 220 lb (99.8 kg)   Wt Readings from Last 3 Encounters:  12/02/17 220 lb (99.8 kg)  11/24/17 220 lb (99.8 kg)  05/22/17 217 lb 12.8 oz (98.8 kg)   PHYSICAL EXAM: General:  Well appearing. No resp difficulty HEENT: normal Neck: supple. no JVD. Carotids 2+ bilat; no bruits. No lymphadenopathy or thryomegaly appreciated. Cor: PMI nondisplaced. Regular rate & rhythm. No rubs, gallops or murmurs. Lungs: clear Abdomen: soft,  nontender, nondistended. No hepatosplenomegaly. No bruits or masses. Good bowel sounds. Extremities: no cyanosis, clubbing, rash, edema Neuro: alert & orientedx3, cranial nerves grossly intact. moves all 4 extremities w/o difficulty. Affect pleasant  ASSESSMENT & PLAN:  1. Chronic combined systolic and diastolic CHF.  ICM with Echo 04/2016 LVEF 25-30%, Grade 2 DD (EF 10-15% by cath) - Cath with chronic occlusion of LAD with no viability on  MRI. Moderate non-obstructive CAD elsewhere - cMRI 07/16/16 -> recalculated 08/2016 EF 41%. Anterior scar with no significant viability Repeat echo was set up last visit but he didn't have insurance. He now has insurance so plan to repeat ECHO next month.  -NYHA II. Volume status stable. I have asked him to continue lasix as needed. He was instructed to weigh daily and take 20 mg lasix for 3 pound weight gain.   - Continue Coreg 6.25 mg BID. Historically up-titration has been limited by bradycardia.  - Continue Entresto to 97/148m BID.  - Increase spiro to 25 mg daily.  -Stop bidil due to cost. Start hydralazine 50 mg tid + imdur 30 mg daily.  2. CAD via [place]12/2017 - Chronic occlusion of LAD with no viability on MRI. Moderate non-obstructive CAD elsewhere - No s/s ischemia.  - Continue ASA and statin 3. HTN -Elevated but has been out of bidil. Sswtiching to  4. DMII - Per PCP.  5. Tobacco abuse, quit 2017  BMET today and in 7 days. Follow up next month with Dr BHaroldine Lawsand an ECHO.   Discussed medication changes.     ADarrick Grinder NP  8:49 AM

## 2017-12-03 ENCOUNTER — Other Ambulatory Visit (HOSPITAL_COMMUNITY): Payer: Self-pay | Admitting: *Deleted

## 2017-12-03 MED ORDER — POTASSIUM CHLORIDE CRYS ER 20 MEQ PO TBCR: 40.0000 meq | EXTENDED_RELEASE_TABLET | Freq: Every day | ORAL | 3 refills | Status: DC

## 2017-12-08 ENCOUNTER — Telehealth (HOSPITAL_COMMUNITY): Payer: Self-pay | Admitting: *Deleted

## 2017-12-08 MED ORDER — HYDRALAZINE HCL 50 MG PO TABS: ORAL_TABLET | ORAL | 3 refills | Status: DC

## 2017-12-08 NOTE — Telephone Encounter (Signed)
He can increase hydralazine to 75 mg TID ( 1 and one half tablets).  Please order a new prescription to cover the difference.  Thanks!!   Legrand Como [address]" [place], PA-C 12/08/2017 8:48 AM

## 2017-12-08 NOTE — Telephone Encounter (Signed)
Pt aware and agreeable with plan. New script sent to pharmacy.

## 2017-12-08 NOTE — Telephone Encounter (Signed)
Pt called stating his bp is still high this morning it was 156/96 yesterday it was 164/101 (both readings 2 hrs after taking bp medication). Pt denies any symptoms or headaches. Wants to know if he needs to make any other medication changes.   Message routed to [place] for advice

## 2017-12-11 ENCOUNTER — Ambulatory Visit (HOSPITAL_COMMUNITY)
Admission: RE | Admit: 2017-12-11 | Discharge: 2017-12-11 | Disposition: A | Discharge status: Home / Self Care | Payer: 59 | Source: Ambulatory Visit | Attending: Cardiology | Admitting: Cardiology

## 2017-12-11 DIAGNOSIS — I5020 Unspecified systolic (congestive) heart failure: Secondary | ICD-10-CM | POA: Diagnosis not present

## 2017-12-11 LAB — BASIC METABOLIC PANEL
Anion gap: 9 (ref 5–15)
BUN: 9 mg/dL (ref 6–20)
CO2: 25 mmol/L (ref 22–32)
CREATININE: 1.08 mg/dL (ref 0.61–1.24)
Calcium: 9.4 mg/dL (ref 8.9–10.3)
Chloride: 109 mmol/L (ref 98–111)
GFR calc non Af Amer: >60 mL/min (ref >60)
GLUCOSE: 92 mg/dL (ref 70–99)
Potassium: 4.2 mmol/L (ref 3.5–5.1)
Sodium: 143 mmol/L (ref 135–145)

## 2017-12-23 ENCOUNTER — Other Ambulatory Visit: Payer: Self-pay

## 2017-12-23 ENCOUNTER — Ambulatory Visit (HOSPITAL_COMMUNITY)
Admission: RE | Admit: 2017-12-23 | Discharge: 2017-12-23 | Disposition: A | Discharge status: Home / Self Care | Payer: 59 | Source: Ambulatory Visit | Attending: Internal Medicine | Admitting: Internal Medicine

## 2017-12-23 ENCOUNTER — Encounter (HOSPITAL_COMMUNITY): Payer: Self-pay | Admitting: Internal Medicine

## 2017-12-23 VITALS — BP 138/84 | HR 60 | Wt 216.2 lb

## 2017-12-23 DIAGNOSIS — Z7982 Long term (current) use of aspirin: Secondary | ICD-10-CM | POA: Insufficient documentation

## 2017-12-23 DIAGNOSIS — E119 Type 2 diabetes mellitus without complications: Secondary | ICD-10-CM | POA: Insufficient documentation

## 2017-12-23 DIAGNOSIS — Z833 Family history of diabetes mellitus: Secondary | ICD-10-CM | POA: Diagnosis not present

## 2017-12-23 DIAGNOSIS — I2510 Atherosclerotic heart disease of native coronary artery without angina pectoris: Secondary | ICD-10-CM | POA: Insufficient documentation

## 2017-12-23 DIAGNOSIS — Z8249 Family history of ischemic heart disease and other diseases of the circulatory system: Secondary | ICD-10-CM | POA: Insufficient documentation

## 2017-12-23 DIAGNOSIS — I110 Hypertensive heart disease with heart failure: Secondary | ICD-10-CM | POA: Insufficient documentation

## 2017-12-23 DIAGNOSIS — R001 Bradycardia, unspecified: Secondary | ICD-10-CM | POA: Diagnosis not present

## 2017-12-23 DIAGNOSIS — Z7984 Long term (current) use of oral hypoglycemic drugs: Secondary | ICD-10-CM | POA: Insufficient documentation

## 2017-12-23 DIAGNOSIS — I5022 Chronic systolic (congestive) heart failure: Secondary | ICD-10-CM | POA: Insufficient documentation

## 2017-12-23 DIAGNOSIS — Z79899 Other long term (current) drug therapy: Secondary | ICD-10-CM | POA: Insufficient documentation

## 2017-12-23 NOTE — Progress Notes (Signed)
  Echocardiogram 2D Echocardiogram has been performed.  Christopher Roth 12/23/2017, 12:10 PM

## 2017-12-23 NOTE — Patient Instructions (Signed)
You have been referred to Dr Lovena Le to discuss getting a defibrillator  We will contact you in 6 months to schedule your next appointment.   Cardioverter Defibrillator Implantation An implantable cardioverter defibrillator (ICD) is a small device that is placed under the skin in the chest or abdomen. An ICD consists of a battery, a small computer (pulse generator), and wires (leads) that go into the heart. An ICD is used to detect and correct two types of dangerous irregular heartbeats (arrhythmias):  A rapid heart rhythm (tachycardia).  An arrhythmia in which the lower chambers of the heart (ventricles) contract in an uncoordinated way (fibrillation).  When an ICD detects tachycardia, it sends a low-energy shock to the heart to restore the heartbeat to normal (cardioversion). This signal is usually painless. If cardioversion does not work or if the ICD detects fibrillation, it delivers a high-energy shock to the heart (defibrillation) to restart the heart. This shock may feel like a strong jolt in the chest. Your health care provider may prescribe an ICD if:  You have had an arrhythmia that originated in the ventricles.  Your heart has been damaged by a disease or heart condition.  Sometimes, ICDs are programmed to act as a device called a pacemaker. Pacemakers can be used to treat a slow heartbeat (bradycardia) or tachycardia by taking over the heart rate with electrical impulses. Tell a health care provider about:  Any allergies you have.  All medicines you are taking, including vitamins, herbs, eye drops, creams, and over-the-counter medicines.  Any problems you or family members have had with anesthetic medicines.  Any blood disorders you have.  Any surgeries you have had.  Any medical conditions you have.  Whether you are pregnant or may be pregnant. What are the risks? Generally, this is a safe procedure. However, problems may occur, including:  Swelling, bleeding, or  bruising.  Infection.  Blood clots.  Damage to other structures or organs, such as nerves, blood vessels, or the heart.  Allergic reactions to medicines used during the procedure.  What happens before the procedure? Staying hydrated Follow instructions from your health care provider about hydration, which may include:  Up to 2 hours before the procedure - you may continue to drink clear liquids, such as water, clear fruit juice, black coffee, and plain tea.  Eating and drinking restrictions Follow instructions from your health care provider about eating and drinking, which may include:  8 hours before the procedure - stop eating heavy meals or foods such as meat, fried foods, or fatty foods.  6 hours before the procedure - stop eating light meals or foods, such as toast or cereal.  6 hours before the procedure - stop drinking milk or drinks that contain milk.  2 hours before the procedure - stop drinking clear liquids.  Medicine Ask your health care provider about:  Changing or stopping your normal medicines. This is important if you take diabetes medicines or blood thinners.  Taking medicines such as aspirin and ibuprofen. These medicines can thin your blood. Do not take these medicines before your procedure if your doctor tells you not to.  Tests  You may have blood tests.  You may have a test to check the electrical signals in your heart (electrocardiogram, ECG).  You may have imaging tests, such as a chest X-ray. General instructions  For 24 hours before the procedure, stop using products that contain nicotine or tobacco, such as cigarettes and e-cigarettes. If you need help quitting, ask your  health care provider.  Plan to have someone take you home from the hospital or clinic.  You may be asked to shower with a germ-killing soap. What happens during the procedure?  To reduce your risk of infection: ? Your health care team will wash or sanitize their  hands. ? Your skin will be washed with soap. ? Hair may be removed from the surgical area.  Small monitors will be put on your body. They will be used to check your heart, blood pressure, and oxygen level.  An IV tube will be inserted into one of your veins.  You will be given one or more of the following: ? A medicine to help you relax (sedative). ? A medicine to numb the area (local anesthetic). ? A medicine to make you fall asleep (general anesthetic).  Leads will be guided through a blood vessel into your heart and attached to your heart muscles. Depending on the ICD, the leads may go into one ventricle or they may go into both ventricles and into an upper chamber of the heart. An X-ray machine (fluoroscope) will be usedto help guide the leads.  A small incision will be made to create a deep pocket under your skin.  The pulse generator will be placed into the pocket.  The ICD will be tested.  The incision will be closed with stitches (sutures), skin glue, or staples.  A bandage (dressing) will be placed over the incision. This procedure may vary among health care providers and hospitals. What happens after the procedure?  Your blood pressure, heart rate, breathing rate, and blood oxygen level will be monitored often until the medicines you were given have worn off.  A chest X-ray will be taken to check that the ICD is in the right place.  You will need to stay in the hospital for 1-2 days so your health care provider can make sure your ICD is working.  Do not drive for 24 hours if you received a sedative. Ask your health care provider when it is safe for you to drive.  You may be given an identification card explaining that you have an ICD. Summary  An implantable cardioverter defibrillator (ICD) is a small device that is placed under the skin in the chest or abdomen. It is used to detect and correct dangerous irregular heartbeats (arrhythmias).  An ICD consists of a  battery, a small computer (pulse generator), and wires (leads) that go into the heart.  When an ICD detects rapid heart rhythm (tachycardia), it sends a low-energy shock to the heart to restore the heartbeat to normal (cardioversion). If cardioversion does not work or if the ICD detects uncoordinated heart contractions (fibrillation), it delivers a high-energy shock to the heart (defibrillation) to restart the heart.  You will need to stay in the hospital for 1-2 days to make sure your ICD is working. This information is not intended to replace advice given to you by your health care provider. Make sure you discuss any questions you have with your health care provider. Document Released: 01/11/2002 Document Revised: 04/30/2016 Document Reviewed: 04/30/2016 Elsevier Interactive Patient Education  2017 [employer].

## 2017-12-23 NOTE — Addendum Note (Signed)
Encounter addended by: Scarlette Calico, RN on: 12/23/2017 12:59 PM  Actions taken: Order list changed, Diagnosis association updated, Sign clinical note

## 2017-12-23 NOTE — Progress Notes (Signed)
Advanced Heart Failure Clinic Note   Referring Physician: Crenshaw Primary Care: [name], MD at [facility] family Primary Cardiologist: Dr Stanford Breed HF: Dr. [name]  HPI: Christopher Roth is a 47 y.o. male with PMH of HTN, DM2, Tobacco abuse, CAD, and systolic CHF.   Admitted 04/20/16 -> 04/25/16 with new onest HF symptoms. Echo 04/21/16 LVEF 25-30%, Grade 2 DD. Diuresed 12 lbs. LHC with significant CAD as below.   Evaluated 11/25/17 in ED with chest pain. Not thouught to be ACS. Troponin 0.0. BNP 1397. He was instructed to double lasix for 3 days and follow up with HF.  He presents today for regular follow up. Feeling good overall. BP at home running 120-130s since hydralazine. Denies SOB/PND/Orthopnea. Takes lasix 1-2 times a month. Appetite has been great, so feels like he is occasionally holding on to more fluid. Denies any CP. Denies lightheadedness or dizziness. Taking all medications as directed. Working full time in [employer], mostly in office.   Social: Lives in [place], Wife and Son  Family history: Brother, 55, has pacemaker -> strokes and vascular disease Mother: HTN, Diabetes Father: Diabetes  cMRI 07/16/16 -> recalculated 08/19/16: EF 41% scar in LAD distribution. No viability   LHC 04/23/16 - Recent total occlusion of the proximal to mid LAD.  - 70% ramus intermedius, 65% ostial circumflex followed by 70% mid circumflex, 75% proximal LAD prior to the origin of a small to moderate size diagonal, and 90% mid PDA. - Mild PAH, PCWP 14 - LVEF 10-15% Hemodynamics RHC Procedural Findings: Hemodynamics (mmHg) RA mean 4 RV 39/1 PA 37/12 PCWP 14 AO 115/78 Cardiac Output (Fick) 5.63 Cardiac Index (Fick) 2.71  Past Medical History:  Diagnosis Date  . Acute systolic congestive heart failure ([place]) 04/22/2016  . Diabetes mellitus without complication ([place])   . Hypertension   . Tobacco use disorder 11/21/2014    Current Outpatient Medications  Medication Sig  Dispense Refill  . albuterol (PROVENTIL HFA;VENTOLIN HFA) 108 (90 Base) MCG/ACT inhaler Inhale 2 puffs into the lungs every 6 (six) hours as needed for wheezing or shortness of breath.    . Alcohol Swabs PADS 1 Package by Does not apply route as needed (with blood glucose checks). 1 each 0  . aspirin 325 MG tablet Take 1 tablet (325 mg total) by mouth daily. 30 tablet 0  . atorvastatin (LIPITOR) 80 MG tablet TAKE ONE TABLET BY MOUTH DAILY AT 6 PM. 30 tablet 2  . carvedilol (COREG) 6.25 MG tablet TAKE 1 TABLET BY MOUTH TWICE DAILY WITH A MEAL 60 tablet 4  . furosemide (LASIX) 20 MG tablet Take 1 tablet (20 mg total) by mouth as needed for fluid. 30 tablet 11  . glipiZIDE (GLUCOTROL XL) 10 MG 24 hr tablet Take 20 mg by mouth daily with breakfast.     . glucose blood (CHOICE DM FORA G20 TEST STRIPS) test strip Use as instructed 100 each 11  . glucose monitoring kit (FREESTYLE) monitoring kit 1 each by Does not apply route as needed for other. 1 each 0  . hydrALAZINE (APRESOLINE) 50 MG tablet Take 80m ( 1 & 1/2 tablets) three times daily. 135 tablet 3  . isosorbide mononitrate (IMDUR) 30 MG 24 hr tablet Take 1 tablet (30 mg total) by mouth daily. 30 tablet 11  . Lancets (FREESTYLE) lancets Use as instructed 100 each 11  . metFORMIN (GLUCOPHAGE-XR) 500 MG 24 hr tablet Take 1,000 mg by mouth 2 (two) times daily.    . potassium chloride SA (  K-DUR,KLOR-CON) 20 MEQ tablet Take 2 tablets (40 mEq total) by mouth daily. 60 tablet 3  . sacubitril-valsartan (ENTRESTO) 97-103 MG Take 1 tablet by mouth 2 (two) times daily. 180 tablet 3  . sildenafil (VIAGRA) 100 MG tablet Take 0.5-1 tablets (50-100 mg total) by mouth daily as needed for erectile dysfunction. Do not take your Bidil on days you use Viagra. 10 tablet 0  . spironolactone (ALDACTONE) 25 MG tablet Take 1 tablet (25 mg total) by mouth daily. 30 tablet 11   No current facility-administered medications for this encounter.     Allergies  Allergen  Reactions  . Januvia [Sitagliptin] Hives  . Nickel Rash     Social History   Socioeconomic History  . Marital status: Married    Spouse name: Not on file  . Number of children: Not on file  . Years of education: Not on file  . Highest education level: Not on file  Occupational History  . Not on file  Social Needs  . Financial resource strain: Not on file  . Food insecurity:    Worry: Not on file    Inability: Not on file  . Transportation needs:    Medical: Not on file    Non-medical: Not on file  Tobacco Use  . Smoking status: Former Smoker    Packs/day: 1.00    Years: 24.00    Pack years: 24.00    Types: Cigarettes    Last attempt to quit: 03/27/2016    Years since quitting: 1.7  . Smokeless tobacco: Never Used  Substance and Sexual Activity  . Alcohol use: Yes  . Drug use: No  . Sexual activity: Not on file  Lifestyle  . Physical activity:    Days per week: Not on file    Minutes per session: Not on file  . Stress: Not on file  Relationships  . Social connections:    Talks on phone: Not on file    Gets together: Not on file    Attends religious service: Not on file    Active member of club or organization: Not on file    Attends meetings of clubs or organizations: Not on file    Relationship status: Not on file  . Intimate partner violence:    Fear of current or ex partner: Not on file    Emotionally abused: Not on file    Physically abused: Not on file    Forced sexual activity: Not on file  Other Topics Concern  . Not on file  Social History Narrative  . Not on file   Family History  Problem Relation Age of Onset  . Diabetes Mellitus II Mother     Vitals:   12/23/17 1200  BP: 138/84  Pulse: 60  SpO2: 100%  Weight: 98.1 kg (216 lb 4 oz)   Wt Readings from Last 3 Encounters:  12/23/17 98.1 kg (216 lb 4 oz)  12/02/17 99.8 kg (220 lb)  11/24/17 99.8 kg (220 lb)   PHYSICAL EXAM: General: Well appearing. No resp difficulty. HEENT: Normal  anicteric. Neck: Supple. JVP 5-6. Carotids 2+ bilat; no bruits. No thyromegaly or nodule noted. Cor: PMI nondisplaced. RRR, No M/G/R noted Lungs: CTAB, normal effort. wheeze Abdomen: Soft, non-tender, non-distended, no HSM. No bruits or masses. +BS  Extremities: no cyanosis, clubbing, rash, edema Neuro: alert & oriented x 3, cranial nerves grossly intact. moves all 4 extremities w/o difficulty. Affect pleasant   ASSESSMENT & PLAN:  1. Chronic combined systolic and  diastolic CHF -  ICM with Echo 04/2016 LVEF 25-30%, Grade 2 DD (EF 10-15% by cath) - Cath with chronic occlusion of LAD with no viability on MRI. Moderate non-obstructive CAD elsewhere - cMRI 07/16/16 -> recalculated 08/2016 EF 41%. Anterior scar with no significant viability - Echo today shows EF 25-30 with apical AK. RV normale - NYHA I-II symptoms. Denies DOE. - Volume status looks stable on exam.   - Continue lasix as needed.  - Continue Coreg 6.25 mg BID. Historically up-titration has been limited by bradycardia.  - Continue Entresto to 97/152m BID.  - Continue spiro 25 mg daily.  - Continue hydralazine 75 mg TID - Continue imdur 30 mg daily.  2. [place] - Chronic occlusion of LAD with no viability on MRI. Moderate non-obstructive CAD elsewhere - No s/s of ischemia.    - Continue ASA and statin 3. HTN - Improved with med adjustments.  4. DMII - Per PCP.   5. Tobacco abuse - Quit 2017  MShirley Friar PA-C  12:05 PM  Patient seen and examined with the above-signed Advanced Practice Provider and/or Housestaff. I personally reviewed laboratory data, imaging studies and relevant notes. I independently examined the patient and formulated the important aspects of the plan. I have edited the note to reflect any of my changes or salient points. I have personally discussed the plan with the patient and/or family.  Doing well. Echo reviewed personally RF 25-30%. NYHA I-II on excellent meds. Volume  status looks good. Discussed need for ICD. Will refer to Dr. THall Busingfor [employer] device.   DGlori Bickers MD  12:45 PM

## 2018-01-01 ENCOUNTER — Ambulatory Visit: Payer: 59 | Admitting: Internal Medicine

## 2018-01-01 ENCOUNTER — Encounter (INDEPENDENT_AMBULATORY_CARE_PROVIDER_SITE_OTHER): Payer: Self-pay

## 2018-01-01 VITALS — BP 118/80 | HR 66 | Ht 68.0 in | Wt 218.4 lb

## 2018-01-01 DIAGNOSIS — I5022 Chronic systolic (congestive) heart failure: Secondary | ICD-10-CM | POA: Diagnosis not present

## 2018-01-01 DIAGNOSIS — Z01812 Encounter for preprocedural laboratory examination: Secondary | ICD-10-CM | POA: Diagnosis not present

## 2018-01-01 LAB — BASIC METABOLIC PANEL
BUN / CREAT RATIO: 18 (ref 9–20)
BUN: 23 mg/dL (ref 6–24)
CALCIUM: 9.9 mg/dL (ref 8.7–10.2)
CO2: 20 mmol/L (ref 20–29)
CREATININE: 1.3 mg/dL — AB (ref 0.76–1.27)
Chloride: 108 mmol/L — ABNORMAL HIGH (ref 96–106)
GFR calc non Af Amer: 65 mL/min/{1.73_m2} (ref >59)
GFR, EST AFRICAN AMERICAN: 75 mL/min/{1.73_m2} (ref >59)
Glucose: 104 mg/dL — ABNORMAL HIGH (ref 65–99)
Potassium: 5.3 mmol/L — ABNORMAL HIGH (ref 3.5–5.2)
SODIUM: 142 mmol/L (ref 134–144)

## 2018-01-01 LAB — CBC
HEMATOCRIT: 36.8 % — AB (ref 37.5–51.0)
Hemoglobin: 11.6 g/dL — ABNORMAL LOW (ref 13.0–17.7)
MCH: 26.4 pg — ABNORMAL LOW (ref 26.6–33.0)
MCHC: 31.5 g/dL (ref 31.5–35.7)
MCV: 84 fL (ref 79–97)
Platelets: 300 10*3/uL (ref 150–450)
RBC: 4.4 x10E6/uL (ref 4.14–5.80)
RDW: 16.3 % — AB (ref 12.3–15.4)
WBC: 7.2 10*3/uL (ref 3.4–10.8)

## 2018-01-01 NOTE — H&P (View-Only) (Signed)
ELECTROPHYSIOLOGY CONSULT NOTE  Patient ID: Christopher Roth, MRN: [id number], DOB/AGE: 47-Apr-1972 47 y.o. Admit date: (Not on file) Date of Consult: 01/01/2018  Primary Physician: Helane Rima, MD Primary Cardiologist: DB CHF     Christopher Roth is a 47 y.o. male who is being seen today for the evaluation of ICD at the request of DB.    HPI Christopher Roth is a 47 y.o. male with ischemic cardiomyopathy and persistent LF dysfunction He has had episodes of chest pain, but no evidence of MI No DOE, PND, Edema  Takes lasix infrequently Able to do full work without difficulty  No palps  No syncope.   DATE TEST EF   12/17 LHC 10.15% LAD T; residual 3 V CAD  12/17.e  Echo   25-30 %   3/18 cMRI  41 %  full thickness scar Anterior wall  81/9 Echo  25-30%       Past Medical History:  Diagnosis Date  . Acute systolic congestive heart failure ([place]) 04/22/2016  . Diabetes mellitus without complication ([place])   . Hypertension   . Tobacco use disorder 11/21/2014      Surgical History:  Past Surgical History:  Procedure Laterality Date  . CARDIAC CATHETERIZATION N/A 04/23/2016   Procedure: Right/Left Heart Cath and Coronary Angiography;  Surgeon: Belva Crome, MD;  Location: [facility];  Service: Cardiovascular;  Laterality: N/A;  . INCISION AND DRAINAGE PERIRECTAL ABSCESS Right 11/17/2014   Procedure: debridement of right perianal and groin wound;  Surgeon: Johnathan Hausen, MD;  Location: [facility];  Service: General;  Laterality: Right;     Home Meds: Prior to Admission medications   Medication Sig Start Date End Date Taking? Authorizing Provider  albuterol (PROVENTIL HFA;VENTOLIN HFA) 108 (90 Base) MCG/ACT inhaler Inhale 2 puffs into the lungs every 6 (six) hours as needed for wheezing or shortness of breath.   Yes [provider]  Alcohol Swabs PADS 1 Package by Does not apply route as needed (with blood glucose checks). 11/21/14  Yes Robbie Lis, MD  aspirin  325 MG tablet Take 1 tablet (325 mg total) by mouth daily. 04/26/16  Yes Verlee Monte, MD  atorvastatin (LIPITOR) 80 MG tablet TAKE ONE TABLET BY MOUTH DAILY AT 6 PM. 06/22/17  Yes Bensimhon, Shaune Pascal, MD  carvedilol (COREG) 6.25 MG tablet TAKE 1 TABLET BY MOUTH TWICE DAILY WITH A MEAL 11/30/17  Yes Bensimhon, Shaune Pascal, MD  furosemide (LASIX) 20 MG tablet Take 1 tablet (20 mg total) by mouth as needed for fluid. 08/05/17  Yes Bensimhon, Shaune Pascal, MD  glipiZIDE (GLUCOTROL XL) 10 MG 24 hr tablet Take 20 mg by mouth daily with breakfast.    Yes [provider]  glucose blood (CHOICE DM FORA G20 TEST STRIPS) test strip Use as instructed 11/21/14  Yes Robbie Lis, MD  glucose monitoring kit (FREESTYLE) monitoring kit 1 each by Does not apply route as needed for other. 11/21/14  Yes Robbie Lis, MD  hydrALAZINE (APRESOLINE) 50 MG tablet Take 67m ( 1 & 1/2 tablets) three times daily. 12/08/17  Yes TShirley Friar PA-C  isosorbide mononitrate (IMDUR) 30 MG 24 hr tablet Take 1 tablet (30 mg total) by mouth daily. 12/02/17 12/02/18 Yes Clegg, Amy D, NP  Lancets (FREESTYLE) lancets Use as instructed 11/21/14  Yes DRobbie Lis MD  metFORMIN (GLUCOPHAGE-XR) 500 MG 24 hr tablet Take 1,000 mg by mouth 2 (two) times daily.   Yes [provider]  potassium chloride SA (K-DUR,KLOR-CON) 20 MEQ tablet Take 2 tablets (40 mEq total) by mouth daily. 12/03/17  Yes Bensimhon, Shaune Pascal, MD  sacubitril-valsartan (ENTRESTO) 97-103 MG Take 1 tablet by mouth 2 (two) times daily. 10/19/17  Yes Bensimhon, Shaune Pascal, MD  spironolactone (ALDACTONE) 25 MG tablet Take 1 tablet (25 mg total) by mouth daily. 12/02/17  Yes Clegg, Amy D, NP    Allergies:  Allergies  Allergen Reactions  . Januvia [Sitagliptin] Hives  . Nickel Rash    Social History   Socioeconomic History  . Marital status: Married    Spouse name: Not on file  . Number of children: Not on file  . Years of education: Not on file  . Highest  education level: Not on file  Occupational History  . Not on file  Social Needs  . Financial resource strain: Not on file  . Food insecurity:    Worry: Not on file    Inability: Not on file  . Transportation needs:    Medical: Not on file    Non-medical: Not on file  Tobacco Use  . Smoking status: Former Smoker    Packs/day: 1.00    Years: 24.00    Pack years: 24.00    Types: Cigarettes    Last attempt to quit: 03/27/2016    Years since quitting: 1.7  . Smokeless tobacco: Never Used  Substance and Sexual Activity  . Alcohol use: Yes  . Drug use: No  . Sexual activity: Not on file  Lifestyle  . Physical activity:    Days per week: Not on file    Minutes per session: Not on file  . Stress: Not on file  Relationships  . Social connections:    Talks on phone: Not on file    Gets together: Not on file    Attends religious service: Not on file    Active member of club or organization: Not on file    Attends meetings of clubs or organizations: Not on file    Relationship status: Not on file  . Intimate partner violence:    Fear of current or ex partner: Not on file    Emotionally abused: Not on file    Physically abused: Not on file    Forced sexual activity: Not on file  Other Topics Concern  . Not on file  Social History Narrative  . Not on file     Family History  Problem Relation Age of Onset  . Diabetes Mellitus II Mother      ROS:  Please see the history of present illness.     All other systems reviewed and negative.    Physical Exam: Blood pressure 118/80, pulse 66, height 5' 8"  (1.727 m), weight 218 lb 6.4 oz (99.1 kg). General: Well developed, well nourished male in no acute distress. Head: Normocephalic, atraumatic, sclera non-icteric, no xanthomas, nares are without discharge. EENT: normal  Lymph Nodes:  none Neck: Negative for carotid bruits. JVD not elevated. Back:without scoliosis kyphosis Lungs: Clear bilaterally to auscultation without  wheezes, rales, or rhonchi. Breathing is unlabored. Heart: RRR with S1 S2. No  murmur . No rubs, or gallops appreciated. Abdomen: Soft, non-tender, non-distended with normoactive bowel sounds. No hepatomegaly. No rebound/guarding. No obvious abdominal masses. Msk:  Strength and tone appear normal for age. Extremities: No clubbing or cyanosis. No edema.  Distal pedal pulses are 2+ and equal bilaterally. Skin: Warm and Dry Neuro: Alert and oriented X 3. CN III-XII  intact Grossly normal sensory and motor function . Psych:  Responds to questions appropriately with a normal affect.      Labs: Cardiac Enzymes No results for input(s): CKTOTAL, CKMB, TROPONINI in the last 72 hours. CBC Lab Results  Component Value Date   WBC 6.3 11/24/2017   HGB 10.0 (L) 11/24/2017   HCT 31.9 (L) 11/24/2017   MCV 84.2 11/24/2017   PLT 331 11/24/2017   PROTIME: No results for input(s): LABPROT, INR in the last 72 hours. Chemistry No results for input(s): NA, K, CL, CO2, BUN, CREATININE, CALCIUM, PROT, BILITOT, ALKPHOS, ALT, AST, GLUCOSE in the last 168 hours.  Invalid input(s): LABALBU Lipids Lab Results  Component Value Date   CHOL 193 04/22/2016   HDL 52 04/22/2016   LDLCALC 125 (H) 04/22/2016   TRIG 80 04/22/2016   BNP No results found for: PROBNP Thyroid Function Tests: No results for input(s): TSH, T4TOTAL, T3FREE, THYROIDAB in the last 72 hours.  Invalid input(s): FREET3 Miscellaneous Lab Results  Component Value Date   DDIMER 1.32 (H) 04/21/2016    Radiology/Studies:  No results found.  EKG: Sinus with narrow QRS   Assessment and Plan:  Ischemic cardiomyopathy  Congestive heart failure class I-2  HTN  DM  Patient with persistent left ventricular dysfunction despite optimal medical therapy.  With an ejection fraction sub-30% not withstanding his heart failure status, it is a appropriate indication for ICD implantation.  His young age I think supports this  recommendation.  I will review with Dr. Reine Just treatment options, specifically S ICD versus transvenous device with [employer].  Have reviewed the potential benefits and risks of ICD implantation including but not limited to death, perforation of heart or lung, lead dislodgement, infection,  device malfunction and inappropriate shocks.  The patient express understanding  and are willing to proceed.         Virl Axe

## 2018-01-01 NOTE — Progress Notes (Signed)
ELECTROPHYSIOLOGY CONSULT NOTE  Patient ID: Christopher Roth, MRN: [id number], DOB/AGE: 1970-06-04 47 y.o. Admit date: (Not on file) Date of Consult: 01/01/2018  Primary Physician: Helane Rima, MD Primary Cardiologist: DB CHF     Christopher Roth is a 47 y.o. male who is being seen today for the evaluation of ICD at the request of DB.    HPI Christopher Roth is a 47 y.o. male with ischemic cardiomyopathy and persistent LF dysfunction He has had episodes of chest pain, but no evidence of MI No DOE, PND, Edema  Takes lasix infrequently Able to do full work without difficulty  No palps  No syncope.   DATE TEST EF   12/17 LHC 10.15% LAD T; residual 3 V CAD  12/17.e  Echo   25-30 %   3/18 cMRI  41 %  full thickness scar Anterior wall  81/9 Echo  25-30%       Past Medical History:  Diagnosis Date  . Acute systolic congestive heart failure ([place]) 04/22/2016  . Diabetes mellitus without complication ([place])   . Hypertension   . Tobacco use disorder 11/21/2014      Surgical History:  Past Surgical History:  Procedure Laterality Date  . CARDIAC CATHETERIZATION N/A 04/23/2016   Procedure: Right/Left Heart Cath and Coronary Angiography;  Surgeon: Belva Crome, MD;  Location: [facility];  Service: Cardiovascular;  Laterality: N/A;  . INCISION AND DRAINAGE PERIRECTAL ABSCESS Right 11/17/2014   Procedure: debridement of right perianal and groin wound;  Surgeon: Johnathan Hausen, MD;  Location: [facility];  Service: General;  Laterality: Right;     Home Meds: Prior to Admission medications   Medication Sig Start Date End Date Taking? Authorizing Provider  albuterol (PROVENTIL HFA;VENTOLIN HFA) 108 (90 Base) MCG/ACT inhaler Inhale 2 puffs into the lungs every 6 (six) hours as needed for wheezing or shortness of breath.   Yes [provider]  Alcohol Swabs PADS 1 Package by Does not apply route as needed (with blood glucose checks). 11/21/14  Yes Robbie Lis, MD  aspirin  325 MG tablet Take 1 tablet (325 mg total) by mouth daily. 04/26/16  Yes Verlee Monte, MD  atorvastatin (LIPITOR) 80 MG tablet TAKE ONE TABLET BY MOUTH DAILY AT 6 PM. 06/22/17  Yes Bensimhon, Shaune Pascal, MD  carvedilol (COREG) 6.25 MG tablet TAKE 1 TABLET BY MOUTH TWICE DAILY WITH A MEAL 11/30/17  Yes Bensimhon, Shaune Pascal, MD  furosemide (LASIX) 20 MG tablet Take 1 tablet (20 mg total) by mouth as needed for fluid. 08/05/17  Yes Bensimhon, Shaune Pascal, MD  glipiZIDE (GLUCOTROL XL) 10 MG 24 hr tablet Take 20 mg by mouth daily with breakfast.    Yes [provider]  glucose blood (CHOICE DM FORA G20 TEST STRIPS) test strip Use as instructed 11/21/14  Yes Robbie Lis, MD  glucose monitoring kit (FREESTYLE) monitoring kit 1 each by Does not apply route as needed for other. 11/21/14  Yes Robbie Lis, MD  hydrALAZINE (APRESOLINE) 50 MG tablet Take 68m ( 1 & 1/2 tablets) three times daily. 12/08/17  Yes TShirley Friar PA-C  isosorbide mononitrate (IMDUR) 30 MG 24 hr tablet Take 1 tablet (30 mg total) by mouth daily. 12/02/17 12/02/18 Yes Clegg, Amy D, NP  Lancets (FREESTYLE) lancets Use as instructed 11/21/14  Yes DRobbie Lis MD  metFORMIN (GLUCOPHAGE-XR) 500 MG 24 hr tablet Take 1,000 mg by mouth 2 (two) times daily.   Yes [provider]  potassium chloride SA (K-DUR,KLOR-CON) 20 MEQ tablet Take 2 tablets (40 mEq total) by mouth daily. 12/03/17  Yes Bensimhon, Shaune Pascal, MD  sacubitril-valsartan (ENTRESTO) 97-103 MG Take 1 tablet by mouth 2 (two) times daily. 10/19/17  Yes Bensimhon, Shaune Pascal, MD  spironolactone (ALDACTONE) 25 MG tablet Take 1 tablet (25 mg total) by mouth daily. 12/02/17  Yes Clegg, Amy D, NP    Allergies:  Allergies  Allergen Reactions  . Januvia [Sitagliptin] Hives  . Nickel Rash    Social History   Socioeconomic History  . Marital status: Married    Spouse name: Not on file  . Number of children: Not on file  . Years of education: Not on file  . Highest  education level: Not on file  Occupational History  . Not on file  Social Needs  . Financial resource strain: Not on file  . Food insecurity:    Worry: Not on file    Inability: Not on file  . Transportation needs:    Medical: Not on file    Non-medical: Not on file  Tobacco Use  . Smoking status: Former Smoker    Packs/day: 1.00    Years: 24.00    Pack years: 24.00    Types: Cigarettes    Last attempt to quit: 03/27/2016    Years since quitting: 1.7  . Smokeless tobacco: Never Used  Substance and Sexual Activity  . Alcohol use: Yes  . Drug use: No  . Sexual activity: Not on file  Lifestyle  . Physical activity:    Days per week: Not on file    Minutes per session: Not on file  . Stress: Not on file  Relationships  . Social connections:    Talks on phone: Not on file    Gets together: Not on file    Attends religious service: Not on file    Active member of club or organization: Not on file    Attends meetings of clubs or organizations: Not on file    Relationship status: Not on file  . Intimate partner violence:    Fear of current or ex partner: Not on file    Emotionally abused: Not on file    Physically abused: Not on file    Forced sexual activity: Not on file  Other Topics Concern  . Not on file  Social History Narrative  . Not on file     Family History  Problem Relation Age of Onset  . Diabetes Mellitus II Mother      ROS:  Please see the history of present illness.     All other systems reviewed and negative.    Physical Exam: Blood pressure 118/80, pulse 66, height 5' 8"  (1.727 m), weight 218 lb 6.4 oz (99.1 kg). General: Well developed, well nourished male in no acute distress. Head: Normocephalic, atraumatic, sclera non-icteric, no xanthomas, nares are without discharge. EENT: normal  Lymph Nodes:  none Neck: Negative for carotid bruits. JVD not elevated. Back:without scoliosis kyphosis Lungs: Clear bilaterally to auscultation without  wheezes, rales, or rhonchi. Breathing is unlabored. Heart: RRR with S1 S2. No  murmur . No rubs, or gallops appreciated. Abdomen: Soft, non-tender, non-distended with normoactive bowel sounds. No hepatomegaly. No rebound/guarding. No obvious abdominal masses. Msk:  Strength and tone appear normal for age. Extremities: No clubbing or cyanosis. No edema.  Distal pedal pulses are 2+ and equal bilaterally. Skin: Warm and Dry Neuro: Alert and oriented X 3. CN III-XII  intact Grossly normal sensory and motor function . Psych:  Responds to questions appropriately with a normal affect.      Labs: Cardiac Enzymes No results for input(s): CKTOTAL, CKMB, TROPONINI in the last 72 hours. CBC Lab Results  Component Value Date   WBC 6.3 11/24/2017   HGB 10.0 (L) 11/24/2017   HCT 31.9 (L) 11/24/2017   MCV 84.2 11/24/2017   PLT 331 11/24/2017   PROTIME: No results for input(s): LABPROT, INR in the last 72 hours. Chemistry No results for input(s): NA, K, CL, CO2, BUN, CREATININE, CALCIUM, PROT, BILITOT, ALKPHOS, ALT, AST, GLUCOSE in the last 168 hours.  Invalid input(s): LABALBU Lipids Lab Results  Component Value Date   CHOL 193 04/22/2016   HDL 52 04/22/2016   LDLCALC 125 (H) 04/22/2016   TRIG 80 04/22/2016   BNP No results found for: PROBNP Thyroid Function Tests: No results for input(s): TSH, T4TOTAL, T3FREE, THYROIDAB in the last 72 hours.  Invalid input(s): FREET3 Miscellaneous Lab Results  Component Value Date   DDIMER 1.32 (H) 04/21/2016    Radiology/Studies:  No results found.  EKG: Sinus with narrow QRS   Assessment and Plan:  Ischemic cardiomyopathy  Congestive heart failure class I-2  HTN  DM  Patient with persistent left ventricular dysfunction despite optimal medical therapy.  With an ejection fraction sub-30% not withstanding his heart failure status, it is a appropriate indication for ICD implantation.  His young age I think supports this  recommendation.  I will review with Dr. Reine Just treatment options, specifically S ICD versus transvenous device with Heart Logic.  Have reviewed the potential benefits and risks of ICD implantation including but not limited to death, perforation of heart or lung, lead dislodgement, infection,  device malfunction and inappropriate shocks.  The patient express understanding  and are willing to proceed.         Virl Axe

## 2018-01-01 NOTE — Patient Instructions (Addendum)
Medication Instructions:  Your physician recommends that you continue on your current medications as directed. Please refer to the Current Medication list given to you today.     * If you need a refill on your cardiac medications before your next appointment, please call your pharmacy. *   Labwork: Pre procedure lab work today: BMET & CBC w/ diff * Will notify you of abnormal results, otherwise continue current treatment plan.*   Testing/Procedures: Your physician has recommended that you have a defibrillator inserted. An implantable cardioverter defibrillator (ICD) is a small device that is placed in your chest or, in rare cases, your abdomen. This device uses electrical pulses or shocks to help control life-threatening, irregular heartbeats that could lead the heart to suddenly stop beating (sudden cardiac arrest). Leads are attached to the ICD that goes into your heart. This is done in the hospital and usually requires an overnight stay. Please follow the instructions below, located under the special instructions section.   Follow-Up: Your physician recommends that you schedule a wound check appointment 10-14 days, after your procedure on 01/27/2018, with the device clinic.  Your physician recommends that you schedule a follow up appointment in 91 days, after your procedure on 01/27/2018, with Dr. Caryl Comes.  * Please note that any paperwork needing to be filled out by the provider will need to be addressed at the front desk prior to seeing the provider.  Please note that any FMLA, disability or other documents regarding health condition is subject to a $25.00 charge that must be received prior to completion of paperwork in the form of a money order or check. *  Thank you for choosing [facility]!!    Any Other Special Instructions Will Be Listed Below (If Applicable).     Implantable Device Instructions  You are scheduled for:                  _____ Implantable Cardioverter  Defibrillator  on  01/27/2018  with Dr. Caryl Comes.  1.   Please arrive at the [facility], Entrance "A"  at [facility] at  6:30 a.m. on the day of your procedure. (The address is [address]  2. Do not eat or drink after midnight the night before your procedure.  3.   Complete pre procedure  lab work on 01/01/2018.   You do not have to be fasting.  4.   Hold all of your morning medications the morning of your procedure.  5.  Plan for an overnight stay.  Bring your insurance cards and a list of you medications.  6.  Wash your chest and neck with surgical scrub the evening before and the morning of  your procedure.  Rinse well. Please review the surgical scrub instruction sheet given to you.  7. Your chest will need to be shaved prior to this procedure (if needed). We ask that you do this yourself at home 1 to 2 days before or if uncomfortable/unable to do yourself, then it will be performed by the hospital staff the day of.                                                                                                                *  If you have ANY questions after you get home, please call Trinidad Curet, RN @ [phone].  * Every attempt is made to prevent procedures from being rescheduled.  Due to the nature of  Electrophysiology, rescheduling can happen.  The physician is always aware and directs the staff when this occurs.        Cardioverter Defibrillator Implantation An implantable cardioverter defibrillator (ICD) is a small, lightweight, battery-powered device that is placed (implanted) under the skin in the chest or abdomen. Your caregiver may prescribe an ICD if:  You have had an irregular heart rhythm (arrhythmia) that originated in the lower chambers of the heart (ventricles).  Your heart has been damaged by a disease (such as coronary artery disease) or heart condition (such as a heart attack). An ICD consists of a battery that lasts several years, a  small computer called a pulse generator, and wires called leads that go into the heart. It is used to detect and correct two dangerous arrhythmias: a rapid heart rhythm (tachycardia) and an arrhythmia in which the ventricles contract in an uncoordinated way (fibrillation). When an ICD detects tachycardia, it sends an electrical signal to the heart that restores the heartbeat to normal (cardioversion). This signal is usually painless. If cardioversion does not work or if the ICD detects fibrillation, it delivers a small electrical shock to the heart (defibrillation) to restart the heart. The shock may feel like a strong jolt in the chest. ICDs may be programmed to correct other problems. Sometimes, ICDs are programmed to act as another type of implantable device called a pacemaker. Pacemakers are used to treat a slow heartbeat (bradycardia). LET YOUR CAREGIVER KNOW ABOUT:  Any allergies you have.  All medicines you are taking, including vitamins, herbs, eyedrops, and over-the-counter medicines and creams.  Previous problems you or members of your family have had with the use of anesthetics.  Any blood disorders you have had.  Other health problems you have. RISKS AND COMPLICATIONS Generally, the procedure to implant an ICD is safe. However, as with any surgical procedure, complications can occur. Possible complications associated with implanting an ICD include:  Swelling, bleeding, or bruising at the site where the ICD was implanted.  Infection at the site where the ICD was implanted.  A reaction to medicine used during the procedure.  Nerve, heart, or blood vessel damage.  Blood clots. BEFORE THE PROCEDURE  You may need to have blood tests, heart tests, or a chest X-ray done before the day of the procedure.  Ask your caregiver about changing or stopping your regular medicines.  Make plans to have someone drive you home. You may need to stay in the hospital overnight after the  procedure.  Stop smoking at least 24 hours before the procedure.  Take a bath or shower the night before the procedure. You may need to scrub your chest or abdomen with a special type of soap.  Do not eat or drink before your procedure for as long as directed by your caregiver. Ask if it is okay to take any needed medicine with a small sip of water. PROCEDURE  The procedure to implant an ICD in your chest or abdomen is usually done at a hospital in a room that has a large X-ray machine called a fluoroscope. The machine will be above you during the procedure. It will help your caregiver see your heart during the procedure. Implanting an ICD usually takes 1-3 hours. Before the procedure:   Small monitors will be  put on your body. They will be used to check your heart, blood pressure, and oxygen level.  A needle will be put into a vein in your hand or arm. This is called an intravenous (IV) access tube. Fluids and medicine will flow directly into your body through the IV tube.  Your chest or abdomen will be cleaned with a germ-killing (antiseptic) solution. The area may be shaved.  You may be given medicine to help you relax (sedative).  You will be given a medicine called a local anesthetic. This medicine will make the surgical site numb while the ICD is implanted. You will be sleepy but awake during the procedure. After you are numb the procedure will begin. The caregiver will:  Make a small cut (incision). This will make a pocket deep under your skin that will hold the pulse generator.  Guide the leads through a large blood vessel into your heart and attach them to the heart muscles. Depending on the ICD, the leads may go into one ventricle or they may go to both ventricles and into an upper chamber of the heart (atrium).  Test the ICD.  Close the incision with stitches, glue, or staples. AFTER THE PROCEDURE  You may feel pain. Some pain is normal. It may last a few days.  You may  stay in a recovery area until the local anesthetic has worn off. Your blood pressure and pulse will be checked often. You will be taken to a room where your heart will be monitored.  A chest X-ray will be taken. This is done to check that the cardioverter defibrillator is in the right place.  You may stay in the hospital overnight.  A slight bump may be seen over the skin where the ICD was placed. Sometimes, it is possible to feel the ICD under the skin. This is normal.  In the months and years afterward, your caregiver will check the device, the leads, and the battery every few months. Eventually, when the battery is low, the ICD will be replaced.   This information is not intended to replace advice given to you by your health care provider. Make sure you discuss any questions you have with your health care provider.   Document Released: 01/11/2002 Document Revised: 02/09/2013 Document Reviewed: 05/10/2012 Elsevier Interactive Patient Education 2016 [employer] Defibrillator Implantation, Care After This sheet gives you information about how to care for yourself after your procedure. Your health care provider may also give you more specific instructions. If you have problems or questions, contact your health care provider. What can I expect after the procedure? After the procedure, it is common to have:  Some pain. It may last a few days.  A slight bump over the skin where the device was placed. Sometimes, it is possible to feel the device under the skin. This is normal.  During the months and years after your procedure, your health care provider will check the device, the leads, and the battery every few months. Eventually, when the battery is low, the device will be replaced. Follow these instructions at home: Medicines  Take over-the-counter and prescription medicines only as told by your health care provider.  If you were prescribed an antibiotic medicine, take  it as told by your health care provider. Do not stop taking the antibiotic even if you start to feel better. Incision care   Follow instructions from your health care provider about how to take care of your incision  area. Make sure you: ? Wash your hands with soap and water before you change your bandage (dressing). If soap and water are not available, use hand sanitizer. ? Change your dressing as told by your health care provider. ? Leave stitches (sutures), skin glue, or adhesive strips in place. These skin closures may need to stay in place for 2 weeks or longer. If adhesive strip edges start to loosen and curl up, you may trim the loose edges. Do not remove adhesive strips completely unless your health care provider tells you to do that.  Check your incision area every day for signs of infection. Check for: ? More redness, swelling, or pain. ? More fluid or blood. ? Warmth. ? Pus or a bad smell.  Do not use lotions or ointments near the incision area unless told by your health care provider.  Keep the incision area clean and dry for 2-3 days after the procedure or for as long as told by your health care provider. It takes several weeks for the incision site to heal completely.  Do not take baths, swim, or use a hot tub until your health care provider approves. Activity  Try to walk a little every day. Exercising is important after this procedure. Also, use your shoulder on the side of the defibrillator in daily tasks that do not require a lot of motion.  For at least 6 weeks: ? Do not lift your upper arm above your shoulders. This means no tennis, golf, or swimming for this period of time. If you tend to sleep with your arm above your head, use a restraint to prevent this during sleep. ? Avoid sudden jerking, pulling, or chopping movements that pull your upper arm far away from your body.  Ask your health care provider when you may go back to work.  Check with your health care  provider before you start to drive or play sports. Electric and magnetic fields  Tell all health care providers that you have a defibrillator. This may prevent them from giving you an MRI scan because strong magnets are used for that test.  If you must pass through a metal detector, quickly walk through it. Do not stop under the detector, and do not stand near it.  Avoid places or objects that have a strong electric or magnetic field, including: ? Airport Herbalist. At the airport, let officials know that you have a defibrillator. Your defibrillator ID card will let you be checked in a way that is safe for you and will not damage your defibrillator. Also, do not let a security person wave a magnetic wand near your defibrillator. That can make it stop working. ? Power plants. ? Large electrical generators. ? Anti-theft systems or electronic article surveillance (EAS). ? Radiofrequency transmission towers, such as cell phone and radio towers.  Do not use amateur (ham) radio equipment or electric (arc) welding torches. Some devices are safe to use if held at least 12 inches (30 cm) from your defibrillator. These include power tools, lawn mowers, and speakers. If you are unsure if something is safe to use, ask your health care provider.  Do not use MP3 player headphones. They have magnets.  You may safely use electric blankets, heating pads, computers, and microwave ovens.  When using your cell phone, hold it to the ear that is on the opposite side from the defibrillator. Do not leave your cell phone in a pocket over the defibrillator. General instructions  Follow diet instructions from  your health care provider, if this applies.  Always keep your defibrillator ID card with you. The card should list the implant date, device model, and manufacturer. Consider wearing a medical alert bracelet or necklace.  Have your defibrillator checked every 3-6 months or as often as told by your health  care provider. Most defibrillators last for 4-8 years.  Keep all follow-up visits as told by your health care provider. This is important for your health care provider to make sure your chest is healing the way it should. Ask your health care provider when you should come back to have your stitches or staples taken out. Contact a health care provider if:  You feel one shock in your chest.  You gain weight suddenly.  Your legs or feet swell more than they have before.  It feels like your heart is fluttering or skipping beats (heart palpitations).  You have more redness, swelling, or pain around your incision.  You have more fluid or blood coming from your incision.  Your incision feels warm to the touch.  You have pus or a bad smell coming from your incision.  You have a fever. Get help right away if:  You have chest pain.  You feel more than one shock.  You feel more short of breath than you have felt before.  You feel more light-headed than you have felt before.  Your incision starts to open up. This information is not intended to replace advice given to you by your health care provider. Make sure you discuss any questions you have with your health care provider. Document Released: 11/08/2004 Document Revised: 11/09/2015 Document Reviewed: 09/26/2015 Elsevier Interactive Patient Education  2018 [place] Discharge Instructions for  Pacemaker/Defibrillator Patients  ACTIVITY No heavy lifting or vigorous activity with your left/right arm for 6 to 8 weeks.  Do not raise your left/right arm above your head for one week.  Gradually raise your affected arm as drawn below.           __  NO DRIVING for     ; you may begin driving on     .  WOUND CARE - Keep the wound area clean and dry.  Do not get this area wet for one week. No showers for one week; you may shower on     . - The tape/steri-strips on your wound will fall off; do not pull them off.   No bandage is needed on the site.  DO  NOT apply any creams, oils, or ointments to the wound area. - If you notice any drainage or discharge from the wound, any swelling or bruising at the site, or you develop a fever > 101? F after you are discharged home, call the office at once.  SPECIAL INSTRUCTIONS - You are still able to use cellular telephones; use the ear opposite the side where you have your pacemaker/defibrillator.  Avoid carrying your cellular phone near your device. - When traveling through airports, show security personnel your identification card to avoid being screened in the metal detectors.  Ask the security personnel to use the hand wand. - Avoid arc welding equipment, MRI testing (magnetic resonance imaging), TENS units (transcutaneous nerve stimulators).  Call the office for questions about other devices. - Avoid electrical appliances that are in poor condition or are not properly grounded. - Microwave ovens are safe to be near or to operate.  ADDITIONAL INFORMATION FOR DEFIBRILLATOR PATIENTS SHOULD YOUR DEVICE GO OFF: -  If your device goes off ONCE and you feel fine afterward, notify the device clinic nurses. - If your device goes off ONCE and you do not feel well afterward, call 911. - If your device goes off TWICE, call 911. - If your device goes off [place], call 911.  DO NOT DRIVE YOURSELF OR A FAMILY MEMBER WITH A DEFIBRILLATOR TO THE HOSPITAL-CALL 911.

## 2018-01-18 IMAGING — DX DG CHEST 2V
2 series · 2 of 2 positions shown · non-contrast
Comparison: None.

CLINICAL DATA: Pt has had SOB and left sided chest pain for the
last week. Pt also c/o dry cough. Pt has a hx of HTN, diabetes and
is a recent former smoker.

EXAM:
CHEST  2 VIEW

[chest pa]
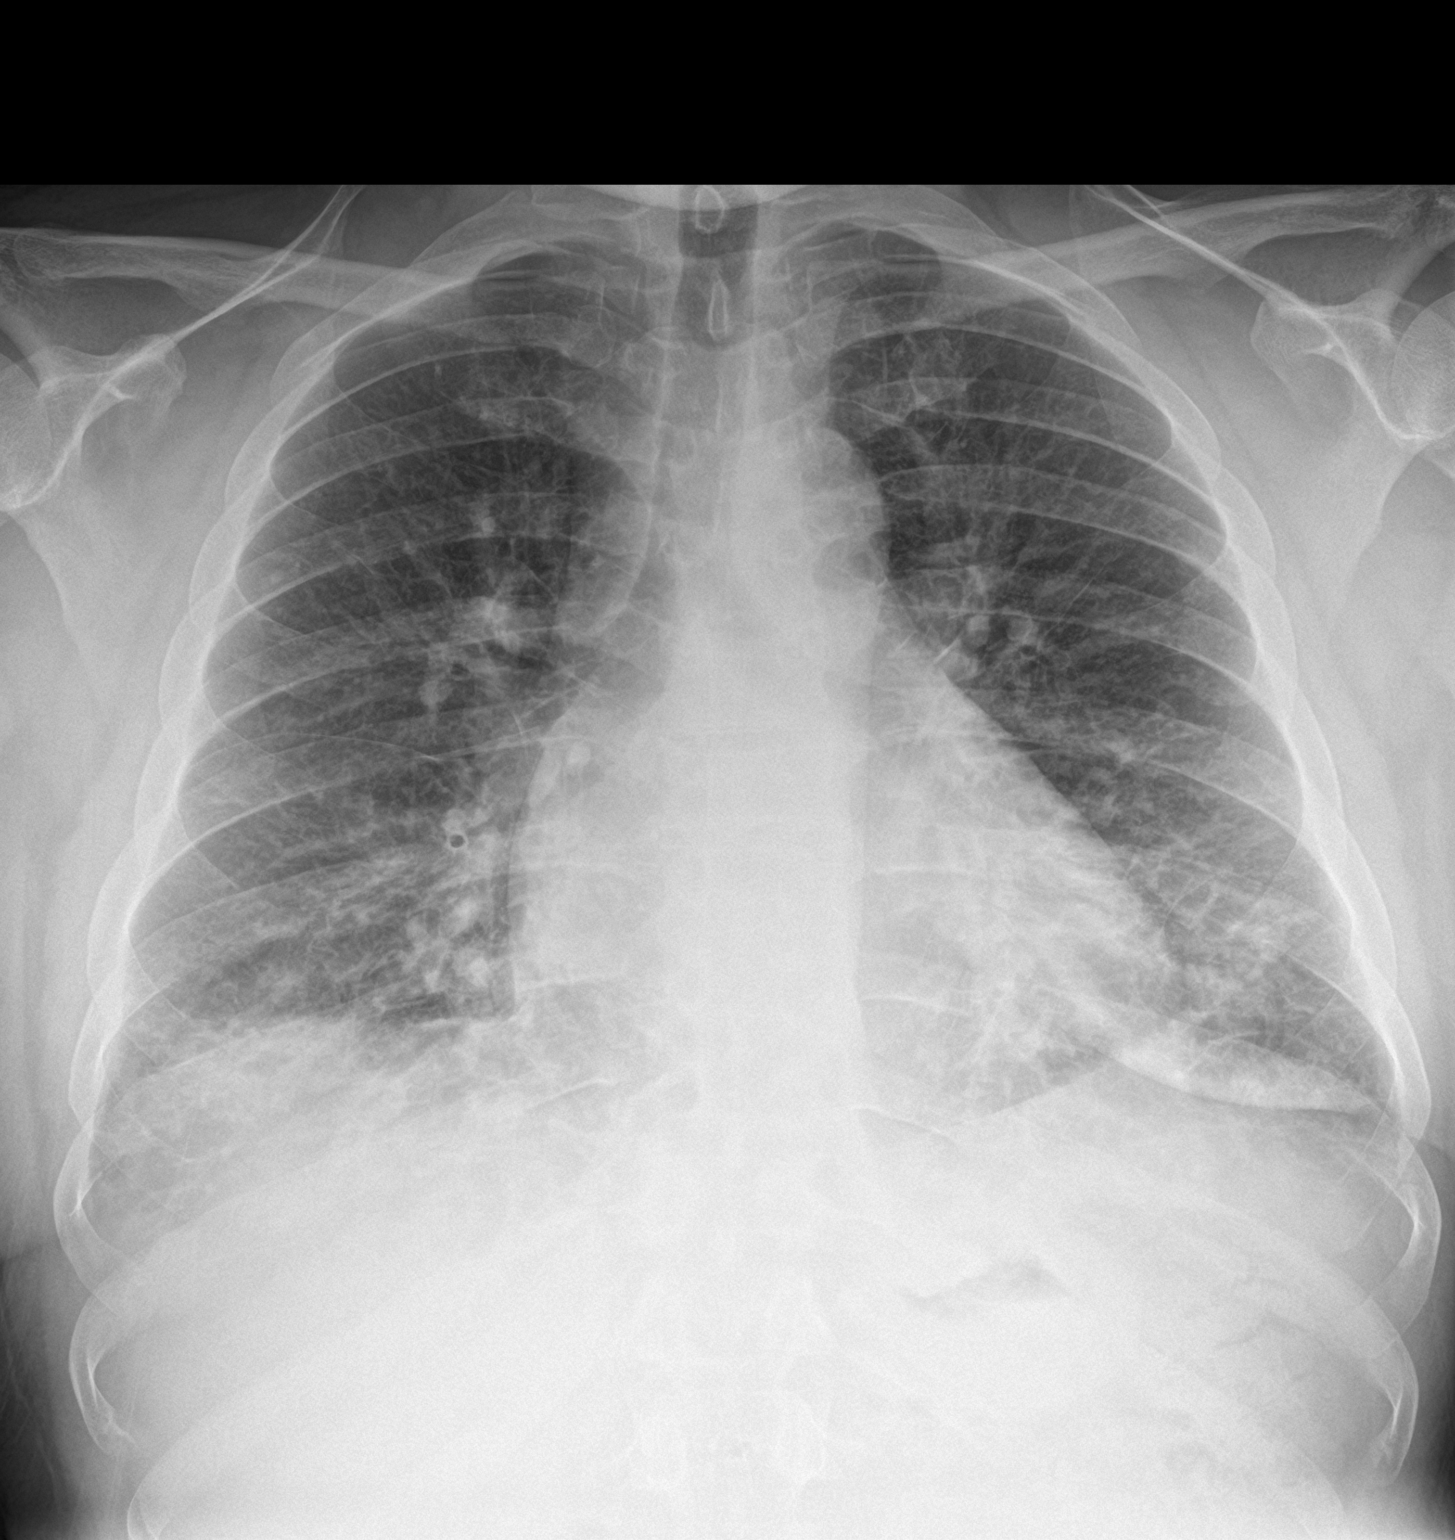

[chest lat]
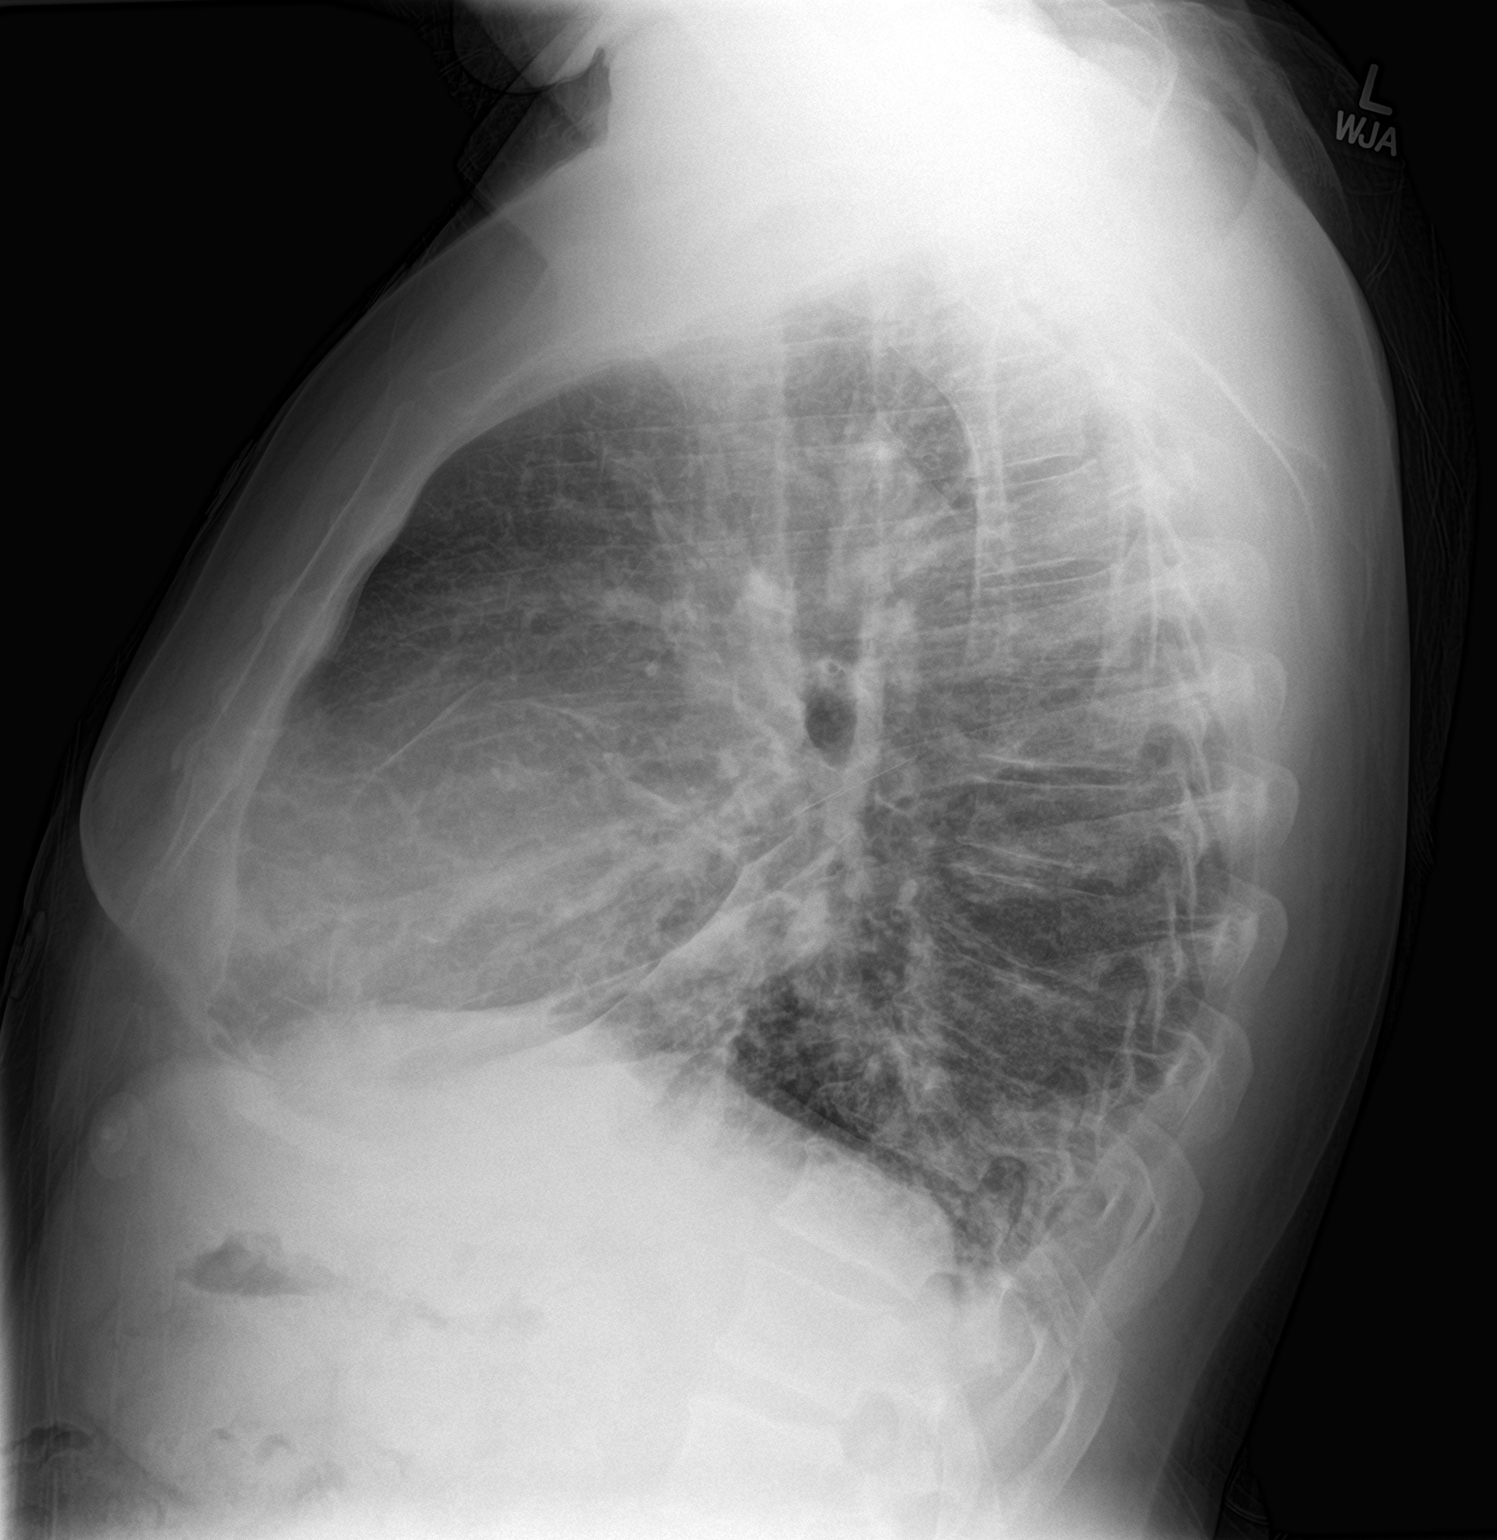

[2 of 2 positions shown; findings below may reference images not displayed]

FINDINGS: Heart size is mildly enlarged. There are patchy infiltrates
involving the lower lobes, lingula, and right middle lobe. Small
bilateral pleural effusions are suspected. No evidence for pulmonary
edema.
IMPRESSION: Multifocal infiltrates.

## 2018-01-20 ENCOUNTER — Institutional Professional Consult (permissible substitution): Payer: 59 | Admitting: Internal Medicine

## 2018-01-20 IMAGING — DX DG CHEST 2V
2 series · 2 of 2 positions shown · non-contrast
Comparison: CT scan of the chest April 20, 2016 and chest
x-ray of the same day.

CLINICAL DATA: Follow-up recent episode of pneumonia. The patient
has completed antibiotics. Patient reports only mild lingering
cough.

EXAM:
CHEST  2 VIEW

[w chest pa]
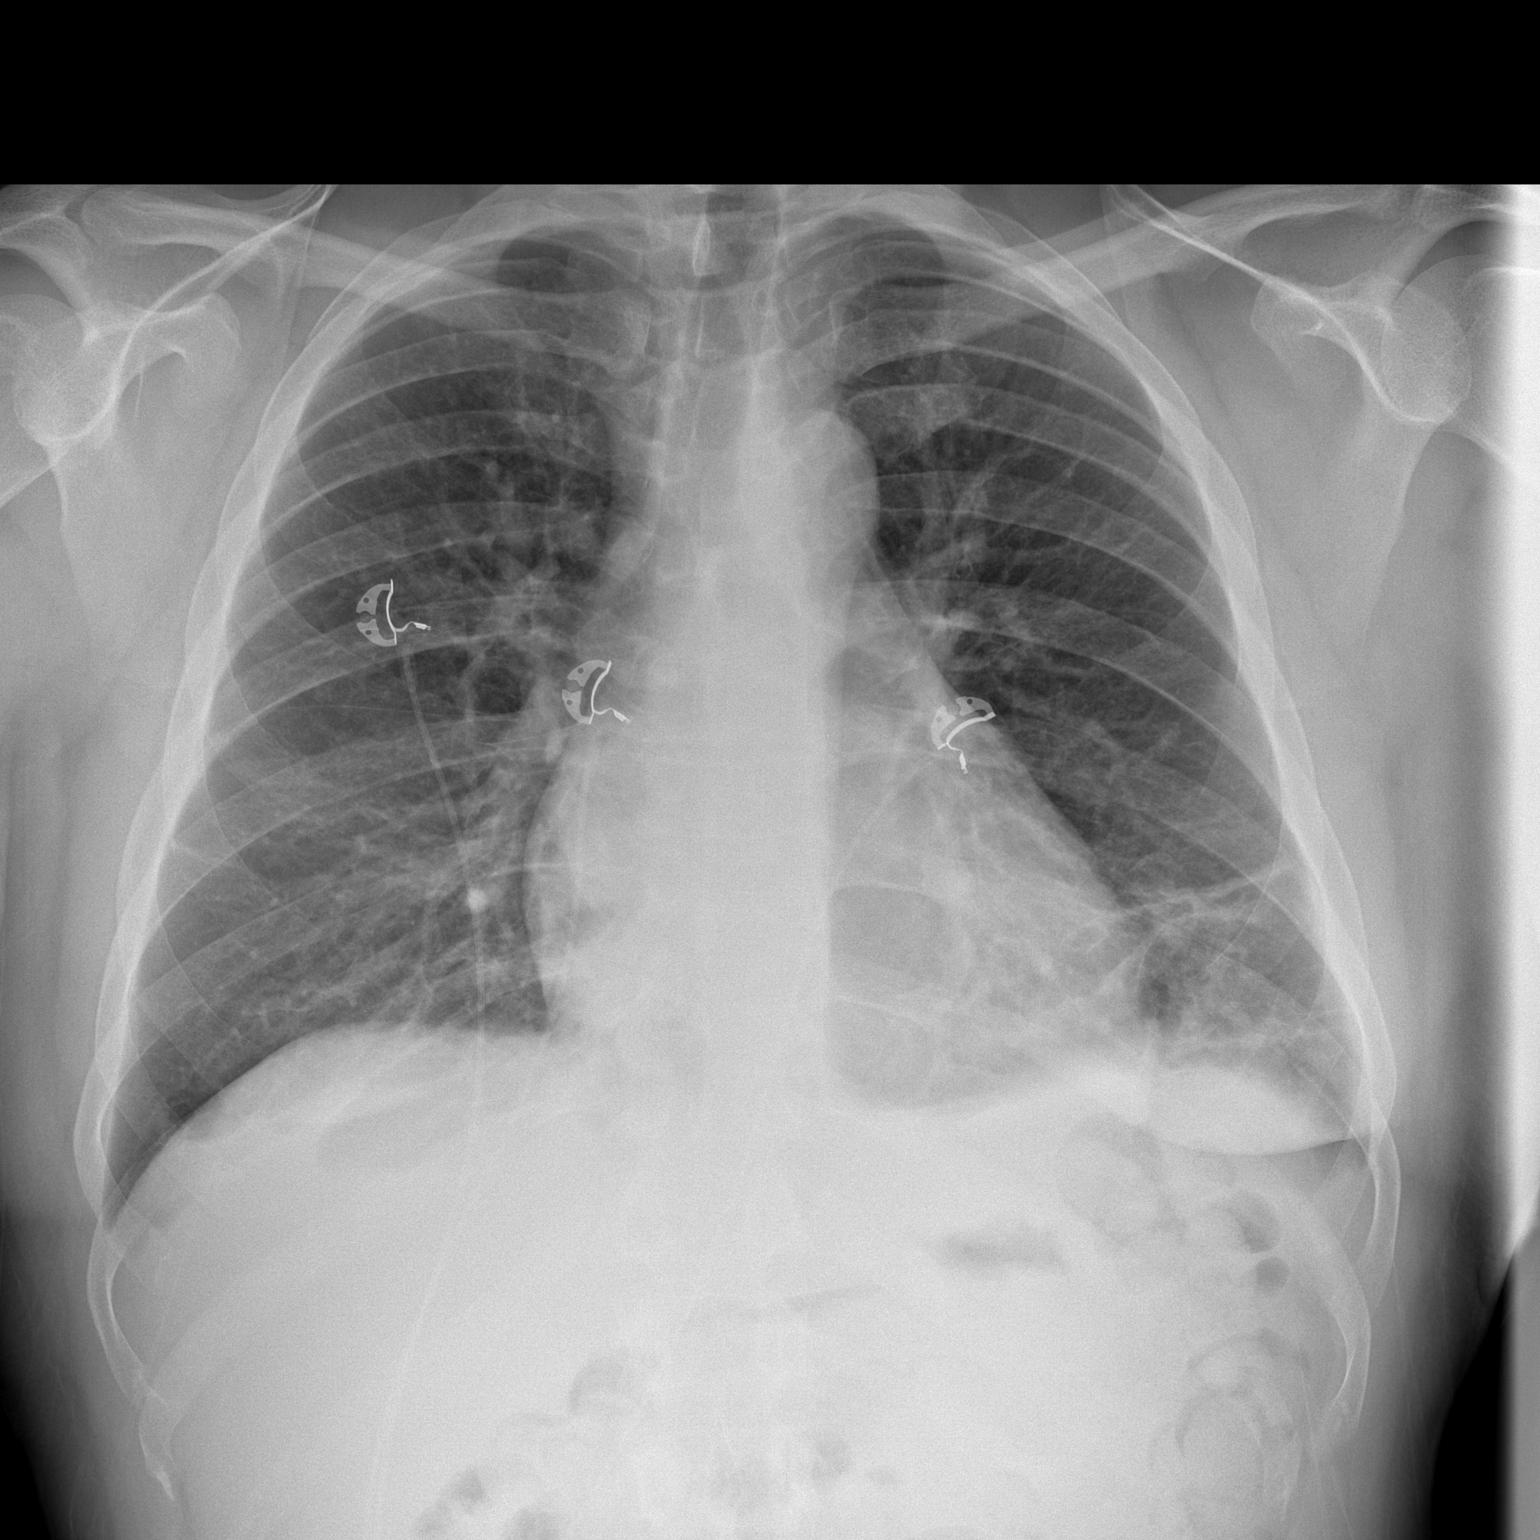

[w chest lat]
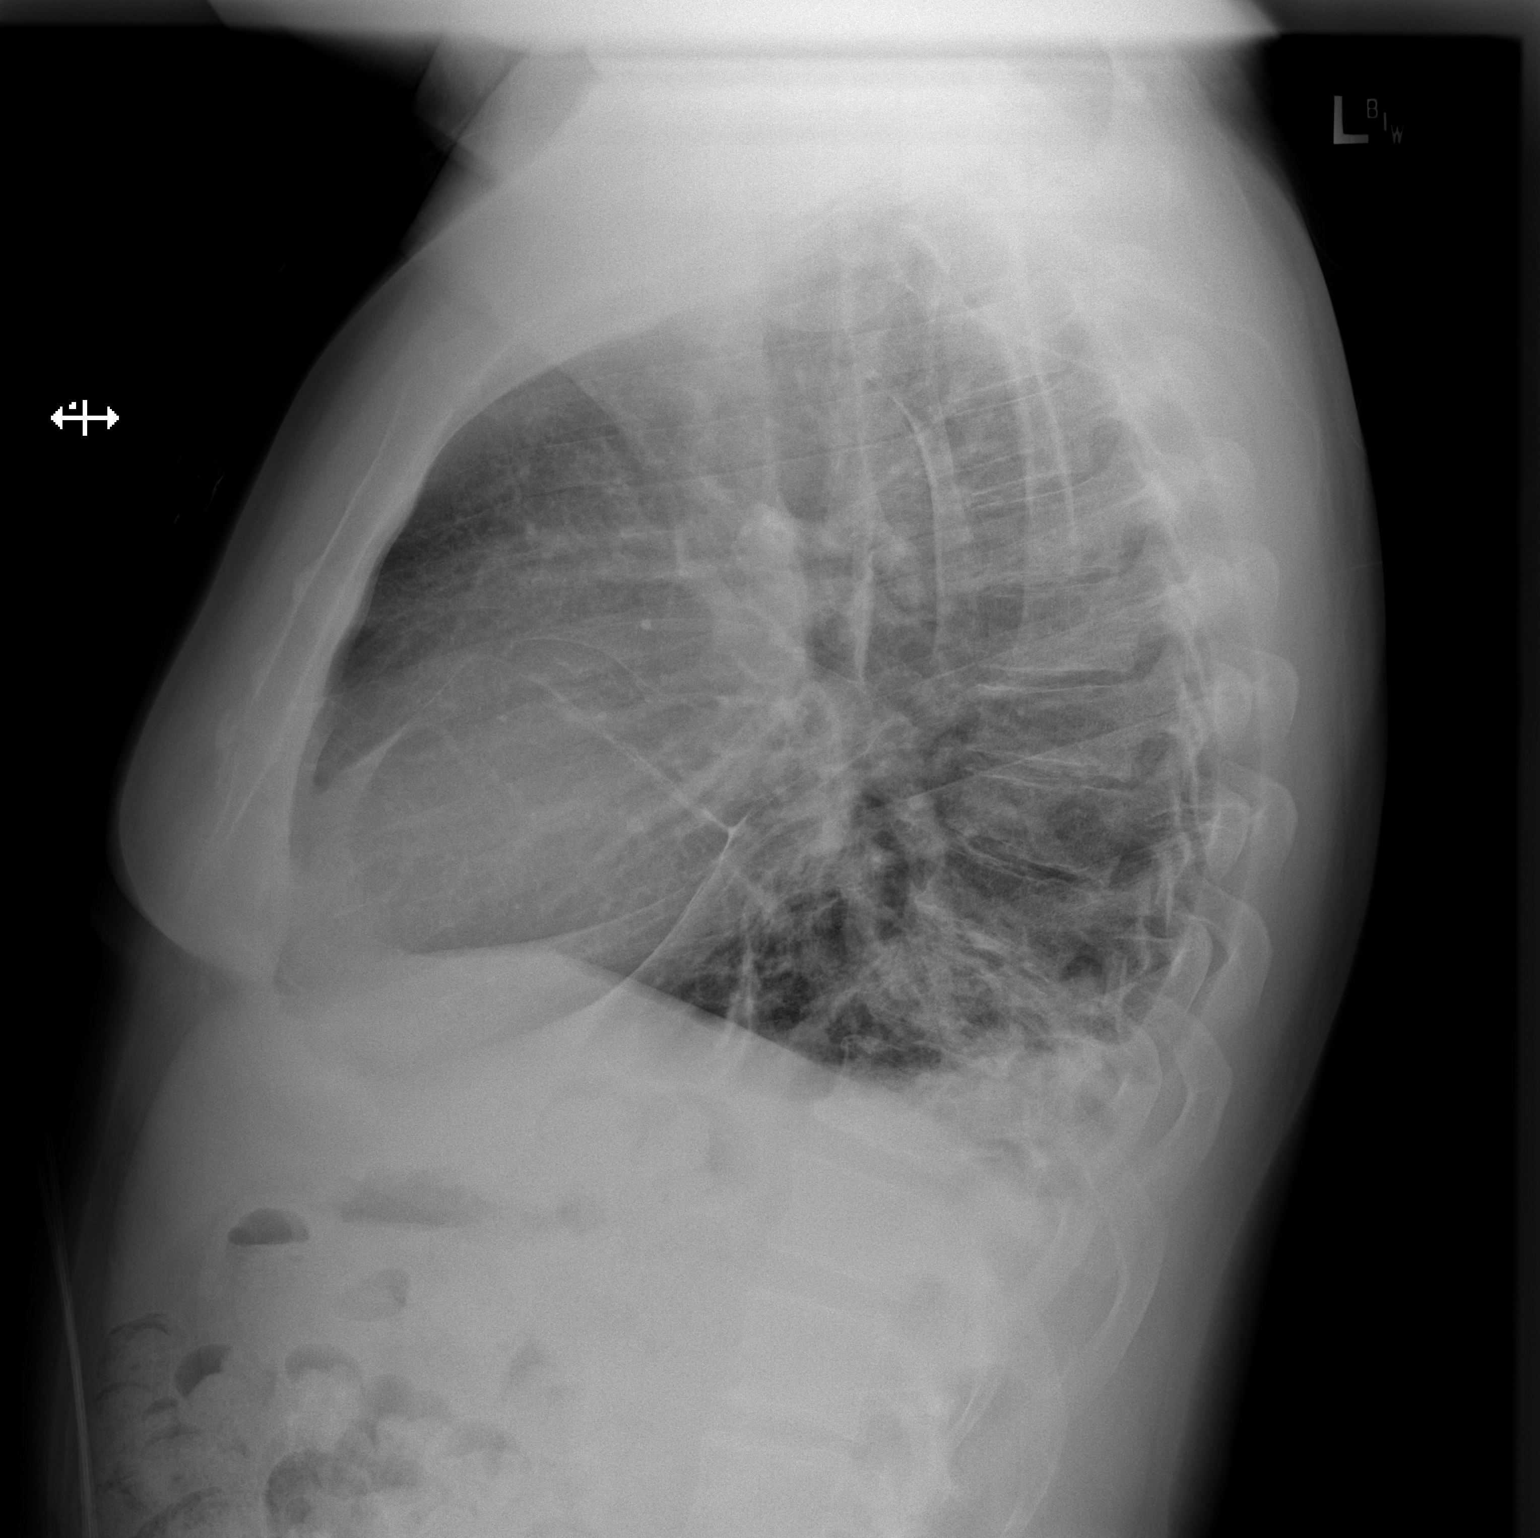

[2 of 2 positions shown; findings below may reference images not displayed]

FINDINGS: The lungs are adequately inflated. There is patchy increased density
in the left lower lobe. There is a small left pleural effusion and
smaller right pleural effusion. There has been improvement in the
appearance of the pulmonary interstitium at the right lung base. The
heart is normal in size. The pulmonary vascularity is not engorged.
The mediastinum is normal in width. The bony thorax is unremarkable.
IMPRESSION: Persistent left lower lobe infiltrate. Persistent small bilateral
pleural effusions greatest on the left. Given that the patient has
clinically improved, a follow-up PA and lateral chest X-ray is
recommended in 3-4 weeks to ensure complete resolution and exclude
underlying malignancy.

## 2018-01-25 ENCOUNTER — Telehealth: Payer: Self-pay | Admitting: Internal Medicine

## 2018-01-25 NOTE — Telephone Encounter (Signed)
New message   Patient is calling to confirm his mc-cath lab appt time on 01/27/2018. Please advise.

## 2018-01-25 NOTE — Telephone Encounter (Signed)
Spoke to patient and informed him that he needs to be at the hospital 9/25 @ 6:30 am for his procedure.  He was thankful for the call and verbalized understanding.

## 2018-01-27 ENCOUNTER — Ambulatory Visit (HOSPITAL_COMMUNITY)
Admission: RE | Disposition: A | Discharge status: Home / Self Care | Payer: Self-pay | Source: Ambulatory Visit | Attending: Internal Medicine

## 2018-01-27 ENCOUNTER — Ambulatory Visit (HOSPITAL_COMMUNITY)
Admission: RE | Admit: 2018-01-27 | Discharge: 2018-01-28 | Disposition: A | Discharge status: Home / Self Care | Payer: 59 | Source: Ambulatory Visit | Attending: Internal Medicine | Admitting: Internal Medicine

## 2018-01-27 DIAGNOSIS — Z888 Allergy status to other drugs, medicaments and biological substances status: Secondary | ICD-10-CM | POA: Diagnosis not present

## 2018-01-27 DIAGNOSIS — Z7982 Long term (current) use of aspirin: Secondary | ICD-10-CM | POA: Diagnosis not present

## 2018-01-27 DIAGNOSIS — Z87891 Personal history of nicotine dependence: Secondary | ICD-10-CM | POA: Insufficient documentation

## 2018-01-27 DIAGNOSIS — I110 Hypertensive heart disease with heart failure: Secondary | ICD-10-CM | POA: Diagnosis not present

## 2018-01-27 DIAGNOSIS — E119 Type 2 diabetes mellitus without complications: Secondary | ICD-10-CM | POA: Insufficient documentation

## 2018-01-27 DIAGNOSIS — I255 Ischemic cardiomyopathy: Secondary | ICD-10-CM | POA: Diagnosis not present

## 2018-01-27 DIAGNOSIS — Z006 Encounter for examination for normal comparison and control in clinical research program: Secondary | ICD-10-CM | POA: Insufficient documentation

## 2018-01-27 DIAGNOSIS — Z955 Presence of coronary angioplasty implant and graft: Secondary | ICD-10-CM | POA: Diagnosis not present

## 2018-01-27 DIAGNOSIS — I5020 Unspecified systolic (congestive) heart failure: Secondary | ICD-10-CM | POA: Diagnosis present

## 2018-01-27 DIAGNOSIS — I5042 Chronic combined systolic (congestive) and diastolic (congestive) heart failure: Secondary | ICD-10-CM | POA: Diagnosis not present

## 2018-01-27 DIAGNOSIS — Z7984 Long term (current) use of oral hypoglycemic drugs: Secondary | ICD-10-CM | POA: Insufficient documentation

## 2018-01-27 DIAGNOSIS — Z9889 Other specified postprocedural states: Secondary | ICD-10-CM | POA: Insufficient documentation

## 2018-01-27 DIAGNOSIS — Z959 Presence of cardiac and vascular implant and graft, unspecified: Secondary | ICD-10-CM

## 2018-01-27 DIAGNOSIS — Z833 Family history of diabetes mellitus: Secondary | ICD-10-CM | POA: Diagnosis not present

## 2018-01-27 DIAGNOSIS — Z79899 Other long term (current) drug therapy: Secondary | ICD-10-CM | POA: Diagnosis not present

## 2018-01-27 DIAGNOSIS — Z95810 Presence of automatic (implantable) cardiac defibrillator: Secondary | ICD-10-CM

## 2018-01-27 HISTORY — DX: Presence of automatic (implantable) cardiac defibrillator: Z95.810

## 2018-01-27 HISTORY — PX: ICD IMPLANT: EP1208

## 2018-01-27 LAB — BASIC METABOLIC PANEL
ANION GAP: 8 (ref 5–15)
BUN: 17 mg/dL (ref 6–20)
CALCIUM: 9.5 mg/dL (ref 8.9–10.3)
CHLORIDE: 107 mmol/L (ref 98–111)
CO2: 25 mmol/L (ref 22–32)
Creatinine, Ser: 1.13 mg/dL (ref 0.61–1.24)
GFR calc Af Amer: >60 mL/min (ref >60)
GLUCOSE: 178 mg/dL — AB (ref 70–99)
POTASSIUM: 4.4 mmol/L (ref 3.5–5.1)
Sodium: 140 mmol/L (ref 135–145)

## 2018-01-27 LAB — SURGICAL PCR SCREEN
MRSA, PCR: NEGATIVE
Staphylococcus aureus: NEGATIVE

## 2018-01-27 LAB — GLUCOSE, CAPILLARY
GLUCOSE-CAPILLARY: 177 mg/dL — AB (ref 70–99)
Glucose-Capillary: 185 mg/dL — ABNORMAL HIGH (ref 70–99)
Glucose-Capillary: 183 mg/dL — ABNORMAL HIGH (ref 70–99)

## 2018-01-27 SURGERY — ICD IMPLANT

## 2018-01-27 MED ORDER — FUROSEMIDE 20 MG PO TABS
20.0000 mg | ORAL_TABLET | ORAL | Status: DC | PRN
  Filled 2018-01-27: qty 1

## 2018-01-27 MED ORDER — MIDAZOLAM HCL 5 MG/5ML IJ SOLN
INTRAMUSCULAR | Status: DC | PRN
  Administered 2018-01-27: 1 mg via INTRAVENOUS
  Administered 2018-01-27: 2 mg via INTRAVENOUS
  Administered 2018-01-27 (×2): 1 mg via INTRAVENOUS

## 2018-01-27 MED ORDER — ACETAMINOPHEN 325 MG PO TABS
ORAL_TABLET | ORAL | Status: AC
  Filled 2018-01-27: qty 2

## 2018-01-27 MED ORDER — LIDOCAINE HCL (PF) 1 % IJ SOLN
INTRAMUSCULAR | Status: DC | PRN
  Administered 2018-01-27: 60 mL

## 2018-01-27 MED ORDER — METFORMIN HCL ER 500 MG PO TB24
500.0000 mg | ORAL_TABLET | Freq: Two times a day (BID) | ORAL | Status: DC
Start: 2018-01-27 — End: 2018-01-28
  Administered 2018-01-27 – 2018-01-28 (×2): 500 mg via ORAL
  Filled 2018-01-27 (×2): qty 1

## 2018-01-27 MED ORDER — ASPIRIN EC 81 MG PO TBEC: 81.0000 mg | DELAYED_RELEASE_TABLET | Freq: Every day | ORAL | Status: DC

## 2018-01-27 MED ORDER — SACUBITRIL-VALSARTAN 97-103 MG PO TABS
1.0000 | ORAL_TABLET | Freq: Two times a day (BID) | ORAL | Status: DC
  Administered 2018-01-27 – 2018-01-28 (×3): 1 via ORAL
  Filled 2018-01-27 (×3): qty 1

## 2018-01-27 MED ORDER — ASPIRIN EC 81 MG PO TBEC
81.0000 mg | DELAYED_RELEASE_TABLET | Freq: Every day | ORAL | Status: DC
  Administered 2018-01-28: 81 mg via ORAL
  Filled 2018-01-27 (×2): qty 1

## 2018-01-27 MED ORDER — HEPARIN (PORCINE) IN NACL 2000-0.9 UNIT/L-% IV SOLN
INTRAVENOUS | Status: DC | PRN
  Administered 2018-01-27: 1000 mL

## 2018-01-27 MED ORDER — LIDOCAINE HCL (PF) 1 % IJ SOLN
INTRAMUSCULAR | Status: AC
  Filled 2018-01-27: qty 60

## 2018-01-27 MED ORDER — HYDRALAZINE HCL 50 MG PO TABS
75.0000 mg | ORAL_TABLET | Freq: Three times a day (TID) | ORAL | Status: DC
  Administered 2018-01-27 – 2018-01-28 (×3): 75 mg via ORAL
  Filled 2018-01-27 (×3): qty 1

## 2018-01-27 MED ORDER — SPIRONOLACTONE 25 MG PO TABS
25.0000 mg | ORAL_TABLET | Freq: Every day | ORAL | Status: DC
  Administered 2018-01-27: 25 mg via ORAL
  Filled 2018-01-27 (×2): qty 1

## 2018-01-27 MED ORDER — SODIUM CHLORIDE 0.9 % IV SOLN
INTRAVENOUS | Status: DC
  Administered 2018-01-27: 08:00:00 via INTRAVENOUS

## 2018-01-27 MED ORDER — MUPIROCIN 2 % EX OINT
TOPICAL_OINTMENT | Freq: Once | CUTANEOUS | Status: AC
  Administered 2018-01-27: 1 via NASAL
  Filled 2018-01-27 (×2): qty 22

## 2018-01-27 MED ORDER — SODIUM CHLORIDE 0.9 % IV SOLN
INTRAVENOUS | Status: DC | PRN
  Administered 2018-01-27: 250 mL via INTRAVENOUS

## 2018-01-27 MED ORDER — TRAMADOL HCL 50 MG PO TABS
50.0000 mg | ORAL_TABLET | Freq: Four times a day (QID) | ORAL | Status: DC | PRN
  Administered 2018-01-27 – 2018-01-28 (×2): 50 mg via ORAL
  Filled 2018-01-27 (×2): qty 1

## 2018-01-27 MED ORDER — SODIUM CHLORIDE 0.9 % IV SOLN
INTRAVENOUS | Status: AC
  Administered 2018-01-27: 13:00:00 via INTRAVENOUS

## 2018-01-27 MED ORDER — ISOSORBIDE MONONITRATE ER 30 MG PO TB24
30.0000 mg | ORAL_TABLET | Freq: Every day | ORAL | Status: DC
  Administered 2018-01-27 – 2018-01-28 (×2): 30 mg via ORAL
  Filled 2018-01-27 (×2): qty 1

## 2018-01-27 MED ORDER — HEPARIN (PORCINE) IN NACL 2000-0.9 UNIT/L-% IV SOLN
INTRAVENOUS | Status: AC
  Filled 2018-01-27: qty 1000

## 2018-01-27 MED ORDER — CEFAZOLIN SODIUM-DEXTROSE 2-4 GM/100ML-% IV SOLN
2.0000 g | INTRAVENOUS | Status: AC
  Administered 2018-01-27: 2 g via INTRAVENOUS
  Filled 2018-01-27: qty 100

## 2018-01-27 MED ORDER — CARVEDILOL 6.25 MG PO TABS
6.2500 mg | ORAL_TABLET | Freq: Two times a day (BID) | ORAL | Status: DC
  Administered 2018-01-27 – 2018-01-28 (×2): 6.25 mg via ORAL
  Filled 2018-01-27 (×2): qty 1

## 2018-01-27 MED ORDER — FENTANYL CITRATE (PF) 100 MCG/2ML IJ SOLN
INTRAMUSCULAR | Status: DC | PRN
  Administered 2018-01-27: 50 ug via INTRAVENOUS
  Administered 2018-01-27 (×2): 12.5 ug via INTRAVENOUS
  Administered 2018-01-27: 25 ug via INTRAVENOUS

## 2018-01-27 MED ORDER — ATORVASTATIN CALCIUM 80 MG PO TABS
80.0000 mg | ORAL_TABLET | Freq: Every day | ORAL | Status: DC
  Filled 2018-01-27: qty 1

## 2018-01-27 MED ORDER — MIDAZOLAM HCL 5 MG/5ML IJ SOLN
INTRAMUSCULAR | Status: AC
  Filled 2018-01-27: qty 5

## 2018-01-27 MED ORDER — SODIUM CHLORIDE 0.9 % IV SOLN
INTRAVENOUS | Status: AC
  Filled 2018-01-27: qty 2

## 2018-01-27 MED ORDER — CEFAZOLIN SODIUM-DEXTROSE 2-4 GM/100ML-% IV SOLN
INTRAVENOUS | Status: AC
  Filled 2018-01-27: qty 100

## 2018-01-27 MED ORDER — POTASSIUM CHLORIDE CRYS ER 20 MEQ PO TBCR
20.0000 meq | EXTENDED_RELEASE_TABLET | Freq: Two times a day (BID) | ORAL | Status: DC
  Administered 2018-01-27 – 2018-01-28 (×3): 20 meq via ORAL
  Filled 2018-01-27 (×3): qty 1

## 2018-01-27 MED ORDER — ALBUTEROL SULFATE (2.5 MG/3ML) 0.083% IN NEBU
3.0000 mL | INHALATION_SOLUTION | Freq: Four times a day (QID) | RESPIRATORY_TRACT | Status: DC | PRN
  Filled 2018-01-27: qty 3

## 2018-01-27 MED ORDER — ONDANSETRON HCL 4 MG/2ML IJ SOLN: 4.0000 mg | Freq: Four times a day (QID) | INTRAMUSCULAR | Status: DC | PRN

## 2018-01-27 MED ORDER — CHLORHEXIDINE GLUCONATE 4 % EX LIQD
60.0000 mL | Freq: Once | CUTANEOUS | Status: DC
  Filled 2018-01-27: qty 60

## 2018-01-27 MED ORDER — CEFAZOLIN SODIUM-DEXTROSE 1-4 GM/50ML-% IV SOLN
1.0000 g | Freq: Four times a day (QID) | INTRAVENOUS | Status: AC
  Administered 2018-01-27 – 2018-01-28 (×3): 1 g via INTRAVENOUS
  Filled 2018-01-27 (×3): qty 50

## 2018-01-27 MED ORDER — SODIUM CHLORIDE 0.9 % IV SOLN
80.0000 mg | INTRAVENOUS | Status: AC
  Administered 2018-01-27: 80 mg
  Filled 2018-01-27: qty 2

## 2018-01-27 MED ORDER — GLIPIZIDE ER 10 MG PO TB24
20.0000 mg | ORAL_TABLET | Freq: Two times a day (BID) | ORAL | Status: DC
  Administered 2018-01-27 – 2018-01-28 (×2): 20 mg via ORAL
  Filled 2018-01-27 (×3): qty 2

## 2018-01-27 MED ORDER — ACETAMINOPHEN 325 MG PO TABS
325.0000 mg | ORAL_TABLET | ORAL | Status: DC | PRN
  Administered 2018-01-27: 650 mg via ORAL
  Filled 2018-01-27: qty 2

## 2018-01-27 MED ORDER — FENTANYL CITRATE (PF) 100 MCG/2ML IJ SOLN
INTRAMUSCULAR | Status: AC
  Filled 2018-01-27: qty 2

## 2018-01-27 SURGICAL SUPPLY — 7 items
CABLE SURGICAL S-101-97-12 (CABLE) ×2 IMPLANT
HEMOSTAT SURGICEL 2X4 FIBR (HEMOSTASIS) ×1 IMPLANT
ICD VIGILANT VR D232 (Pacemaker) ×1 IMPLANT
LEAD RELIANCE G DF4 0293 (Lead) ×1 IMPLANT
PAD PRO RADIOLUCENT 2001M-C (PAD) ×2 IMPLANT
SHEATH CLASSIC 9.5F (SHEATH) ×1 IMPLANT
TRAY PACEMAKER INSERTION (PACKS) ×2 IMPLANT

## 2018-01-27 NOTE — Interval H&P Note (Signed)
History and Physical Interval Note:ICD Criteria  Current LVEF:33%. Within 12 months prior to implant: Yes   Heart failure history: Yes, Class II  Cardiomyopathy history: Yes, Ischemic Cardiomyopathy - Prior MI.  Atrial Fibrillation/Atrial Flutter: No.  Ventricular tachycardia history: No.  Cardiac arrest history: No.  History of syndromes with risk of sudden death: No.  Previous ICD: No.  Current ICD indication: Primary  PPM indication: No.  Class I or II Bradycardia indication present: No  Beta Blocker therapy for 3 or more months: Yes, prescribed.   Ace Inhibitor/ARB therapy for 3 or more months: Yes, prescribed.    I have seen Christopher Roth is a 47 y.o. malepre-procedural and has been referred by DB for consideration of ICD implant for primary  prevention of sudden death.  The patient's chart has been reviewed and they meet criteria for ICD implant.  I have had a thorough discussion with the patient reviewing options.  The patient and their family (if available) have had opportunities to ask questions and have them answered. The patient and I have decided together through the [place] [employer] to implant ICD at this time.  Risks, benefits, alternatives to ICD implantation were discussed in detail with the patient today. The patient  understands that the risks include but are not limited to bleeding, infection, pneumothorax, perforation, tamponade, vascular damage, renal failure, MI, stroke, death, inappropriate shocks, and lead dislodgement and  wishes to proceed.    01/27/2018 8:03 AM  [name]  has presented today for surgery, with the diagnosis of cm  The various methods of treatment have been discussed with the patient and family. After consideration of risks, benefits and other options for treatment, the patient has consented to  Procedure(s): ICD IMPLANT (N/A) as a surgical intervention .  The patient's history has been reviewed, patient  examined, no change in status, stable for surgery.  I have reviewed the patient's chart and labs.  Questions were answered to the patient's satisfaction.     Christopher Roth

## 2018-01-28 ENCOUNTER — Encounter (HOSPITAL_COMMUNITY): Payer: Self-pay | Admitting: Internal Medicine

## 2018-01-28 ENCOUNTER — Ambulatory Visit (HOSPITAL_COMMUNITY): Payer: 59

## 2018-01-28 DIAGNOSIS — I255 Ischemic cardiomyopathy: Secondary | ICD-10-CM

## 2018-01-28 MED ORDER — HYDROCODONE-ACETAMINOPHEN 5-325 MG PO TABS
1.0000 | ORAL_TABLET | Freq: Once | ORAL | Status: AC
  Administered 2018-01-28: 1 via ORAL
  Filled 2018-01-28: qty 1

## 2018-01-28 MED FILL — Lidocaine HCl Local Inj 1%: INTRAMUSCULAR | Qty: 60 | Status: AC

## 2018-01-28 NOTE — Discharge Summary (Addendum)
ELECTROPHYSIOLOGY PROCEDURE DISCHARGE SUMMARY    Patient ID: Christopher Roth,  MRN: [id number], DOB/AGE: 06-02-1970 47 y.o.  Admit date: 01/27/2018 Discharge date: 01/28/2018  Primary Care Physician: Helane Rima, MD  Primary Cardiologist: Dr. Stanford Breed AHF: Dr. [name] Electrophysiologist: Dr. Caryl Comes  Primary Discharge Diagnosis:  1. ICM  Secondary Discharge Diagnosis:  1. CAD 2. Chronic combined CHF 3. HTN 4. DM  Allergies  Allergen Reactions  . Januvia [Sitagliptin] Hives  . Nickel Rash     Procedures This Admission:  1.  Implantation of a BSCi single chamber ICD on 01/27/18 by Dr Caryl Comes.  The patient received a [place], serial number [id number], [employer] single coil active fixation defibrillator lead, model [id number] serial number [id number] DFT's were deferred at time of implant.   There were no immediate post procedure complications. 2.  CXR on 01/28/18 demonstrated no pneumothorax status post device implantation.   Brief HPI: Christopher Roth is a 47 y.o. male was referred to electrophysiology in the outpatient setting for consideration of ICD implantation.  Past medical history includes above.  The patient has persistent LV dysfunction despite guideline directed therapy.  Risks, benefits, and alternatives to ICD implantation were reviewed with the patient who wished to proceed.   Hospital Course:  The patient was admitted and underwent implantation of an ICD with details as outlined above. He was monitored on telemetry overnight which demonstrated SR/SB 50-60's, occ PVCs.  Left chest was without hematoma or ecchymosis.  The device was interrogated and found to be functioning normally.  CXR was obtained and demonstrated no pneumothorax status post device implantation.  Wound care, arm mobility, and restrictions were reviewed with the patient.  The patient feels well this morning, no CP, no SOB, he was examined by Dr. Caryl Comes and considered stable for discharge to  home.   The patient's discharge medications include an ARB (Entresto) and beta blocker (carvedilol).   Physical Exam: Vitals:   01/27/18 1830 01/27/18 1854 01/27/18 2136 01/28/18 0429  BP: (!) 138/91 (!) 150/99 109/66 122/72  Pulse:   62 (!) 56  Resp: 15 10    Temp:   98 F (36.7 C) 98.2 F (36.8 C)  TempSrc:   Oral Oral  SpO2: 97% 98% 98% 96%  Weight:    98.5 kg  Height:        GEN- The patient is well appearing, alert and oriented x 3 today.   HEENT: normocephalic, atraumatic; sclera clear, conjunctiva pink; hearing intact; oropharynx clear Lungs- CTA b/l, normal work of breathing.  No wheezes, rales, rhonchi Heart- RRR,  no murmurs, rubs or gallops, PMI not laterally displaced GI- soft, non-tender, non-distended, bowel sounds present Extremities- no clubbing, cyanosis, or edema MS- no significant deformity or atrophy Skin- warm and dry, no rash or lesion, left chest without hematoma/ecchymosis Psych- euthymic mood, full affect Neuro- no gross defecits  Labs:   Lab Results  Component Value Date   WBC 7.2 01/01/2018   HGB 11.6 (L) 01/01/2018   HCT 36.8 (L) 01/01/2018   MCV 84 01/01/2018   PLT 300 01/01/2018    Recent Labs  Lab 01/27/18 0757  NA 140  K 4.4  CL 107  CO2 25  BUN 17  CREATININE 1.13  CALCIUM 9.5  GLUCOSE 178*    Discharge Medications:  Allergies as of 01/28/2018      Reactions   Januvia [sitagliptin] Hives   Nickel Rash      Medication List    TAKE these  medications   albuterol 108 (90 Base) MCG/ACT inhaler Commonly known as:  PROVENTIL HFA;VENTOLIN HFA Inhale 2 puffs into the lungs every 6 (six) hours as needed for wheezing or shortness of breath.   Alcohol Swabs Pads 1 Package by Does not apply route as needed (with blood glucose checks).   aspirin EC 81 MG tablet Take 81 mg by mouth daily. What changed:  Another medication with the same name was removed. Continue taking this medication, and follow the directions you see here.     atorvastatin 80 MG tablet Commonly known as:  LIPITOR TAKE ONE TABLET BY MOUTH DAILY AT 6 PM. What changed:  See the new instructions.   carvedilol 6.25 MG tablet Commonly known as:  COREG TAKE 1 TABLET BY MOUTH TWICE DAILY WITH A MEAL What changed:  See the new instructions.   freestyle lancets Use as instructed   furosemide 20 MG tablet Commonly known as:  LASIX Take 1 tablet (20 mg total) by mouth as needed for fluid.   glipiZIDE 10 MG 24 hr tablet Commonly known as:  GLUCOTROL XL Take 20 mg by mouth 2 (two) times daily.   glucose blood test strip Use as instructed   glucose monitoring kit monitoring kit 1 each by Does not apply route as needed for other.   hydrALAZINE 50 MG tablet Commonly known as:  APRESOLINE Take 37m ( 1 & 1/2 tablets) three times daily. What changed:    how much to take  how to take this  when to take this   isosorbide mononitrate 30 MG 24 hr tablet Commonly known as:  IMDUR Take 1 tablet (30 mg total) by mouth daily.   metFORMIN 500 MG 24 hr tablet Commonly known as:  GLUCOPHAGE-XR Take 500 mg by mouth 2 (two) times daily.   potassium chloride SA 20 MEQ tablet Commonly known as:  K-DUR,KLOR-CON Take 2 tablets (40 mEq total) by mouth daily. What changed:    how much to take  when to take this   sacubitril-valsartan 97-103 MG Commonly known as:  ENTRESTO Take 1 tablet by mouth 2 (two) times daily.   spironolactone 25 MG tablet Commonly known as:  ALDACTONE Take 1 tablet (25 mg total) by mouth daily.       Disposition:  Home  Discharge Instructions    Diet - low sodium heart healthy   Complete by:  As directed    Increase activity slowly   Complete by:  As directed      Follow-up Information    [facility]2018.   Specialty:  Cardiology Why:  11:30AM, wound check visit Contact information: [id number]       KDeboraha Sprang MD Follow up on 05/11/2018.   Specialty:  Cardiology Why:  3:00PM Contact information: [id number] [phone]          Duration of Discharge Encounter: Greater than 30 minutes including physician time.  SVenetia Night PA-C 01/28/2018 8:52 AM  Instructions reviewed Chest x-ray normal Device function normal Discussed with Dr. MAundra Dubin

## 2018-01-28 NOTE — Progress Notes (Signed)
Patient received discharge information and acknowledged understanding of it. Patient IV was removed.

## 2018-01-28 NOTE — Discharge Instructions (Signed)
° ° °  Supplemental Discharge Instructions for  Pacemaker/Defibrillator Patients  Activity No heavy lifting or vigorous activity with your left/right arm for 6 to 8 weeks.  Do not raise your left/right arm above your head for one week.  Gradually raise your affected arm as drawn below.             01/31/18                     02/01/18                    02/02/18                   02/03/18 __  NO DRIVING for  1 week   ; you may begin driving on 25/4/98  .  WOUND CARE - Keep the wound area clean and dry.  Do not get this area wet for 24 hours. No showers for 24hours you may shower on  01/28/18 evening. - The tape/steri-strips on your wound will fall off; do not pull them off.  No bandage is needed on the site.  DO  NOT apply any creams, oils, or ointments to the wound area. - If you notice any drainage or discharge from the wound, any swelling or bruising at the site, or you develop a fever > 101? F after you are discharged home, call the office at once.  Special Instructions - You are still able to use cellular telephones; use the ear opposite the side where you have your pacemaker/defibrillator.  Avoid carrying your cellular phone near your device. - When traveling through airports, show security personnel your identification card to avoid being screened in the metal detectors.  Ask the security personnel to use the hand wand. - Avoid arc welding equipment, MRI testing (magnetic resonance imaging), TENS units (transcutaneous nerve stimulators).  Call the office for questions about other devices. - Avoid electrical appliances that are in poor condition or are not properly grounded. - Microwave ovens are safe to be near or to operate.  Additional information for defibrillator patients should your device go off: - If your device goes off ONCE and you feel fine afterward, notify the device clinic nurses. - If your device goes off ONCE and you do not feel well afterward, call 911. - If your device  goes off TWICE, call 911. - If your device goes off THREE times in one day, call 911.  DO NOT DRIVE YOURSELF OR A FAMILY MEMBER WITH A DEFIBRILLATOR TO THE HOSPITAL--CALL 911.

## 2018-01-29 ENCOUNTER — Telehealth: Payer: Self-pay | Admitting: Internal Medicine

## 2018-01-29 NOTE — Telephone Encounter (Signed)
Pt calling today to ask about work restrictions. 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 I reviewed weight and movement restrictions with him. I suggested he use the arm opposite to his device to make long phone calls, typing is ok. Pt states he saw a small drop of blood at his incision site today, but says he has not seen anything additional. I advised that if his incision begins to have drainage or bleeding more than he saw today, please call the office and we will arrange for him to have his wound check earlier. Pt has verbalized understanding and had no additional questions.

## 2018-01-29 NOTE — Telephone Encounter (Signed)
New Message;   Patient calling he would like to know when can he go back to work. Please call Pt.

## 2018-02-08 ENCOUNTER — Ambulatory Visit (INDEPENDENT_AMBULATORY_CARE_PROVIDER_SITE_OTHER): Payer: 59 | Admitting: *Deleted

## 2018-02-08 DIAGNOSIS — I255 Ischemic cardiomyopathy: Secondary | ICD-10-CM

## 2018-02-08 LAB — CUP PACEART INCLINIC DEVICE CHECK
HighPow Impedance: 57 Ohm
Implantable Lead Location: 753860
Implantable Lead Model: 293
Lead Channel Setting Pacing Amplitude: 3.5 V
Lead Channel Setting Pacing Pulse Width: 0.4 ms
Lead Channel Setting Sensing Sensitivity: 0.5 mV
MDC IDC LEAD IMPLANT DT: 20190925
MDC IDC LEAD SERIAL: 441633
MDC IDC MSMT LEADCHNL RV IMPEDANCE VALUE: 524 Ohm
MDC IDC MSMT LEADCHNL RV PACING THRESHOLD AMPLITUDE: 1 V
MDC IDC MSMT LEADCHNL RV PACING THRESHOLD PULSEWIDTH: 0.4 ms
MDC IDC MSMT LEADCHNL RV SENSING INTR AMPL: 20.7 mV
MDC IDC PG IMPLANT DT: 20190925
MDC IDC SESS DTM: 20191007040000
Pulse Gen Serial Number: 244469

## 2018-02-08 NOTE — Progress Notes (Signed)
Wound check appointment. Dermabond removed. Wound without redness or edema. Incision edges approximated, wound well healed. Normal device function. Threshold, sensing, and impedances consistent with implant measurements. Device programmed at 3.5V for extra safety margin until 3 month visit. Histogram distribution appropriate for patient and level of activity. No mode switches or ventricular arrhythmias noted. Patient educated about wound care, arm mobility, lifting restrictions, shock plan. ROV 05/11/2018 w/ SK

## 2018-03-19 DIAGNOSIS — Z95810 Presence of automatic (implantable) cardiac defibrillator: Secondary | ICD-10-CM | POA: Insufficient documentation

## 2018-04-13 ENCOUNTER — Other Ambulatory Visit (HOSPITAL_COMMUNITY): Payer: Self-pay | Admitting: Student

## 2018-04-19 ENCOUNTER — Other Ambulatory Visit (HOSPITAL_COMMUNITY): Payer: Self-pay

## 2018-04-19 MED ORDER — SACUBITRIL-VALSARTAN 97-103 MG PO TABS: 1.0000 | ORAL_TABLET | Freq: Two times a day (BID) | ORAL | 1 refills | Status: DC

## 2018-04-21 ENCOUNTER — Other Ambulatory Visit (HOSPITAL_COMMUNITY): Payer: Self-pay | Admitting: *Deleted

## 2018-04-21 ENCOUNTER — Other Ambulatory Visit (HOSPITAL_COMMUNITY): Payer: Self-pay | Admitting: Internal Medicine

## 2018-04-23 ENCOUNTER — Telehealth (HOSPITAL_COMMUNITY): Payer: Self-pay

## 2018-04-23 ENCOUNTER — Telehealth (HOSPITAL_COMMUNITY): Payer: Self-pay | Admitting: Cardiology

## 2018-04-23 NOTE — Telephone Encounter (Signed)
-----   Message from Adora Fridge, RPH-CPP sent at 04/22/2018 10:41 AM EST ----- Needs PA for [facility] please. Only has #4 tablets left : /

## 2018-04-23 NOTE — Telephone Encounter (Signed)
Per [employer], over ride in place and PA is no longer required. Valid 04/22/18-04/24/19

## 2018-04-23 NOTE — Telephone Encounter (Signed)
Prior authorization through [place] was initiated for Entresto 97-103 medication and sent via [place] on 04/23/2018.   Prior authorization through [place] was APPROVED for Delene Loll 97-103 and will expire on 04/24/2019.

## 2018-05-03 ENCOUNTER — Other Ambulatory Visit (HOSPITAL_COMMUNITY): Payer: Self-pay | Admitting: Internal Medicine

## 2018-05-11 ENCOUNTER — Ambulatory Visit (INDEPENDENT_AMBULATORY_CARE_PROVIDER_SITE_OTHER): Payer: 59 | Admitting: Internal Medicine

## 2018-05-11 ENCOUNTER — Encounter: Payer: Self-pay | Admitting: Internal Medicine

## 2018-05-11 VITALS — BP 146/84 | HR 69 | Ht 68.0 in | Wt 230.2 lb

## 2018-05-11 DIAGNOSIS — I255 Ischemic cardiomyopathy: Secondary | ICD-10-CM | POA: Diagnosis not present

## 2018-05-11 DIAGNOSIS — I493 Ventricular premature depolarization: Secondary | ICD-10-CM | POA: Diagnosis not present

## 2018-05-11 DIAGNOSIS — I2510 Atherosclerotic heart disease of native coronary artery without angina pectoris: Secondary | ICD-10-CM

## 2018-05-11 DIAGNOSIS — I5020 Unspecified systolic (congestive) heart failure: Secondary | ICD-10-CM | POA: Diagnosis not present

## 2018-05-11 MED ORDER — HYDRALAZINE HCL 100 MG PO TABS: 100.0000 mg | ORAL_TABLET | Freq: Three times a day (TID) | ORAL | 3 refills | Status: DC

## 2018-05-11 NOTE — Patient Instructions (Addendum)
Medication Instructions:  Your physician has recommended you make the following change in your medication:   Increase your Hydralazine to 170m, three times daily.   Labwork: None ordered.  Testing/Procedures:  Please schedule for a 6 day zio patch monitor.   Your physician has recommended that you wear an event monitor. Event monitors are medical devices that record the heart's electrical activity. Doctors most often uKoreathese monitors to diagnose arrhythmias. Arrhythmias are problems with the speed or rhythm of the heartbeat. The monitor is a small, portable device. You can wear one while you do your normal daily activities. This is usually used to diagnose what is causing palpitations/syncope (passing out).   Follow-Up: Your physician recommends that you schedule a follow-up appointment in:   Next month with Heart Failure Clinic  9 months with Dr KCaryl Comes Remote monitoring is used to monitor your ICD from home. This monitoring reduces the number of office visits required to check your device to one time per year. It allows uKoreato keep an eye on the functioning of your device to ensure it is working properly. You are scheduled for a device check from home on 4/8. You may send your transmission at any time that day. If you have a wireless device, the transmission will be sent automatically. After your physician reviews your transmission, you will receive a postcard with your next transmission date.   Any Other Special Instructions Will Be Listed Below (If Applicable).     If you need a refill on your cardiac medications before your next appointment, please call your pharmacy.

## 2018-05-11 NOTE — Progress Notes (Signed)
Patient Care Team: Helane Rima, MD as PCP - General (Family Medicine)   HPI  Christopher Roth is a 48 y.o. male seen in follow-up for an ICD implanted 8/19 for primary prevention in the setting of ischemic heart disease.  DATE TEST EF   12/17 LHC 10.15% LAD T; residual 3 V CAD  12/17.e  Echo   25-30 %   3/18 cMRI  41 %  full thickness scar Anterior wall  81/9 Echo  25-30%    The patient denies chest pain, shortness of breath, nocturnal dyspnea, orthopnea or peripheral edema.  There have been no palpitations, lightheadedness or syncope.    Some itching on his ICD site     Past Medical History:  Diagnosis Date  . Acute systolic congestive heart failure ([place]) 04/22/2016  . Diabetes mellitus without complication ([place])   . Hypertension   . Tobacco use disorder 11/21/2014    Past Surgical History:  Procedure Laterality Date  . CARDIAC CATHETERIZATION N/A 04/23/2016   Procedure: Right/Left Heart Cath and Coronary Angiography;  Surgeon: Belva Crome, MD;  Location: [place] [facility];  Service: Cardiovascular;  Laterality: N/A;  . ICD IMPLANT N/A 01/27/2018   Procedure: ICD IMPLANT;  Surgeon: Deboraha Sprang, MD;  Location: [place] [facility];  Service: Cardiovascular;  Laterality: N/A;  . INCISION AND DRAINAGE PERIRECTAL ABSCESS Right 11/17/2014   Procedure: debridement of right perianal and groin wound;  Surgeon: Johnathan Hausen, MD;  Location: [facility];  Service: General;  Laterality: Right;    Current Meds  Medication Sig  . albuterol (PROVENTIL HFA;VENTOLIN HFA) 108 (90 Base) MCG/ACT inhaler Inhale 2 puffs into the lungs every 6 (six) hours as needed for wheezing or shortness of breath.  . Alcohol Swabs PADS 1 Package by Does not apply route as needed (with blood glucose checks).  Marland Kitchen aspirin EC 81 MG tablet Take 81 mg by mouth daily.  Marland Kitchen atorvastatin (LIPITOR) 80 MG tablet TAKE 1 TABLET BY MOUTH DAILY AT 6 PM  . carvedilol (COREG) 6.25 MG tablet Take 1 tablet (6.25 mg  total) by mouth 2 (two) times daily with a meal. Last refill w/o appt. Please call [phone]  . furosemide (LASIX) 20 MG tablet Take 1 tablet (20 mg total) by mouth as needed for fluid.  Marland Kitchen glipiZIDE (GLUCOTROL XL) 10 MG 24 hr tablet Take 20 mg by mouth 2 (two) times daily.   Marland Kitchen glucose blood (CHOICE DM FORA G20 TEST STRIPS) test strip Use as instructed  . glucose monitoring kit (FREESTYLE) monitoring kit 1 each by Does not apply route as needed for other.  . hydrALAZINE (APRESOLINE) 50 MG tablet TAKE 1 & 1/2 (ONE & ONE-HALF) TABLETS BY MOUTH THREE TIMES DAILY  . isosorbide mononitrate (IMDUR) 30 MG 24 hr tablet Take 1 tablet (30 mg total) by mouth daily.  . Lancets (FREESTYLE) lancets Use as instructed  . metFORMIN (GLUCOPHAGE-XR) 500 MG 24 hr tablet Take 500 mg by mouth 2 (two) times daily.   . potassium chloride SA (K-DUR,KLOR-CON) 20 MEQ tablet Take 2 tablets (40 mEq total) by mouth daily.  . sacubitril-valsartan (ENTRESTO) 97-103 MG Take 1 tablet by mouth 2 (two) times daily.  Marland Kitchen spironolactone (ALDACTONE) 25 MG tablet Take 1 tablet (25 mg total) by mouth daily.    Allergies  Allergen Reactions  . Januvia [Sitagliptin] Hives  . Nickel Rash      Review of Systems negative except from HPI and PMH  Physical Exam BP Marland Kitchen)  146/84   Pulse 69   Ht _0  (1.727 m)   Wt 230 lb 3.2 oz (104.4 kg)   SpO2 95%   BMI 35.00 kg/m  Well developed and well nourished in no acute distress HENT normal E scleral and icterus clear Neck Supple JVP flat; carotids brisk and full Clear to ausculation Device pocket well healed; without hematoma or erythema.  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  Outputs were reprogrammed.  ECG is notable for frequent PVCs with right bundle superior axis morphology.  Potentially contributing to his cardiomyopathy.  Reviewed CV strips from the hospital and found frequent PVCs on 1 of the 2 recorded strips.  Hence, we will undertake monitoring to look for PVC burden.  Blood pressure remains elevated.  Will reach out to heart failure clinic--recommendation was to increase hydralazine from 75--100.  Current medicines are reviewed at length with the patient today .  The patient does not  have concerns regarding medicines.  We spent more than 50% of our >25 min visit in face to face counseling regarding the above

## 2018-05-12 ENCOUNTER — Ambulatory Visit (HOSPITAL_COMMUNITY)
Admission: RE | Admit: 2018-05-12 | Discharge: 2018-05-12 | Disposition: A | Discharge status: Home / Self Care | Payer: 59 | Source: Ambulatory Visit | Attending: Internal Medicine | Admitting: Internal Medicine

## 2018-05-12 DIAGNOSIS — I493 Ventricular premature depolarization: Secondary | ICD-10-CM

## 2018-05-12 LAB — CUP PACEART INCLINIC DEVICE CHECK
HIGH POWER IMPEDANCE MEASURED VALUE: 62 Ohm
Implantable Lead Implant Date: 20190925
Implantable Lead Serial Number: 441633
Implantable Pulse Generator Implant Date: 20190925
Lead Channel Impedance Value: 604 Ohm
Lead Channel Pacing Threshold Pulse Width: 0.4 ms
Lead Channel Sensing Intrinsic Amplitude: 20.5 mV
Lead Channel Setting Pacing Amplitude: 2.5 V
MDC IDC LEAD LOCATION: 753860
MDC IDC MSMT LEADCHNL RV PACING THRESHOLD AMPLITUDE: 1.1 V
MDC IDC SESS DTM: 20200107050000
MDC IDC SET LEADCHNL RV PACING PULSEWIDTH: 0.4 ms
MDC IDC SET LEADCHNL RV SENSING SENSITIVITY: 0.5 mV
Pulse Gen Serial Number: 244469

## 2018-06-10 NOTE — Progress Notes (Signed)
Advanced Heart Failure Clinic Note   Referring Physician: Crenshaw Primary Care: [name], MD at [facility] family Primary Cardiologist: Dr Stanford Breed HF: Dr. [name]  HPI: Christopher Roth is a 48 y.o. male with PMH of HTN, DM2, Tobacco abuse, CAD, and systolic CHF. S/P [employer] ICD 01/2018.   Admitted 04/20/16 -> 04/25/16 with new onest HF symptoms. Echo 04/21/16 LVEF 25-30%, Grade 2 DD. Diuresed 12 lbs. LHC with significant CAD as below.  Evaluated 11/25/17 in ED with chest pain. Not thouught to be ACS. Troponin 0.0. BNP 1397. He was instructed to double lasix for 3 days and follow up with HF.  Today he returns for HF follow up. Overall feeling fine. Denies SOB/PND/Orthopnea. Appetite ok. No fever or chills. Not weighing at home. Taking all medications but ran out of potassium. Continues to work full time. Rarely smokes a cigar.   Social: Lives in [place], Wife and Son  ECHO 12/2017 EF 25-30%  cMRI 07/16/16 -> recalculated 08/19/16: EF 41% scar in LAD distribution. No viability   LHC 04/23/16 - Recent total occlusion of the proximal to mid LAD.  - 70% ramus intermedius, 65% ostial circumflex followed by 70% mid circumflex, 75% proximal LAD prior to the origin of a small to moderate size diagonal, and 90% mid PDA. - Mild PAH, PCWP 14 - LVEF 10-15% Hemodynamics RHC Procedural Findings: Hemodynamics (mmHg) RA mean 4 RV 39/1 PA 37/12 PCWP 14 AO 115/78 Cardiac Output (Fick) 5.63 Cardiac Index (Fick) 2.71  Past Medical History:  Diagnosis Date  . Acute systolic congestive heart failure ([place]) 04/22/2016  . Diabetes mellitus without complication ([place])   . Hypertension   . Tobacco use disorder 11/21/2014    Current Outpatient Medications  Medication Sig Dispense Refill  . albuterol (PROVENTIL HFA;VENTOLIN HFA) 108 (90 Base) MCG/ACT inhaler Inhale 2 puffs into the lungs every 6 (six) hours as needed for wheezing or shortness of breath.    . Alcohol Swabs PADS 1  Package by Does not apply route as needed (with blood glucose checks). 1 each 0  . aspirin EC 81 MG tablet Take 81 mg by mouth daily.    Marland Kitchen atorvastatin (LIPITOR) 80 MG tablet TAKE 1 TABLET BY MOUTH DAILY AT 6 PM 30 tablet 3  . carvedilol (COREG) 6.25 MG tablet Take 1 tablet (6.25 mg total) by mouth 2 (two) times daily with a meal. Last refill w/o appt. Please call [phone] 60 tablet 0  . furosemide (LASIX) 20 MG tablet Take 1 tablet (20 mg total) by mouth as needed for fluid. 30 tablet 11  . glipiZIDE (GLUCOTROL XL) 10 MG 24 hr tablet Take 20 mg by mouth 2 (two) times daily.     Marland Kitchen glucose blood (CHOICE DM FORA G20 TEST STRIPS) test strip Use as instructed 100 each 11  . glucose monitoring kit (FREESTYLE) monitoring kit 1 each by Does not apply route as needed for other. 1 each 0  . hydrALAZINE (APRESOLINE) 100 MG tablet Take 1 tablet (100 mg total) by mouth 3 (three) times daily. 270 tablet 3  . isosorbide mononitrate (IMDUR) 30 MG 24 hr tablet Take 1 tablet (30 mg total) by mouth daily. 30 tablet 11  . Lancets (FREESTYLE) lancets Use as instructed 100 each 11  . metFORMIN (GLUCOPHAGE-XR) 500 MG 24 hr tablet Take 500 mg by mouth 2 (two) times daily.     . potassium chloride SA (K-DUR,KLOR-CON) 20 MEQ tablet Take 2 tablets (40 mEq total) by mouth daily. 60 tablet 3  .  sacubitril-valsartan (ENTRESTO) 97-103 MG Take 1 tablet by mouth 2 (two) times daily. 180 tablet 1  . spironolactone (ALDACTONE) 25 MG tablet Take 1 tablet (25 mg total) by mouth daily. 30 tablet 11   No current facility-administered medications for this encounter.     Allergies  Allergen Reactions  . Januvia [Sitagliptin] Hives  . Nickel Rash     Social History   Socioeconomic History  . Marital status: Married    Spouse name: Not on file  . Number of children: Not on file  . Years of education: Not on file  . Highest education level: Not on file  Occupational History  . Not on file  Social Needs  . Financial  resource strain: Not on file  . Food insecurity:    Worry: Not on file    Inability: Not on file  . Transportation needs:    Medical: Not on file    Non-medical: Not on file  Tobacco Use  . Smoking status: Former Smoker    Packs/day: 1.00    Years: 24.00    Pack years: 24.00    Types: Cigarettes    Last attempt to quit: 03/27/2016    Years since quitting: 2.2  . Smokeless tobacco: Never Used  Substance and Sexual Activity  . Alcohol use: Yes  . Drug use: No  . Sexual activity: Not on file  Lifestyle  . Physical activity:    Days per week: Not on file    Minutes per session: Not on file  . Stress: Not on file  Relationships  . Social connections:    Talks on phone: Not on file    Gets together: Not on file    Attends religious service: Not on file    Active member of club or organization: Not on file    Attends meetings of clubs or organizations: Not on file    Relationship status: Not on file  . Intimate partner violence:    Fear of current or ex partner: Not on file    Emotionally abused: Not on file    Physically abused: Not on file    Forced sexual activity: Not on file  Other Topics Concern  . Not on file  Social History Narrative  . Not on file   Family History  Problem Relation Age of Onset  . Diabetes Mellitus II Mother     Vitals:   06/11/18 1046  BP: (!) 142/92  Pulse: 78  SpO2: 99%  Weight: 102.7 kg (226 lb 6.4 oz)   Wt Readings from Last 3 Encounters:  06/11/18 102.7 kg (226 lb 6.4 oz)  05/11/18 104.4 kg (230 lb 3.2 oz)  01/28/18 98.5 kg (217 lb 3.2 oz)   PHYSICAL EXAM: General:  Well appearing. No resp difficulty HEENT: normal Neck: supple. no JVD. Carotids 2+ bilat; no bruits. No lymphadenopathy or thryomegaly appreciated. Cor: PMI nondisplaced. Regular rate & rhythm. No rubs, gallops or murmurs. Left upper chest ICD  Lungs: clear Abdomen: soft, nontender, nondistended. No hepatosplenomegaly. No bruits or masses. Good bowel  sounds. Extremities: no cyanosis, clubbing, rash, edema Neuro: alert & orientedx3, cranial nerves grossly intact. moves all 4 extremities w/o difficulty. Affect pleasant    ASSESSMENT & PLAN:  1. Chronic combined systolic and diastolic CHF -  ICM with Echo 04/2016 LVEF 25-30%, Grade 2 DD (EF 10-15% by cath) - Cath with chronic occlusion of LAD with no viability on MRI. Moderate non-obstructive CAD elsewhere - cMRI 07/16/16 -> recalculated 08/2016 EF  41%. Anterior scar with no significant viability - S/P [employer] ICD 01/2018  -NYHA I.  Volume status stable.  Continue lasix as needed.  - Continue Coreg 6.25 mg BID. Historically up-titration has been limited by bradycardia.  - Continue Entresto to 97/171m BID.  - Continue spiro 25 mg daily.  - Continue hydralazine 100 mg three times a day.  - Increase imdur 60 mg daily  Check BMEt today.  2. [place] - Chronic occlusion of LAD with no viability on MRI. Moderate non-obstructive CAD elsewhere -No s/s ischemia  - Continue ASA and statin 3. HTN - Elevated. Increase imdur  4. DMII - Per PCP.   5. Tobacco abuse - Quit 2017   Follow up in 4 months with Dr BHaroldine Laws   ADarrick Grinder NP  11:04 AM

## 2018-06-11 ENCOUNTER — Ambulatory Visit (HOSPITAL_COMMUNITY)
Admission: RE | Admit: 2018-06-11 | Discharge: 2018-06-11 | Disposition: A | Discharge status: Home / Self Care | Payer: 59 | Source: Ambulatory Visit | Attending: Internal Medicine | Admitting: Internal Medicine

## 2018-06-11 ENCOUNTER — Encounter (HOSPITAL_COMMUNITY): Payer: Self-pay

## 2018-06-11 VITALS — BP 142/92 | HR 78 | Wt 226.4 lb

## 2018-06-11 DIAGNOSIS — Z87891 Personal history of nicotine dependence: Secondary | ICD-10-CM | POA: Diagnosis not present

## 2018-06-11 DIAGNOSIS — Z833 Family history of diabetes mellitus: Secondary | ICD-10-CM | POA: Insufficient documentation

## 2018-06-11 DIAGNOSIS — Z888 Allergy status to other drugs, medicaments and biological substances status: Secondary | ICD-10-CM | POA: Diagnosis not present

## 2018-06-11 DIAGNOSIS — Z79899 Other long term (current) drug therapy: Secondary | ICD-10-CM | POA: Insufficient documentation

## 2018-06-11 DIAGNOSIS — I10 Essential (primary) hypertension: Secondary | ICD-10-CM

## 2018-06-11 DIAGNOSIS — Z91048 Other nonmedicinal substance allergy status: Secondary | ICD-10-CM | POA: Insufficient documentation

## 2018-06-11 DIAGNOSIS — I5020 Unspecified systolic (congestive) heart failure: Secondary | ICD-10-CM | POA: Diagnosis not present

## 2018-06-11 DIAGNOSIS — Z7982 Long term (current) use of aspirin: Secondary | ICD-10-CM | POA: Insufficient documentation

## 2018-06-11 DIAGNOSIS — I5042 Chronic combined systolic (congestive) and diastolic (congestive) heart failure: Secondary | ICD-10-CM | POA: Insufficient documentation

## 2018-06-11 DIAGNOSIS — I2510 Atherosclerotic heart disease of native coronary artery without angina pectoris: Secondary | ICD-10-CM | POA: Diagnosis not present

## 2018-06-11 DIAGNOSIS — I110 Hypertensive heart disease with heart failure: Secondary | ICD-10-CM | POA: Diagnosis not present

## 2018-06-11 DIAGNOSIS — R001 Bradycardia, unspecified: Secondary | ICD-10-CM | POA: Insufficient documentation

## 2018-06-11 DIAGNOSIS — E119 Type 2 diabetes mellitus without complications: Secondary | ICD-10-CM | POA: Diagnosis not present

## 2018-06-11 DIAGNOSIS — Z7984 Long term (current) use of oral hypoglycemic drugs: Secondary | ICD-10-CM | POA: Diagnosis not present

## 2018-06-11 LAB — BASIC METABOLIC PANEL
ANION GAP: 7 (ref 5–15)
BUN: 11 mg/dL (ref 6–20)
CO2: 26 mmol/L (ref 22–32)
Calcium: 9 mg/dL (ref 8.9–10.3)
Chloride: 109 mmol/L (ref 98–111)
Creatinine, Ser: 1.06 mg/dL (ref 0.61–1.24)
GFR calc non Af Amer: >60 mL/min (ref >60)
Glucose, Bld: 193 mg/dL — ABNORMAL HIGH (ref 70–99)
POTASSIUM: 4 mmol/L (ref 3.5–5.1)
SODIUM: 142 mmol/L (ref 135–145)

## 2018-06-11 MED ORDER — CARVEDILOL 6.25 MG PO TABS: 6.2500 mg | ORAL_TABLET | Freq: Two times a day (BID) | ORAL | 0 refills | Status: DC

## 2018-06-11 MED ORDER — HYDRALAZINE HCL 100 MG PO TABS: 100.0000 mg | ORAL_TABLET | Freq: Three times a day (TID) | ORAL | 3 refills | Status: DC

## 2018-06-11 MED ORDER — ISOSORBIDE MONONITRATE ER 60 MG PO TB24: 60.0000 mg | ORAL_TABLET | Freq: Every day | ORAL | 3 refills | Status: DC

## 2018-06-11 NOTE — Patient Instructions (Addendum)
Labs were done today. We will call you with any ABNORMAL results. No news is good news!  INCREASE Imdur to 13m daily.  636mImdur, 10055mydralazine, and 6.80m41mreg have been sent to your pharmacy.   Your physician recommends that you schedule a follow-up appointment in: 4 months with Dr BensHaroldine Laws

## 2018-06-14 ENCOUNTER — Telehealth (HOSPITAL_COMMUNITY): Payer: Self-pay

## 2018-06-14 NOTE — Telephone Encounter (Signed)
-----   Message from Jolaine Artist, MD sent at 06/13/2018  1:16 PM EST ----- Brief runs of SVT and NSVT. Continue current therapy.

## 2018-06-14 NOTE — Telephone Encounter (Signed)
Bensimhon, Shaune Pascal, MD sent to Marlise Eves, RN        Brief runs of SVT and NSVT. Continue current therapy.    Spoke with pt. He's aware and verbalized understanding.

## 2018-07-12 ENCOUNTER — Other Ambulatory Visit (HOSPITAL_COMMUNITY): Payer: Self-pay | Admitting: Internal Medicine

## 2018-07-12 ENCOUNTER — Other Ambulatory Visit (HOSPITAL_COMMUNITY): Payer: Self-pay

## 2018-07-12 MED ORDER — CARVEDILOL 6.25 MG PO TABS: 6.2500 mg | ORAL_TABLET | Freq: Two times a day (BID) | ORAL | 11 refills | Status: DC

## 2018-07-23 ENCOUNTER — Other Ambulatory Visit (HOSPITAL_COMMUNITY): Payer: Self-pay | Admitting: *Deleted

## 2018-07-23 MED ORDER — FUROSEMIDE 20 MG PO TABS: 20.0000 mg | ORAL_TABLET | ORAL | 11 refills | Status: DC | PRN

## 2018-08-11 ENCOUNTER — Ambulatory Visit (INDEPENDENT_AMBULATORY_CARE_PROVIDER_SITE_OTHER): Payer: 59 | Admitting: *Deleted

## 2018-08-11 ENCOUNTER — Other Ambulatory Visit: Payer: Self-pay

## 2018-08-11 DIAGNOSIS — I255 Ischemic cardiomyopathy: Secondary | ICD-10-CM

## 2018-08-11 DIAGNOSIS — I5022 Chronic systolic (congestive) heart failure: Secondary | ICD-10-CM

## 2018-08-12 LAB — CUP PACEART REMOTE DEVICE CHECK
Battery Remaining Longevity: 180 mo
Brady Statistic RV Percent Paced: 0 %
Date Time Interrogation Session: 20200409125824
HighPow Impedance: 61 Ohm
Implantable Lead Implant Date: 20190925
Implantable Lead Location: 753860
Implantable Lead Model: 293
Implantable Lead Serial Number: 441633
Implantable Pulse Generator Implant Date: 20190925
Lead Channel Impedance Value: 544 Ohm
Lead Channel Sensing Intrinsic Amplitude: 21 mV
Lead Channel Setting Pacing Amplitude: 2.5 V
Lead Channel Setting Pacing Pulse Width: 0.4 ms
Lead Channel Setting Sensing Sensitivity: 0.5 mV
Pulse Gen Serial Number: 244469

## 2018-08-13 ENCOUNTER — Other Ambulatory Visit (HOSPITAL_COMMUNITY): Payer: Self-pay | Admitting: Cardiology

## 2018-08-13 MED ORDER — CARVEDILOL 6.25 MG PO TABS: 6.2500 mg | ORAL_TABLET | Freq: Two times a day (BID) | ORAL | 3 refills | Status: DC

## 2018-08-13 NOTE — Telephone Encounter (Signed)
Patient called to request refills on coreg  refills sent

## 2018-08-20 NOTE — Progress Notes (Signed)
Remote ICD transmission.   

## 2018-08-27 ENCOUNTER — Telehealth (HOSPITAL_COMMUNITY): Payer: Self-pay | Admitting: Cardiology

## 2018-08-27 MED ORDER — ATORVASTATIN CALCIUM 80 MG PO TABS: ORAL_TABLET | ORAL | 3 refills | Status: DC

## 2018-08-27 NOTE — Telephone Encounter (Signed)
MEDICATION REFILLS REQUESTED RETURNED TO [facility]

## 2018-10-13 ENCOUNTER — Encounter (HOSPITAL_COMMUNITY): Payer: 59 | Admitting: Internal Medicine

## 2018-10-15 ENCOUNTER — Other Ambulatory Visit (HOSPITAL_COMMUNITY): Payer: Self-pay

## 2018-10-15 MED ORDER — ENTRESTO 97-103 MG PO TABS: 1.0000 | ORAL_TABLET | Freq: Two times a day (BID) | ORAL | 2 refills | Status: DC

## 2018-10-15 MED ORDER — ISOSORBIDE MONONITRATE ER 60 MG PO TB24: 60.0000 mg | ORAL_TABLET | Freq: Every day | ORAL | 3 refills | Status: DC

## 2018-10-26 ENCOUNTER — Telehealth (HOSPITAL_COMMUNITY): Payer: Self-pay | Admitting: *Deleted

## 2018-10-26 DIAGNOSIS — R0600 Dyspnea, unspecified: Secondary | ICD-10-CM | POA: Insufficient documentation

## 2018-10-26 NOTE — Telephone Encounter (Signed)
Pt called to report he woke up at about 3 this morning very SOB, he states he did take some Lasix today and is feeling better now.  He does not weigh himself daily, he reports LE edema in feet most days as he is on his feet all day but reports it is always better in AM.  We discussed importance of daily wt and taking Lasix as needed, he will start weighing daily and monitor, if SOB returns and Lasix is not helping or he has to take it more frequently he will call us back.

## 2018-11-10 ENCOUNTER — Ambulatory Visit (INDEPENDENT_AMBULATORY_CARE_PROVIDER_SITE_OTHER): Payer: 59 | Admitting: *Deleted

## 2018-11-10 DIAGNOSIS — I255 Ischemic cardiomyopathy: Secondary | ICD-10-CM

## 2018-11-10 LAB — CUP PACEART REMOTE DEVICE CHECK
Date Time Interrogation Session: 20200708123550
Implantable Lead Implant Date: 20190925
Implantable Lead Location: 753860
Implantable Lead Model: 293
Implantable Lead Serial Number: 441633
Implantable Pulse Generator Implant Date: 20190925
Pulse Gen Serial Number: 244469

## 2018-11-22 ENCOUNTER — Encounter: Payer: Self-pay | Admitting: Cardiology

## 2018-11-22 NOTE — Progress Notes (Signed)
Remote ICD transmission.   

## 2018-12-01 ENCOUNTER — Telehealth (HOSPITAL_COMMUNITY): Payer: Self-pay | Admitting: *Deleted

## 2018-12-01 NOTE — Telephone Encounter (Signed)
Bidil samples at front desk for pick up.

## 2018-12-02 ENCOUNTER — Other Ambulatory Visit (HOSPITAL_COMMUNITY): Payer: Self-pay | Admitting: *Deleted

## 2018-12-02 ENCOUNTER — Telehealth (HOSPITAL_COMMUNITY): Payer: Self-pay

## 2018-12-02 NOTE — Telephone Encounter (Signed)
Samples requested per [facility] CMA. Left at front desk  Medication Samples have been provided to the patient.  Drug name: bidil       Strength: 20/37.5        Qty: 36  LOT: [id number]  Exp.Date: 12/2018  Dosing instructions: 2 tabs three times a day  The patient has been instructed regarding the correct time, dose, and frequency of taking this medication, including desired effects and most common side effects.   Christopher Roth 9:05 AM 12/02/2018

## 2018-12-02 NOTE — Telephone Encounter (Signed)
error 

## 2018-12-02 NOTE — Telephone Encounter (Signed)
Pt said he has not been taking imdur and hydralazine he has been provided bidil samples b/c he couldn't afford it he takes 2 tabs 3 times daily

## 2019-01-06 ENCOUNTER — Other Ambulatory Visit (HOSPITAL_COMMUNITY): Payer: Self-pay | Admitting: *Deleted

## 2019-01-06 MED ORDER — ATORVASTATIN CALCIUM 80 MG PO TABS: ORAL_TABLET | ORAL | 0 refills | Status: DC

## 2019-01-07 ENCOUNTER — Telehealth (HOSPITAL_COMMUNITY): Payer: Self-pay | Admitting: *Deleted

## 2019-01-07 NOTE — Telephone Encounter (Signed)
Pt left vm stating he was having vascular problems and problems with circulation. I called pt back to get more information. No answer/left vm

## 2019-01-11 ENCOUNTER — Other Ambulatory Visit (HOSPITAL_COMMUNITY): Payer: Self-pay | Admitting: Adult Health

## 2019-01-11 MED ORDER — BIDIL 20-37.5 MG PO TABS: 2.0000 | ORAL_TABLET | Freq: Three times a day (TID) | ORAL | 0 refills | Status: DC

## 2019-01-12 ENCOUNTER — Other Ambulatory Visit (HOSPITAL_COMMUNITY): Payer: Self-pay | Admitting: *Deleted

## 2019-01-12 ENCOUNTER — Telehealth (HOSPITAL_COMMUNITY): Payer: Self-pay | Admitting: *Deleted

## 2019-01-12 DIAGNOSIS — R0989 Other specified symptoms and signs involving the circulatory and respiratory systems: Secondary | ICD-10-CM

## 2019-01-12 NOTE — Telephone Encounter (Signed)
Pt called stating he was having some circulation issues and was referred to [facility] vascular specialists with Dr.Rickey. pt has an appt with them on 9/1 and was told ne needed surgery. Pt would like Dr.Bensimhon to review the office note from visit with Dr.Rickey and see if he agrees with surgery. I called [facility] vascular specialist at [phone] and asked them to fax the last office note. I will forward to Dr.Bensimhon and follow up with patient.

## 2019-01-13 ENCOUNTER — Telehealth (HOSPITAL_COMMUNITY): Payer: Self-pay | Admitting: *Deleted

## 2019-01-13 NOTE — Telephone Encounter (Signed)
The above patient or their representative was contacted and gave the following answers to these questions:         Do you have any of the following symptoms?n  Fever                    Cough                   Shortness of breath  Do  you have any of the following other symptoms? n  muscle pain         vomiting,        diarrhea        rash         weakness        red eye        abdominal pain         bruising          bruising or bleeding              joint pain           severe headache    Have you been in contact with someone who was or has been sick in the past 2 weeks?n  Yes                 Unsure                         Unable to assess   Does the person that you were in contact with have any of the following symptoms?   Cough         shortness of breath           muscle pain         vomiting,            diarrhea            rash            weakness           fever            red eye           abdominal pain           bruising  or  bleeding                joint pain                severe headache               Have you  or someone you have been in contact with traveled internationally in th last month?         If yes, which countries?   Have you  or someone you have been in contact with traveled outside [place] in th last month?         If yes, which state and city?   COMMENTS OR ACTION PLAN FOR THIS PATIENT:         q

## 2019-01-14 ENCOUNTER — Other Ambulatory Visit: Payer: Self-pay | Admitting: *Deleted

## 2019-01-14 ENCOUNTER — Ambulatory Visit (HOSPITAL_COMMUNITY)
Admission: RE | Admit: 2019-01-14 | Discharge: 2019-01-14 | Disposition: A | Discharge status: Home / Self Care | Payer: 59 | Source: Ambulatory Visit | Attending: Vascular Surgery | Admitting: Vascular Surgery

## 2019-01-14 ENCOUNTER — Other Ambulatory Visit: Payer: Self-pay

## 2019-01-14 ENCOUNTER — Encounter: Payer: Self-pay | Admitting: Vascular Surgery

## 2019-01-14 ENCOUNTER — Other Ambulatory Visit (HOSPITAL_COMMUNITY)
Admission: RE | Admit: 2019-01-14 | Discharge: 2019-01-14 | Disposition: A | Discharge status: Home / Self Care | Payer: 59 | Source: Ambulatory Visit | Attending: Surgery | Admitting: Surgery

## 2019-01-14 ENCOUNTER — Ambulatory Visit (INDEPENDENT_AMBULATORY_CARE_PROVIDER_SITE_OTHER): Payer: 59 | Admitting: Vascular Surgery

## 2019-01-14 ENCOUNTER — Encounter: Payer: Self-pay | Admitting: *Deleted

## 2019-01-14 VITALS — BP 130/84 | HR 68 | Temp 97.9°F | Resp 20 | Ht 68.0 in | Wt 215.0 lb

## 2019-01-14 DIAGNOSIS — I739 Peripheral vascular disease, unspecified: Secondary | ICD-10-CM

## 2019-01-14 DIAGNOSIS — Z20828 Contact with and (suspected) exposure to other viral communicable diseases: Secondary | ICD-10-CM | POA: Insufficient documentation

## 2019-01-14 NOTE — Progress Notes (Signed)
Patient ID: Christopher Roth, male   DOB: 1970/09/20, 48 y.o.   MRN: [id number]  Reason for Consult: New Patient (Initial Visit)   Referred by Helane Rima, MD  Subjective:     HPI:  Christopher Roth is a 48 y.o. male history of diabetes and congestive heart failure.  Now has defibrillator.  Has right toe ulceration has been present for at least several weeks.  Was scheduled to have angiography at [facility] wanted opinion of [facility].  Does not have any recent fevers.  Never had vascular invention in the past.  He does take aspirin and statin drugs no blood thinners.  Past Medical History:  Diagnosis Date  . Acute systolic congestive heart failure ([place]) 04/22/2016  . Diabetes mellitus without complication ([place])   . Hypertension   . Tobacco use disorder 11/21/2014   Family History  Problem Relation Age of Onset  . Diabetes Mellitus II Mother    Past Surgical History:  Procedure Laterality Date  . CARDIAC CATHETERIZATION N/A 04/23/2016   Procedure: Right/Left Heart Cath and Coronary Angiography;  Surgeon: Belva Crome, MD;  Location: [place] [facility];  Service: Cardiovascular;  Laterality: N/A;  . ICD IMPLANT N/A 01/27/2018   Procedure: ICD IMPLANT;  Surgeon: Deboraha [place]ang, MD;  Location: [place] [facility];  Service: Cardiovascular;  Laterality: N/A;  . INCISION AND DRAINAGE PERIRECTAL ABSCESS Right 11/17/2014   Procedure: debridement of right perianal and groin wound;  Surgeon: Johnathan Hausen, MD;  Location: [facility];  Service: General;  Laterality: Right;    Short Social History:  Social History   Tobacco Use  . Smoking status: Former Smoker    Packs/day: 1.00    Years: 24.00    Pack years: 24.00    Types: Cigarettes    Quit date: 03/27/2016    Years since quitting: 2.8  . Smokeless tobacco: Never Used  Substance Use Topics  . Alcohol use: Yes    Allergies  Allergen Reactions  . Nickel Rash    Current Outpatient Medications  Medication Sig Dispense Refill  .  albuterol (PROVENTIL HFA;VENTOLIN HFA) 108 (90 Base) MCG/ACT inhaler Inhale 2 puffs into the lungs every 6 (six) hours as needed for wheezing or shortness of breath.    . Alcohol Swabs PADS 1 Package by Does not apply route as needed (with blood glucose checks). 1 each 0  . amoxicillin-clavulanate (AUGMENTIN) 875-125 MG tablet Take by mouth.    Marland Kitchen aspirin EC 81 MG tablet Take 81 mg by mouth daily.    Marland Kitchen atorvastatin (LIPITOR) 80 MG tablet TAKE 1 TABLET BY MOUTH DAILY AT 6 PM 30 tablet 0  . carvedilol (COREG) 6.25 MG tablet Take 1 tablet (6.25 mg total) by mouth 2 (two) times daily with a meal. 180 tablet 3  . ciprofloxacin (CIPRO) 500 MG tablet Take by mouth.    . co-enzyme Q-10 30 MG capsule Take by mouth.    . dapagliflozin propanediol (FARXIGA) 5 MG TABS tablet Take by mouth.    . furosemide (LASIX) 20 MG tablet Take 1 tablet (20 mg total) by mouth as needed for fluid. 30 tablet 11  . glipiZIDE (GLUCOTROL XL) 10 MG 24 hr tablet Take 20 mg by mouth 2 (two) times daily.     Marland Kitchen glucose blood (CHOICE DM FORA G20 TEST STRIPS) test strip Use as instructed 100 each 11  . glucose monitoring kit (FREESTYLE) monitoring kit 1 each by Does not apply route as needed for other. 1 each  0  . isosorbide-hydrALAZINE (BIDIL) 20-37.5 MG tablet Take 2 tablets by mouth 3 (three) times daily. 180 tablet 0  . Lancets (FREESTYLE) lancets Use as instructed 100 each 11  . metFORMIN (GLUCOPHAGE-XR) 500 MG 24 hr tablet Take 500 mg by mouth 2 (two) times daily.     . potassium chloride SA (K-DUR,KLOR-CON) 20 MEQ tablet Take 2 tablets (40 mEq total) by mouth daily. 60 tablet 3  . sacubitril-valsartan (ENTRESTO) 97-103 MG Take 1 tablet by mouth 2 (two) times daily. 180 tablet 2  . sitaGLIPtin (JANUVIA) 100 MG tablet Take by mouth.    . spironolactone (ALDACTONE) 25 MG tablet Take 1 tablet by mouth once daily 30 tablet 0   No current facility-administered medications for this visit.     Review of Systems  Constitutional:   Constitutional negative. HENT: HENT negative.  Eyes: Eyes negative.  Respiratory: Respiratory negative.  Cardiovascular: Cardiovascular negative.  GI: Gastrointestinal negative.  Musculoskeletal: Musculoskeletal negative.  Skin: Positive for wound.  Neurological: Neurological negative. Hematologic: Hematologic/lymphatic negative.  Psychiatric: Psychiatric negative.        Objective:  Objective   Vitals:   01/14/19 0902  BP: 130/84  Pulse: 68  Resp: 20  Temp: 97.9 F (36.6 C)  SpO2: 100%  Weight: 215 lb (97.5 kg)  Height: 5' 8" (1.727 m)   Body mass index is 32.69 kg/m.  Physical Exam HENT:     Head: Normocephalic.  Eyes:     Pupils: Pupils are equal, round, and reactive to light.  Neck:     Vascular: No carotid bruit.  Cardiovascular:     Rate and Rhythm: Regular rhythm.     Pulses:          Popliteal pulses are 1+ on the right side and 1+ on the left side.  Abdominal:     General: Abdomen is flat.     Palpations: Abdomen is soft.  Musculoskeletal: Normal range of motion.        General: Swelling present.     Right lower leg: Edema present.  Skin:    General: Skin is warm.     Comments: Right foot wound overlying the first metatarsal head  Neurological:     General: No focal deficit present.     Mental Status: He is alert.  Psychiatric:        Mood and Affect: Mood normal.        Behavior: Behavior normal.        Thought Content: Thought content normal.        Judgment: Judgment normal.     Data: I have independently turbid his ABIs to be 0.74 right with toe pressure 43 and monophasic and left 1.26 with toe pressure 89 biphasic at the posterior tibial artery.     Assessment/Plan:     48 year old male with right foot wound history of congestive heart failure.  On aspirin and statin.  Has decreased ABIs on the right relative to the left.  I do think I can feel weak popliteal pulse on the right suggesting tibial disease although possibly also has SFA  disease.  We will proceed with right lower extremity angiography from a left common femoral approach.  I discussed the risk and benefits and he demonstrates good understanding we will get him scheduled today.  Will be at high risk for at least right first toe amputation.     Waynetta Sandy MD [facility]

## 2019-01-14 NOTE — H&P (View-Only) (Signed)
Patient ID: Christopher Roth, male   DOB: 1970/09/20, 48 y.o.   MRN: [id number]  Reason for Consult: New Patient (Initial Visit)   Referred by Helane Rima, MD  Subjective:     HPI:  [name] is a 48 y.o. male history of diabetes and congestive heart failure.  Now has defibrillator.  Has right toe ulceration has been present for at least several weeks.  Was scheduled to have angiography at [facility] wanted opinion of [facility].  Does not have any recent fevers.  Never had vascular invention in the past.  He does take aspirin and statin drugs no blood thinners.  Past Medical History:  Diagnosis Date  . Acute systolic congestive heart failure ([place]) 04/22/2016  . Diabetes mellitus without complication ([place])   . Hypertension   . Tobacco use disorder 11/21/2014   Family History  Problem Relation Age of Onset  . Diabetes Mellitus II Mother    Past Surgical History:  Procedure Laterality Date  . CARDIAC CATHETERIZATION N/A 04/23/2016   Procedure: Right/Left Heart Cath and Coronary Angiography;  Surgeon: Belva Crome, MD;  Location: [place] [facility];  Service: Cardiovascular;  Laterality: N/A;  . ICD IMPLANT N/A 01/27/2018   Procedure: ICD IMPLANT;  Surgeon: Deboraha [place]ang, MD;  Location: [place] [facility];  Service: Cardiovascular;  Laterality: N/A;  . INCISION AND DRAINAGE PERIRECTAL ABSCESS Right 11/17/2014   Procedure: debridement of right perianal and groin wound;  Surgeon: Johnathan Hausen, MD;  Location: [facility];  Service: General;  Laterality: Right;    Short Social History:  Social History   Tobacco Use  . Smoking status: Former Smoker    Packs/day: 1.00    Years: 24.00    Pack years: 24.00    Types: Cigarettes    Quit date: 03/27/2016    Years since quitting: 2.8  . Smokeless tobacco: Never Used  Substance Use Topics  . Alcohol use: Yes    Allergies  Allergen Reactions  . Nickel Rash    Current Outpatient Medications  Medication Sig Dispense Refill  .  albuterol (PROVENTIL HFA;VENTOLIN HFA) 108 (90 Base) MCG/ACT inhaler Inhale 2 puffs into the lungs every 6 (six) hours as needed for wheezing or shortness of breath.    . Alcohol Swabs PADS 1 Package by Does not apply route as needed (with blood glucose checks). 1 each 0  . amoxicillin-clavulanate (AUGMENTIN) 875-125 MG tablet Take by mouth.    Marland Kitchen aspirin EC 81 MG tablet Take 81 mg by mouth daily.    Marland Kitchen atorvastatin (LIPITOR) 80 MG tablet TAKE 1 TABLET BY MOUTH DAILY AT 6 PM 30 tablet 0  . carvedilol (COREG) 6.25 MG tablet Take 1 tablet (6.25 mg total) by mouth 2 (two) times daily with a meal. 180 tablet 3  . ciprofloxacin (CIPRO) 500 MG tablet Take by mouth.    . co-enzyme Q-10 30 MG capsule Take by mouth.    . dapagliflozin propanediol (FARXIGA) 5 MG TABS tablet Take by mouth.    . furosemide (LASIX) 20 MG tablet Take 1 tablet (20 mg total) by mouth as needed for fluid. 30 tablet 11  . glipiZIDE (GLUCOTROL XL) 10 MG 24 hr tablet Take 20 mg by mouth 2 (two) times daily.     Marland Kitchen glucose blood (CHOICE DM FORA G20 TEST STRIPS) test strip Use as instructed 100 each 11  . glucose monitoring kit (FREESTYLE) monitoring kit 1 each by Does not apply route as needed for other. 1 each  0  . isosorbide-hydrALAZINE (BIDIL) 20-37.5 MG tablet Take 2 tablets by mouth 3 (three) times daily. 180 tablet 0  . Lancets (FREESTYLE) lancets Use as instructed 100 each 11  . metFORMIN (GLUCOPHAGE-XR) 500 MG 24 hr tablet Take 500 mg by mouth 2 (two) times daily.     . potassium chloride SA (K-DUR,KLOR-CON) 20 MEQ tablet Take 2 tablets (40 mEq total) by mouth daily. 60 tablet 3  . sacubitril-valsartan (ENTRESTO) 97-103 MG Take 1 tablet by mouth 2 (two) times daily. 180 tablet 2  . sitaGLIPtin (JANUVIA) 100 MG tablet Take by mouth.    . spironolactone (ALDACTONE) 25 MG tablet Take 1 tablet by mouth once daily 30 tablet 0   No current facility-administered medications for this visit.     Review of Systems  Constitutional:   Constitutional negative. HENT: HENT negative.  Eyes: Eyes negative.  Respiratory: Respiratory negative.  Cardiovascular: Cardiovascular negative.  GI: Gastrointestinal negative.  Musculoskeletal: Musculoskeletal negative.  Skin: Positive for wound.  Neurological: Neurological negative. Hematologic: Hematologic/lymphatic negative.  Psychiatric: Psychiatric negative.        Objective:  Objective   Vitals:   01/14/19 0902  BP: 130/84  Pulse: 68  Resp: 20  Temp: 97.9 F (36.6 C)  SpO2: 100%  Weight: 215 lb (97.5 kg)  Height: 5' 8" (1.727 m)   Body mass index is 32.69 kg/m.  Physical Exam HENT:     Head: Normocephalic.  Eyes:     Pupils: Pupils are equal, round, and reactive to light.  Neck:     Vascular: No carotid bruit.  Cardiovascular:     Rate and Rhythm: Regular rhythm.     Pulses:          Popliteal pulses are 1+ on the right side and 1+ on the left side.  Abdominal:     General: Abdomen is flat.     Palpations: Abdomen is soft.  Musculoskeletal: Normal range of motion.        General: Swelling present.     Right lower leg: Edema present.  Skin:    General: Skin is warm.     Comments: Right foot wound overlying the first metatarsal head  Neurological:     General: No focal deficit present.     Mental Status: He is alert.  Psychiatric:        Mood and Affect: Mood normal.        Behavior: Behavior normal.        Thought Content: Thought content normal.        Judgment: Judgment normal.     Data: I have independently turbid his ABIs to be 0.74 right with toe pressure 43 and monophasic and left 1.26 with toe pressure 89 biphasic at the posterior tibial artery.     Assessment/Plan:     48 year old male with right foot wound history of congestive heart failure.  On aspirin and statin.  Has decreased ABIs on the right relative to the left.  I do think I can feel weak popliteal pulse on the right suggesting tibial disease although possibly also has SFA  disease.  We will proceed with right lower extremity angiography from a left common femoral approach.  I discussed the risk and benefits and he demonstrates good understanding we will get him scheduled today.  Will be at high risk for at least right first toe amputation.     Waynetta Sandy MD [facility]

## 2019-01-16 LAB — NOVEL CORONAVIRUS, NAA (HOSP ORDER, SEND-OUT TO REF LAB; TAT 18-24 HRS): SARS-CoV-2, NAA: NOT DETECTED

## 2019-01-18 ENCOUNTER — Other Ambulatory Visit: Payer: Self-pay

## 2019-01-18 ENCOUNTER — Ambulatory Visit (HOSPITAL_COMMUNITY)
Admission: RE | Admit: 2019-01-18 | Discharge: 2019-01-18 | Disposition: A | Discharge status: Home / Self Care | Payer: 59 | Attending: Surgery | Admitting: Surgery

## 2019-01-18 ENCOUNTER — Encounter (HOSPITAL_COMMUNITY)
Admission: RE | Disposition: A | Discharge status: Home / Self Care | Payer: Self-pay | Source: Home / Self Care | Attending: Surgery

## 2019-01-18 ENCOUNTER — Encounter (HOSPITAL_COMMUNITY): Payer: Self-pay | Admitting: Surgery

## 2019-01-18 DIAGNOSIS — Z87891 Personal history of nicotine dependence: Secondary | ICD-10-CM | POA: Insufficient documentation

## 2019-01-18 DIAGNOSIS — I110 Hypertensive heart disease with heart failure: Secondary | ICD-10-CM | POA: Diagnosis not present

## 2019-01-18 DIAGNOSIS — Z7984 Long term (current) use of oral hypoglycemic drugs: Secondary | ICD-10-CM | POA: Insufficient documentation

## 2019-01-18 DIAGNOSIS — Z7982 Long term (current) use of aspirin: Secondary | ICD-10-CM | POA: Diagnosis not present

## 2019-01-18 DIAGNOSIS — Z79899 Other long term (current) drug therapy: Secondary | ICD-10-CM | POA: Diagnosis not present

## 2019-01-18 DIAGNOSIS — L97519 Non-pressure chronic ulcer of other part of right foot with unspecified severity: Secondary | ICD-10-CM | POA: Insufficient documentation

## 2019-01-18 DIAGNOSIS — I509 Heart failure, unspecified: Secondary | ICD-10-CM | POA: Insufficient documentation

## 2019-01-18 DIAGNOSIS — I70235 Atherosclerosis of native arteries of right leg with ulceration of other part of foot: Secondary | ICD-10-CM | POA: Insufficient documentation

## 2019-01-18 DIAGNOSIS — I70238 Atherosclerosis of native arteries of right leg with ulceration of other part of lower right leg: Secondary | ICD-10-CM | POA: Diagnosis not present

## 2019-01-18 DIAGNOSIS — Z833 Family history of diabetes mellitus: Secondary | ICD-10-CM | POA: Insufficient documentation

## 2019-01-18 DIAGNOSIS — E11621 Type 2 diabetes mellitus with foot ulcer: Secondary | ICD-10-CM | POA: Diagnosis not present

## 2019-01-18 DIAGNOSIS — I771 Stricture of artery: Secondary | ICD-10-CM | POA: Insufficient documentation

## 2019-01-18 DIAGNOSIS — E1151 Type 2 diabetes mellitus with diabetic peripheral angiopathy without gangrene: Secondary | ICD-10-CM | POA: Insufficient documentation

## 2019-01-18 HISTORY — PX: PERIPHERAL VASCULAR BALLOON ANGIOPLASTY: CATH118281

## 2019-01-18 HISTORY — PX: ABDOMINAL AORTOGRAM W/LOWER EXTREMITY: CATH118223

## 2019-01-18 LAB — GLUCOSE, CAPILLARY: Glucose-Capillary: 139 mg/dL — ABNORMAL HIGH (ref 70–99)

## 2019-01-18 LAB — POCT I-STAT, CHEM 8
BUN: 11 mg/dL (ref 6–20)
Calcium, Ion: 1.24 mmol/L (ref 1.15–1.40)
Chloride: 107 mmol/L (ref 98–111)
Creatinine, Ser: 1.1 mg/dL (ref 0.61–1.24)
Glucose, Bld: 171 mg/dL — ABNORMAL HIGH (ref 70–99)
HCT: 30 % — ABNORMAL LOW (ref 39.0–52.0)
Hemoglobin: 10.2 g/dL — ABNORMAL LOW (ref 13.0–17.0)
Potassium: 3.8 mmol/L (ref 3.5–5.1)
Sodium: 143 mmol/L (ref 135–145)
TCO2: 24 mmol/L (ref 22–32)

## 2019-01-18 LAB — POCT ACTIVATED CLOTTING TIME: Activated Clotting Time: 263 seconds

## 2019-01-18 SURGERY — ABDOMINAL AORTOGRAM W/LOWER EXTREMITY
Anesthesia: LOCAL | Laterality: Right

## 2019-01-18 MED ORDER — SODIUM CHLORIDE 0.9 % WEIGHT BASED INFUSION: 1.0000 mL/kg/h | INTRAVENOUS | Status: DC

## 2019-01-18 MED ORDER — HYDRALAZINE HCL 20 MG/ML IJ SOLN: 5.0000 mg | INTRAMUSCULAR | Status: DC | PRN

## 2019-01-18 MED ORDER — LABETALOL HCL 5 MG/ML IV SOLN: 10.0000 mg | INTRAVENOUS | Status: DC | PRN

## 2019-01-18 MED ORDER — CLOPIDOGREL BISULFATE 75 MG PO TABS: 75.0000 mg | ORAL_TABLET | Freq: Every day | ORAL | Status: DC

## 2019-01-18 MED ORDER — FENTANYL CITRATE (PF) 100 MCG/2ML IJ SOLN
INTRAMUSCULAR | Status: AC
  Filled 2019-01-18: qty 2

## 2019-01-18 MED ORDER — CLOPIDOGREL BISULFATE 300 MG PO TABS
ORAL_TABLET | ORAL | Status: DC | PRN
  Administered 2019-01-18: 300 mg via ORAL

## 2019-01-18 MED ORDER — SODIUM CHLORIDE 0.9 % IV SOLN
INTRAVENOUS | Status: DC
  Administered 2019-01-18: 07:00:00 via INTRAVENOUS

## 2019-01-18 MED ORDER — SODIUM CHLORIDE 0.9% FLUSH: 3.0000 mL | INTRAVENOUS | Status: DC | PRN

## 2019-01-18 MED ORDER — CLOPIDOGREL BISULFATE 75 MG PO TABS: 300.0000 mg | ORAL_TABLET | Freq: Once | ORAL | Status: DC

## 2019-01-18 MED ORDER — CLOPIDOGREL BISULFATE 300 MG PO TABS
ORAL_TABLET | ORAL | Status: AC
  Filled 2019-01-18: qty 1

## 2019-01-18 MED ORDER — LIDOCAINE HCL (PF) 1 % IJ SOLN
INTRAMUSCULAR | Status: AC
  Filled 2019-01-18: qty 30

## 2019-01-18 MED ORDER — LIDOCAINE HCL (PF) 1 % IJ SOLN
INTRAMUSCULAR | Status: DC | PRN
  Administered 2019-01-18: 15 mL

## 2019-01-18 MED ORDER — HEPARIN (PORCINE) IN NACL 1000-0.9 UT/500ML-% IV SOLN
INTRAVENOUS | Status: AC
  Filled 2019-01-18: qty 1000

## 2019-01-18 MED ORDER — MIDAZOLAM HCL 2 MG/2ML IJ SOLN
INTRAMUSCULAR | Status: AC
  Filled 2019-01-18: qty 2

## 2019-01-18 MED ORDER — HEPARIN (PORCINE) IN NACL 1000-0.9 UT/500ML-% IV SOLN
INTRAVENOUS | Status: DC | PRN
  Administered 2019-01-18 (×2): 500 mL

## 2019-01-18 MED ORDER — SODIUM CHLORIDE 0.9% FLUSH: 3.0000 mL | Freq: Two times a day (BID) | INTRAVENOUS | Status: DC

## 2019-01-18 MED ORDER — MIDAZOLAM HCL 2 MG/2ML IJ SOLN
INTRAMUSCULAR | Status: DC | PRN
  Administered 2019-01-18: 2 mg via INTRAVENOUS
  Administered 2019-01-18: 1 mg via INTRAVENOUS

## 2019-01-18 MED ORDER — ONDANSETRON HCL 4 MG/2ML IJ SOLN: 4.0000 mg | Freq: Four times a day (QID) | INTRAMUSCULAR | Status: DC | PRN

## 2019-01-18 MED ORDER — FENTANYL CITRATE (PF) 100 MCG/2ML IJ SOLN
INTRAMUSCULAR | Status: DC | PRN
  Administered 2019-01-18: 50 ug via INTRAVENOUS
  Administered 2019-01-18: 25 ug via INTRAVENOUS

## 2019-01-18 MED ORDER — OXYCODONE HCL 5 MG PO TABS: 5.0000 mg | ORAL_TABLET | ORAL | Status: DC | PRN

## 2019-01-18 MED ORDER — ACETAMINOPHEN 325 MG PO TABS: 650.0000 mg | ORAL_TABLET | ORAL | Status: DC | PRN

## 2019-01-18 MED ORDER — HEPARIN SODIUM (PORCINE) 1000 UNIT/ML IJ SOLN
INTRAMUSCULAR | Status: DC | PRN
  Administered 2019-01-18: 10000 [IU] via INTRAVENOUS

## 2019-01-18 MED ORDER — NITROGLYCERIN 1 MG/10 ML FOR IR/CATH LAB
INTRA_ARTERIAL | Status: AC
  Filled 2019-01-18: qty 10

## 2019-01-18 MED ORDER — MORPHINE SULFATE (PF) 2 MG/ML IV SOLN: 2.0000 mg | INTRAVENOUS | Status: DC | PRN

## 2019-01-18 MED ORDER — HEPARIN SODIUM (PORCINE) 1000 UNIT/ML IJ SOLN
INTRAMUSCULAR | Status: AC
  Filled 2019-01-18: qty 1

## 2019-01-18 MED ORDER — NITROGLYCERIN 1 MG/10 ML FOR IR/CATH LAB
INTRA_ARTERIAL | Status: DC | PRN
  Administered 2019-01-18 (×2): 300 ug via INTRA_ARTERIAL
  Administered 2019-01-18: 400 ug via INTRA_ARTERIAL

## 2019-01-18 MED ORDER — CLOPIDOGREL BISULFATE 75 MG PO TABS: 75.0000 mg | ORAL_TABLET | Freq: Every day | ORAL | 11 refills | Status: DC

## 2019-01-18 MED ORDER — IODIXANOL 320 MG/ML IV SOLN
INTRAVENOUS | Status: DC | PRN
  Administered 2019-01-18: 10:00:00 205 mL via INTRA_ARTERIAL

## 2019-01-18 MED ORDER — SODIUM CHLORIDE 0.9 % IV SOLN: 250.0000 mL | INTRAVENOUS | Status: DC | PRN

## 2019-01-18 SURGICAL SUPPLY — 24 items
BAG SNAP BAND KOVER 36X36 (MISCELLANEOUS) ×1 IMPLANT
BALLN STERLING OTW 3X100X150 (BALLOONS) ×3
BALLN STERLING OTW 3X40X150 (BALLOONS) ×3
BALLOON STERLING OTW 3X100X150 (BALLOONS) IMPLANT
BALLOON STERLING OTW 3X40X150 (BALLOONS) IMPLANT
CATH ANGIO 5F BER2 100CM (CATHETERS) ×1 IMPLANT
CATH OMNI FLUSH 5F 65CM (CATHETERS) ×1 IMPLANT
CLOSURE MYNX CONTROL 6F/7F (Vascular Products) ×1 IMPLANT
COVER DOME SNAP 22 D (MISCELLANEOUS) ×2 IMPLANT
KIT ENCORE 26 ADVANTAGE (KITS) ×1 IMPLANT
KIT MICROPUNCTURE NIT STIFF (SHEATH) ×1 IMPLANT
KIT PV (KITS) ×3 IMPLANT
SHEATH DESTINATION 7FR 90 (SHEATH) ×1 IMPLANT
SHEATH PINNACLE 5F 10CM (SHEATH) ×1 IMPLANT
SHEATH PINNACLE 7F 10CM (SHEATH) ×1 IMPLANT
SHEATH PROBE COVER 6X72 (BAG) ×1 IMPLANT
SHIELD RADPAD SCOOP 12X17 (MISCELLANEOUS) ×1 IMPLANT
SYR MEDRAD MARK V 150ML (SYRINGE) ×1 IMPLANT
TRANSDUCER W/STOPCOCK (MISCELLANEOUS) ×3 IMPLANT
TRAY PV CATH (CUSTOM PROCEDURE TRAY) ×3 IMPLANT
WIRE BENTSON .035X145CM (WIRE) ×1 IMPLANT
WIRE G V18X300CM (WIRE) ×3 IMPLANT
WIRE ROSEN-J .035X260CM (WIRE) ×1 IMPLANT
WIRE SPARTACORE .014X300CM (WIRE) ×1 IMPLANT

## 2019-01-18 NOTE — Op Note (Signed)
Patient name: Christopher Roth MRN: [id number] DOB: June 03, 1970 Sex: male  01/18/2019 Pre-operative Diagnosis: Right toe ulcer Post-operative diagnosis:  Same Surgeon:  Annamarie Major Procedure Performed:  1.  Ultrasound-guided access, left femoral artery  2.  Abdominal aortogram  3.  Bilateral lower extremity runoff  4.  Angioplasty, right anterior tibial artery  5.  Angioplasty, right peroneal artery  6.  Intra-arterial visualization of nitroglycerin  7.  Conscious sedation (107 minutes)  8.  Closure device (Mynx)   Indications: This is a 48 year old diabetic with a nonhealing right great toe wound.  Ultrasound indicated peripheral vascular disease.  He is here today for further evaluation and possible intervention.  Procedure:  The patient was identified in the holding area and taken to room 8.  The patient was then placed supine on the table and prepped and draped in the usual sterile fashion.  A time out was called.  Conscious sedation was administered with the use of IV fentanyl and Versed under continuous physician and nurse monitoring.  Heart rate, blood pressure, and oxygen saturation were continuously monitored.  Total sedation time was 107 minutes.  Ultrasound was used to evaluate the left common femoral artery.  It was patent .  A digital ultrasound image was acquired.  A micropuncture needle was used to access the left common femoral artery under ultrasound guidance.  An 018 wire was advanced without resistance and a micropuncture sheath was placed.  The 018 wire was removed and a benson wire was placed.  The micropuncture sheath was exchanged for a 5 french sheath.  An omniflush catheter was advanced over the wire to the level of L-1.  An abdominal angiogram was obtained.  The catheter was then pulled down to the aortic bifurcation and bilateral runoff was obtained.  I then crossed the aortic bifurcation with a Omni Flush catheter and a Bentson wire and took additional images with the  catheter in the right external iliac artery.  Findings:   Aortogram: No significant renal artery stenosis.  Infrarenal abdominal aorta is widely patent.  Bilateral common and external iliac arteries are widely patent  Right Lower Extremity: The right common femoral profundofemoral and superficial femoral artery are widely patent.  There is mild disease within the adductor canal.  The popliteal artery is widely patent.  The anterior tibial artery is occluded at its origin and reconstitutes approximately 6 cm beyond its origin.  There is near occlusive lesion within the origin of the peroneal artery at its origin.  The posterior tibial artery is occluded.  Left Lower Extremity: Left common femoral profundofemoral and superficial femoral artery are widely patent. The popliteal artery is widely patent.  There is two-vessel runoff via the peroneal and anterior tibial artery.  Intervention: After the above images were acquired the decision was made to proceed with intervention.  Over a Rosen wire, a 7 French 9 cm sheath was advanced into the right popliteal artery.  The patient was fully heparinized.  I used a Berenstein 2 catheter and a V-18 wire to engage the anterior tibial artery and ultimately cross the occlusion.  A 3 x 100 Sterling balloon was then inserted over the wire after removing the Berenstein catheter.  Selective images of the anterior tibial artery were performed to confirm successful crossing of the lesion.  I then performed balloon angioplasty of the anterior tibial artery.  The artery was basically treated from the lower calf up across the origin.  Completion imaging showed resolution of the stenosis.  I left a wire in the anterior tibial artery and then got a second access through the sheath.  I used a V 18 wire and a 3 x 100 Sterling balloon to navigate through the origin stenosis of the peroneal artery.  Once this was performed I performed balloon angioplasty of the peroneal artery.   Completion imaging was then performed which showed resolution of the stenosis within the peroneal artery.  Unfortunately in the distal anterior tibial artery there was a abrupt occlusion which I felt was likely spasm and so intra-arterial nitroglycerin was given.  A total of 1000 mcg of nitroglycerin was given.  Follow-up imaging after nitroglycerin persistent occlusion and so I went back down with the balloon and repeated balloon angioplasty here.  There did appear to be a small calcified plaque which was creating a stenosis.  I went back down with a 3 x 40 balloon and treated this area again with improved results.  At this point on completion imaging there was two-vessel runoff via the anterior tibial and peroneal artery.  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  Resolution of the stenosis was achieved.  There was a atherosclerotic plaque in the distal anterior tibial artery above the ankle that required additional treatment.  This area will need to be monitored on subsequent follow-up ultrasounds.  #2  Greater than 80% stenosis within the first 8 cm of the origin of the peroneal artery.  This was treated with 3 mm balloon angioplasty with resolution of the stenosis.  #3  No significant inflow or outflow stenosis bilaterally  #4  The patient will be started on Plavix today for a minimum of 3 months    V. Annamarie Major, M.D., [facility] [place] Office: [phone] Pager:  [phone]

## 2019-01-18 NOTE — Discharge Instructions (Signed)
Moderate Conscious Sedation, Adult, Care After These instructions provide you with information about caring for yourself after your procedure. Your health care provider may also give you more specific instructions. Your treatment has been planned according to current medical practices, but problems sometimes occur. Call your health care provider if you have any problems or questions after your procedure. What can I expect after the procedure? After your procedure, it is common:  To feel sleepy for several hours.  To feel clumsy and have poor balance for several hours.  To have poor judgment for several hours.  To vomit if you eat too soon. Follow these instructions at home: For at least 24 hours after the procedure:   Do not: ? Participate in activities where you could fall or become injured. ? Drive. ? Use heavy machinery. ? Drink alcohol. ? Take sleeping pills or medicines that cause drowsiness. ? Make important decisions or sign legal documents. ? Take care of children on your own.  Rest. Eating and drinking  Follow the diet recommended by your health care provider.  If you vomit: ? Drink water, juice, or soup when you can drink without vomiting. ? Make sure you have little or no nausea before eating solid foods. General instructions  Have a responsible adult stay with you until you are awake and alert.  Take over-the-counter and prescription medicines only as told by your health care provider.  If you smoke, do not smoke without supervision.  Keep all follow-up visits as told by your health care provider. This is important. Contact a health care provider if:  You keep feeling nauseous or you keep vomiting.  You feel light-headed.  You develop a rash.  You have a fever. Get help right away if:  You have trouble breathing. This information is not intended to replace advice given to you by your health care provider. Make sure you discuss any questions you  have with your health care provider. Document Released: 02/09/2013 Document Revised: 04/03/2017 Document Reviewed: 08/11/2015 Elsevier Patient Education  2020 [employer] After This sheet gives you information about how to care for yourself after your procedure. Your doctor may also give you more specific instructions. If you have problems or questions, contact your doctor. Follow these instructions at home: Insertion site care  Follow instructions from your doctor about how to take care of your long, thin tube (catheter) insertion area. Make sure you: ? Wash your hands with soap and water before you change your bandage (dressing). If you cannot use soap and water, use hand sanitizer. ? Change your bandage as told by your doctor. ? Leave stitches (sutures), skin glue, or skin tape (adhesive) strips in place. They may need to stay in place for 2 weeks or longer. If tape strips get loose and curl up, you may trim the loose edges. Do not remove tape strips completely unless your doctor says it is okay.  Do not take baths, swim, or use a hot tub until your doctor says it is okay.  You may shower 24-48 hours after the procedure or as told by your doctor. ? Gently wash the area with plain soap and water. ? Pat the area dry with a clean towel. ? Do not rub the area. This may cause bleeding.  Do not apply powder or lotion to the area. Keep the area clean and dry.  Check your insertion area every day for signs of infection. Check for: ? More redness, swelling, or pain. ?  Fluid or blood. ? Warmth. ? Pus or a bad smell. Activity  Rest as told by your doctor, usually for 1-2 days.  Do not lift anything that is heavier than 10 lbs. (4.5 kg) or as told by your doctor.  Do not drive for 24 hours if you were given a medicine to help you relax (sedative).  Do not drive or use heavy machinery while taking prescription pain medicine. General instructions   Go back to your  normal activities as told by your doctor, usually in about a week. Ask your doctor what activities are safe for you.  If the insertion area starts to bleed, lie flat and put pressure on the area. If the bleeding does not stop, get help right away. This is an emergency.  Drink enough fluid to keep your pee (urine) clear or pale yellow.  Take over-the-counter and prescription medicines only as told by your doctor.  Keep all follow-up visits as told by your doctor. This is important. Contact a doctor if:  You have a fever.  You have chills.  You have more redness, swelling, or pain around your insertion area.  You have fluid or blood coming from your insertion area.  The insertion area feels warm to the touch.  You have pus or a bad smell coming from your insertion area.  You have more bruising around the insertion area.  Blood collects in the tissue around the insertion area (hematoma) that may be painful to the touch. Get help right away if:  You have a lot of pain in the insertion area.  The insertion area swells very fast.  The insertion area is bleeding, and the bleeding does not stop after holding steady pressure on the area.  The area near or just beyond the insertion area becomes pale, cool, tingly, or numb. These symptoms may be an emergency. Do not wait to see if the symptoms will go away. Get medical help right away. Call your local emergency services (911 in the [place] Do not drive yourself to the hospital. Summary  After the procedure, it is common to have bruising and tenderness at the long, thin tube insertion area.  After the procedure, it is important to rest and drink plenty of fluids.  Do not take baths, swim, or use a hot tub until your doctor says it is okay to do so. You may shower 24-48 hours after the procedure or as told by your doctor.  If the insertion area starts to bleed, lie flat and put pressure on the area. If the bleeding does not stop, get  help right away. This is an emergency. This information is not intended to replace advice given to you by your health care provider. Make sure you discuss any questions you have with your health care provider. Document Released: 07/18/2008 Document Revised: 04/03/2017 Document Reviewed: 04/15/2016 Elsevier Patient Education  2020 [employer].

## 2019-01-18 NOTE — Interval H&P Note (Signed)
History and Physical Interval Note:  01/18/2019 7:38 AM  Christopher Roth  has presented today for surgery, with the diagnosis of PAD.  The various methods of treatment have been discussed with the patient and family. After consideration of risks, benefits and other options for treatment, the patient has consented to  Procedure(s): ABDOMINAL AORTOGRAM W/LOWER EXTREMITY (Bilateral) as a surgical intervention.  The patient's history has been reviewed, patient examined, no change in status, stable for surgery.  I have reviewed the patient's chart and labs.  Questions were answered to the patient's satisfaction.     Annamarie Major

## 2019-01-19 ENCOUNTER — Encounter (HOSPITAL_COMMUNITY): Payer: Self-pay | Admitting: Surgery

## 2019-01-28 ENCOUNTER — Telehealth: Payer: Self-pay | Admitting: *Deleted

## 2019-01-28 ENCOUNTER — Encounter: Payer: Self-pay | Admitting: Podiatry

## 2019-01-28 ENCOUNTER — Other Ambulatory Visit: Payer: Self-pay

## 2019-01-28 ENCOUNTER — Ambulatory Visit (INDEPENDENT_AMBULATORY_CARE_PROVIDER_SITE_OTHER): Payer: PRIVATE HEALTH INSURANCE

## 2019-01-28 ENCOUNTER — Ambulatory Visit: Payer: PRIVATE HEALTH INSURANCE | Admitting: Podiatry

## 2019-01-28 VITALS — BP 128/76 | HR 72 | Resp 16

## 2019-01-28 DIAGNOSIS — E119 Type 2 diabetes mellitus without complications: Secondary | ICD-10-CM | POA: Insufficient documentation

## 2019-01-28 DIAGNOSIS — L97511 Non-pressure chronic ulcer of other part of right foot limited to breakdown of skin: Secondary | ICD-10-CM

## 2019-01-28 DIAGNOSIS — M869 Osteomyelitis, unspecified: Secondary | ICD-10-CM

## 2019-01-28 DIAGNOSIS — M86171 Other acute osteomyelitis, right ankle and foot: Secondary | ICD-10-CM

## 2019-01-28 MED ORDER — SANTYL 250 UNIT/GM EX OINT: 1.0000 "application " | TOPICAL_OINTMENT | Freq: Every day | CUTANEOUS | 2 refills | Status: DC

## 2019-01-28 NOTE — Progress Notes (Signed)
Subjective:   Patient ID: Christopher Roth, male   DOB: 48 y.o.   MRN: [id number]   HPI 48 year old male with history of diabetes (current A1c 7.9), CHF with defibrillator presents to the office today for second opinion on wounds of the right foot.  He has been under the care of Dr. Raelyn Ensign for the wounds.  Initially started off as a blister along the medial first MPJ.  He states that from a dressing that he applied developed a wound on the outside aspect plantar fifth metatarsal head.  He recently just started oxacillin, ciprofloxacin last week.  He also has followed up with vascular surgery.  He underwent angiogram on September 15.  He has been keeping Silvadene on the wound.  He has been out of work for last 2 months because of the wound.  Currently denies any drainage or pus coming from the wound.  No red streaking.  Review of Systems  All other systems reviewed and are negative.   Past Medical History:  Diagnosis Date  . Acute systolic congestive heart failure ([place]) 04/22/2016  . Diabetes mellitus without complication ([place])   . Hypertension   . Tobacco use disorder 11/21/2014    Past Surgical History:  Procedure Laterality Date  . ABDOMINAL AORTOGRAM W/LOWER EXTREMITY Bilateral 01/18/2019   Procedure: ABDOMINAL AORTOGRAM W/LOWER EXTREMITY;  Surgeon: Serafina Mitchell, MD;  Location: [facility];  Service: Cardiovascular;  Laterality: Bilateral;  . CARDIAC CATHETERIZATION N/A 04/23/2016   Procedure: Right/Left Heart Cath and Coronary Angiography;  Surgeon: Belva Crome, MD;  Location: [facility];  Service: Cardiovascular;  Laterality: N/A;  . ICD IMPLANT N/A 01/27/2018   Procedure: ICD IMPLANT;  Surgeon: Deboraha Sprang, MD;  Location: [facility];  Service: Cardiovascular;  Laterality: N/A;  . INCISION AND DRAINAGE PERIRECTAL ABSCESS Right 11/17/2014   Procedure: debridement of right perianal and groin wound;  Surgeon: Johnathan Hausen, MD;  Location: [facility];  Service:  General;  Laterality: Right;  . PERIPHERAL VASCULAR BALLOON ANGIOPLASTY Right 01/18/2019   Procedure: PERIPHERAL VASCULAR BALLOON ANGIOPLASTY;  Surgeon: Serafina Mitchell, MD;  Location: [facility];  Service: Cardiovascular;  Laterality: Right;  posterior tibial, peroneal     Current Outpatient Medications:  .  AMOXICILLIN PO, Take by mouth., Disp: , Rfl:  .  Ciprofloxacin (CIPRO PO), Take by mouth., Disp: , Rfl:  .  albuterol (PROVENTIL HFA;VENTOLIN HFA) 108 (90 Base) MCG/ACT inhaler, Inhale 2 puffs into the lungs every 6 (six) hours as needed for wheezing or shortness of breath., Disp: , Rfl:  .  Alcohol Swabs PADS, 1 Package by Does not apply route as needed (with blood glucose checks)., Disp: 1 each, Rfl: 0 .  aspirin EC 81 MG tablet, Take 81 mg by mouth daily., Disp: , Rfl:  .  atorvastatin (LIPITOR) 80 MG tablet, TAKE 1 TABLET BY MOUTH DAILY AT 6 PM (Patient taking differently: Take 80 mg by mouth every evening. ), Disp: 30 tablet, Rfl: 0 .  carvedilol (COREG) 6.25 MG tablet, Take 1 tablet (6.25 mg total) by mouth 2 (two) times daily with a meal., Disp: 180 tablet, Rfl: 3 .  clopidogrel (PLAVIX) 75 MG tablet, Take 1 tablet (75 mg total) by mouth daily., Disp: 30 tablet, Rfl: 11 .  Coenzyme Q10 (COQ10) 100 MG CAPS, Take 100 mg by mouth daily., Disp: , Rfl:  .  dapagliflozin propanediol (FARXIGA) 5 MG TABS tablet, Take 5 mg by mouth daily. , Disp: ,  Rfl:  .  furosemide (LASIX) 20 MG tablet, Take 1 tablet (20 mg total) by mouth as needed for fluid. (Patient taking differently: Take 20 mg by mouth daily as needed for fluid. ), Disp: 30 tablet, Rfl: 11 .  GARLIC PO, Take 1 tablet by mouth daily., Disp: , Rfl:  .  glucose blood (CHOICE DM FORA G20 TEST STRIPS) test strip, Use as instructed, Disp: 100 each, Rfl: 11 .  glucose monitoring kit (FREESTYLE) monitoring kit, 1 each by Does not apply route as needed for other., Disp: 1 each, Rfl: 0 .  isosorbide-hydrALAZINE (BIDIL) 20-37.5 MG  tablet, Take 2 tablets by mouth 3 (three) times daily., Disp: 180 tablet, Rfl: 0 .  Lancets (FREESTYLE) lancets, Use as instructed, Disp: 100 each, Rfl: 11 .  metFORMIN (GLUCOPHAGE) 1000 MG tablet, Take 1,000 mg by mouth 2 (two) times daily with a meal., Disp: , Rfl:  .  povidone-Iodine (BETADINE) 5 % SOLN topical solution, Apply 1 application topically 3 (three) times daily as needed for wound care (foot wound)., Disp: , Rfl:  .  sacubitril-valsartan (ENTRESTO) 97-103 MG, Take 1 tablet by mouth 2 (two) times daily., Disp: 180 tablet, Rfl: 2 .  sitaGLIPtin (JANUVIA) 100 MG tablet, Take 100 mg by mouth every evening. , Disp: , Rfl:  .  sodium chloride irrigation 0.9 % irrigation, Irrigate with 1 application as directed 4 (four) times daily as needed (wound care)., Disp: , Rfl:  .  spironolactone (ALDACTONE) 25 MG tablet, Take 1 tablet by mouth once daily (Patient taking differently: Take 25 mg by mouth every evening. ), Disp: 30 tablet, Rfl: 0 .  vitamin C (ASCORBIC ACID) 500 MG tablet, Take 500 mg by mouth daily., Disp: , Rfl:   Allergies  Allergen Reactions  . Nickel Rash    Angio 01/18/2019- Dr. 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  Resolution of the stenosis was achieved.  There was a atherosclerotic plaque in the distal anterior tibial artery above the ankle that required additional treatment.  This area will need to be monitored on subsequent follow-up ultrasounds.               #2  Greater than 80% stenosis within the first 8 cm of the origin of the peroneal artery.  This was treated with 3 mm balloon angioplasty with resolution of the stenosis.               #3  No significant inflow or outflow stenosis bilaterally               #4  The patient will be started on Plavix today for a minimum of 3 months    Objective:  Physical Exam  General: AAO x3, NAD  Dermatological: On the medial  aspect of the first MPJ is an ulceration measuring 1 x 1 cm and on the lateral fifth metatarsal is 2 x 1 cm.  Fibrotic, necrotic tissue is present along the wound.  There is no probing to bone, undermining or tunneling.  No surrounding erythema, ascending cellulitis.  No fluctuation or crepitation.  There is mild swelling to the foot but no significant erythema or warmth of the foot.  Vascular: Pulses decreased on the right foot.  Palpable on the left foot.  Neruologic: Sensation decreased Semmes-Weinstein monofilament.  Musculoskeletal: No gross boney pedal  deformities bilateral. No pain, crepitus, or limitation noted with foot and ankle range of motion bilateral. Muscular strength 5/5 in all groups tested bilateral.     Assessment:   Chronic ulcerations, osteomyelitis right foot, PAD     Plan:  -Treatment options discussed including all alternatives, risks, and complications -Etiology of symptoms were discussed -X-rays were obtained and reviewed with the patient.  Cortical changes present fifth metatarsal head laterally consistent with osteomyelitis.  Also swelling of the first MPJ concerning for possible osteomyelitis of this area. -Ready to switch patient Santyl to the wounds daily. -Continue current antibiotic regimen.  Will refer to infectious disease as well. -MRI foot. -Offloading shoe. -Previously underwent angiogram.  Follow-up with vascular surgery as scheduled. -Ordered CBC, BMP, CRP, A1c, ESR. -Discussed with him risk of amputation. -For now we will continue to hold off on return to work given the infection.  He will bring paperwork in the office for [place] to complete. -Monitor for any clinical signs or symptoms of infection and directed to call the office immediately should any occur or go to the ER.  Trula Slade DPM

## 2019-01-28 NOTE — Telephone Encounter (Signed)
Orders faxed to [facility], given to L. Cox, CMA for pre-cert. [facility], demographic to be faxed once 01/28/2019 clinicals are available.

## 2019-01-28 NOTE — Telephone Encounter (Signed)
-----   Message from Trula Slade, DPM sent at 01/28/2019  9:33 AM EDT ----- Can you please order CT scan with contrast for osteomyelitis right foot? Also can you please put in for an infectious disease consult? Thanks.   He has a defibrillator so cannot get MRI I would assume.    Thanks.

## 2019-01-29 LAB — BASIC METABOLIC PANEL
BUN/Creatinine Ratio: 17 (calc) (ref 6–22)
BUN: 24 mg/dL (ref 7–25)
CO2: 21 mmol/L (ref 20–32)
Calcium: 9.7 mg/dL (ref 8.6–10.3)
Chloride: 109 mmol/L (ref 98–110)
Creat: 1.4 mg/dL — ABNORMAL HIGH (ref 0.60–1.35)
Glucose, Bld: 138 mg/dL — ABNORMAL HIGH (ref 65–99)
Potassium: 4.7 mmol/L (ref 3.5–5.3)
Sodium: 141 mmol/L (ref 135–146)

## 2019-01-29 LAB — CBC WITH DIFFERENTIAL/PLATELET
Absolute Monocytes: 596 cells/uL (ref 200–950)
Basophils Absolute: 42 cells/uL (ref 0–200)
Basophils Relative: 0.5 %
Eosinophils Absolute: 143 cells/uL (ref 15–500)
Eosinophils Relative: 1.7 %
HCT: 34.3 % — ABNORMAL LOW (ref 38.5–50.0)
Hemoglobin: 11 g/dL — ABNORMAL LOW (ref 13.2–17.1)
Lymphs Abs: 2486 cells/uL (ref 850–3900)
MCH: 27.8 pg (ref 27.0–33.0)
MCHC: 32.1 g/dL (ref 32.0–36.0)
MCV: 86.6 fL (ref 80.0–100.0)
MPV: 11.2 fL (ref 7.5–12.5)
Monocytes Relative: 7.1 %
Neutro Abs: 5132 cells/uL (ref 1500–7800)
Neutrophils Relative %: 61.1 %
Platelets: 430 10*3/uL — ABNORMAL HIGH (ref 140–400)
RBC: 3.96 10*6/uL — ABNORMAL LOW (ref 4.20–5.80)
RDW: 14.8 % (ref 11.0–15.0)
Total Lymphocyte: 29.6 %
WBC: 8.4 10*3/uL (ref 3.8–10.8)

## 2019-01-29 LAB — C-REACTIVE PROTEIN: CRP: 3.2 mg/L (ref <8.0)

## 2019-01-29 LAB — HEMOGLOBIN A1C
Hgb A1c MFr Bld: 7.7 % of total Hgb — ABNORMAL HIGH (ref <5.7)
Mean Plasma Glucose: 174 (calc)
eAG (mmol/L): 9.7 (calc)

## 2019-01-29 LAB — SEDIMENTATION RATE: Sed Rate: 45 mm/h — ABNORMAL HIGH (ref 0–15)

## 2019-01-31 ENCOUNTER — Encounter: Payer: Self-pay | Admitting: Infectious Disease

## 2019-01-31 ENCOUNTER — Telehealth: Payer: Self-pay | Admitting: *Deleted

## 2019-01-31 ENCOUNTER — Ambulatory Visit (INDEPENDENT_AMBULATORY_CARE_PROVIDER_SITE_OTHER): Payer: 59 | Admitting: Infectious Disease

## 2019-01-31 ENCOUNTER — Other Ambulatory Visit: Payer: Self-pay

## 2019-01-31 VITALS — BP 124/85 | HR 73 | Temp 98.3°F

## 2019-01-31 DIAGNOSIS — F17218 Nicotine dependence, cigarettes, with other nicotine-induced disorders: Secondary | ICD-10-CM

## 2019-01-31 DIAGNOSIS — F17200 Nicotine dependence, unspecified, uncomplicated: Secondary | ICD-10-CM

## 2019-01-31 DIAGNOSIS — Z23 Encounter for immunization: Secondary | ICD-10-CM

## 2019-01-31 DIAGNOSIS — E11621 Type 2 diabetes mellitus with foot ulcer: Secondary | ICD-10-CM

## 2019-01-31 DIAGNOSIS — Z95810 Presence of automatic (implantable) cardiac defibrillator: Secondary | ICD-10-CM | POA: Diagnosis not present

## 2019-01-31 DIAGNOSIS — I5020 Unspecified systolic (congestive) heart failure: Secondary | ICD-10-CM

## 2019-01-31 DIAGNOSIS — L97508 Non-pressure chronic ulcer of other part of unspecified foot with other specified severity: Secondary | ICD-10-CM

## 2019-01-31 DIAGNOSIS — I255 Ischemic cardiomyopathy: Secondary | ICD-10-CM | POA: Diagnosis not present

## 2019-01-31 DIAGNOSIS — E13621 Other specified diabetes mellitus with foot ulcer: Secondary | ICD-10-CM

## 2019-01-31 DIAGNOSIS — L97509 Non-pressure chronic ulcer of other part of unspecified foot with unspecified severity: Secondary | ICD-10-CM

## 2019-01-31 DIAGNOSIS — E1165 Type 2 diabetes mellitus with hyperglycemia: Secondary | ICD-10-CM

## 2019-01-31 HISTORY — DX: Non-pressure chronic ulcer of other part of unspecified foot with unspecified severity: L97.509

## 2019-01-31 HISTORY — DX: Type 2 diabetes mellitus with foot ulcer: E11.621

## 2019-01-31 NOTE — Telephone Encounter (Signed)
ESR increased however CRP and WBC normal. Creatine is slightly high  Lets see what the CT scan shows as far as infection. I saw he was seen today by Dr. Drucilla Schmidt.

## 2019-01-31 NOTE — Telephone Encounter (Signed)
Called [facility] and spoke with Ronna Polio and Oley Balm B and got the prior authorization number #[id number] to have the CT of the right foot and is valid from 01-31-19 to 07-30-2019. Lattie Haw

## 2019-01-31 NOTE — Progress Notes (Signed)
Reason for infectious disease consult diabetic foot ulcers with likely osteomyelitis  Requesting Physician Dr. Jacqualyn Posey  Subjective:    Patient ID: Christopher Roth, male    DOB: [birth year], 48 y.o.   MRN: [id number]  HPI   Christopher Roth is a 48 year old African-American man with multiple medical problems including systolic congestive heart failure with ICD in place diabetes mellitus, former smoker who has had problems with diabetic foot ulcers and was followed at [facility] where he was seeing podiatry with Dr. Virl Cagey   He has had an ulceration on the medial aspect of his distal forefoot and also laterally.  There is now concerns on x-ray for possible osteomyelitis.  He has had debridements done at [facility] which in July yielded a polymicrobial culture including a fairly sensitive Proteus mirabilis and E. coli and enterococcus though I cannot see all the susceptibility reports this was in November 23, 2018.  This September 4 he had debridement and grew methicillin sensitive Staphylococcus aureus.  He has been treated with Augmentin and also Keflex.  He has been seen by Dr. Jacqualyn Posey tried foot center.  He has significant vascular disease and also been seen by Dr. Gwenlyn Saran and underwent an angiogram on September 15 that "showed occlusion of the anterior tibial artery at the origin which was crossed and treated with a balloon with resolution of stenosis.  He had 80% stenosis of the first 8 cm the origin of the peroneal artery which was also angioplastied with resolution he is also started on Plavix with intention having being on for the next 3 months.  He has had plain film suggesting as mentioned osteomyelitis involving the fifth meta tarsal phalangeal joint.  The first metatarsal phalangeal joint is also concerning with ulceration.  An MRI is complete scheduled.  He does have an ICD in place as well.     Past Medical History:  Diagnosis Date  . Acute systolic congestive heart failure ([place]) 04/22/2016   . Diabetes mellitus without complication ([place])   . Diabetic foot ulcer ([place]) 01/31/2019  . Hypertension   . Tobacco use disorder 11/21/2014    Past Surgical History:  Procedure Laterality Date  . ABDOMINAL AORTOGRAM W/LOWER EXTREMITY Bilateral 01/18/2019   Procedure: ABDOMINAL AORTOGRAM W/LOWER EXTREMITY;  Surgeon: Serafina Mitchell, MD;  Location: [facility] [place] [facility];  Service: Cardiovascular;  Laterality: Bilateral;  . CARDIAC CATHETERIZATION N/A 04/23/2016   Procedure: Right/Left Heart Cath and Coronary Angiography;  Surgeon: Belva Crome, MD;  Location: [facility];  Service: Cardiovascular;  Laterality: N/A;  . ICD IMPLANT N/A 01/27/2018   Procedure: ICD IMPLANT;  Surgeon: Deboraha Sprang, MD;  Location: [facility];  Service: Cardiovascular;  Laterality: N/A;  . INCISION AND DRAINAGE PERIRECTAL ABSCESS Right 11/17/2014   Procedure: debridement of right perianal and groin wound;  Surgeon: Johnathan Hausen, MD;  Location: [facility];  Service: General;  Laterality: Right;  . PERIPHERAL VASCULAR BALLOON ANGIOPLASTY Right 01/18/2019   Procedure: PERIPHERAL VASCULAR BALLOON ANGIOPLASTY;  Surgeon: Serafina Mitchell, MD;  Location: [facility];  Service: Cardiovascular;  Laterality: Right;  posterior tibial, peroneal    Family History  Problem Relation Age of Onset  . Diabetes Mellitus II Mother       Social History   Socioeconomic History  . Marital status: Married    Spouse name: Not on file  . Number of children: Not on file  . Years of education: Not on file  . Highest education level: Not  on file  Occupational History  . Not on file  Social Needs  . Financial resource strain: Not on file  . Food insecurity    Worry: Not on file    Inability: Not on file  . Transportation needs    Medical: Not on file    Non-medical: Not on file  Tobacco Use  . Smoking status: Former Smoker    Packs/day: 1.00    Years: 24.00    Pack years: 24.00    Types: Cigarettes     Quit date: 03/27/2016    Years since quitting: 2.8  . Smokeless tobacco: Never Used  Substance and Sexual Activity  . Alcohol use: Yes  . Drug use: No  . Sexual activity: Not on file  Lifestyle  . Physical activity    Days per week: Not on file    Minutes per session: Not on file  . Stress: Not on file  Relationships  . Social Herbalist on phone: Not on file    Gets together: Not on file    Attends religious service: Not on file    Active member of club or organization: Not on file    Attends meetings of clubs or organizations: Not on file    Relationship status: Not on file  Other Topics Concern  . Not on file  Social History Narrative  . Not on file    Allergies  Allergen Reactions  . Nickel Rash     Current Outpatient Medications:  .  albuterol (PROVENTIL HFA;VENTOLIN HFA) 108 (90 Base) MCG/ACT inhaler, Inhale 2 puffs into the lungs every 6 (six) hours as needed for wheezing or shortness of breath., Disp: , Rfl:  .  Alcohol Swabs PADS, 1 Package by Does not apply route as needed (with blood glucose checks)., Disp: 1 each, Rfl: 0 .  AMOXICILLIN PO, Take by mouth., Disp: , Rfl:  .  aspirin EC 81 MG tablet, Take 81 mg by mouth daily., Disp: , Rfl:  .  atorvastatin (LIPITOR) 80 MG tablet, TAKE 1 TABLET BY MOUTH DAILY AT 6 PM (Patient taking differently: Take 80 mg by mouth every evening. ), Disp: 30 tablet, Rfl: 0 .  carvedilol (COREG) 6.25 MG tablet, Take 1 tablet (6.25 mg total) by mouth 2 (two) times daily with a meal., Disp: 180 tablet, Rfl: 3 .  Ciprofloxacin (CIPRO PO), Take by mouth., Disp: , Rfl:  .  clopidogrel (PLAVIX) 75 MG tablet, Take 1 tablet (75 mg total) by mouth daily., Disp: 30 tablet, Rfl: 11 .  Coenzyme Q10 (COQ10) 100 MG CAPS, Take 100 mg by mouth daily., Disp: , Rfl:  .  collagenase (SANTYL) ointment, Apply 1 application topically daily., Disp: 15 g, Rfl: 2 .  dapagliflozin propanediol (FARXIGA) 5 MG TABS tablet, Take 5 mg by mouth daily. ,  Disp: , Rfl:  .  furosemide (LASIX) 20 MG tablet, Take 1 tablet (20 mg total) by mouth as needed for fluid. (Patient taking differently: Take 20 mg by mouth daily as needed for fluid. ), Disp: 30 tablet, Rfl: 11 .  GARLIC PO, Take 1 tablet by mouth daily., Disp: , Rfl:  .  glucose blood (CHOICE DM FORA G20 TEST STRIPS) test strip, Use as instructed, Disp: 100 each, Rfl: 11 .  glucose monitoring kit (FREESTYLE) monitoring kit, 1 each by Does not apply route as needed for other., Disp: 1 each, Rfl: 0 .  isosorbide-hydrALAZINE (BIDIL) 20-37.5 MG tablet, Take 2 tablets by mouth 3 (three)  times daily., Disp: 180 tablet, Rfl: 0 .  Lancets (FREESTYLE) lancets, Use as instructed, Disp: 100 each, Rfl: 11 .  metFORMIN (GLUCOPHAGE) 1000 MG tablet, Take 1,000 mg by mouth 2 (two) times daily with a meal., Disp: , Rfl:  .  povidone-Iodine (BETADINE) 5 % SOLN topical solution, Apply 1 application topically 3 (three) times daily as needed for wound care (foot wound)., Disp: , Rfl:  .  sacubitril-valsartan (ENTRESTO) 97-103 MG, Take 1 tablet by mouth 2 (two) times daily., Disp: 180 tablet, Rfl: 2 .  sitaGLIPtin (JANUVIA) 100 MG tablet, Take 100 mg by mouth every evening. , Disp: , Rfl:  .  sodium chloride irrigation 0.9 % irrigation, Irrigate with 1 application as directed 4 (four) times daily as needed (wound care)., Disp: , Rfl:  .  spironolactone (ALDACTONE) 25 MG tablet, Take 1 tablet by mouth once daily (Patient taking differently: Take 25 mg by mouth every evening. ), Disp: 30 tablet, Rfl: 0 .  vitamin C (ASCORBIC ACID) 500 MG tablet, Take 500 mg by mouth daily., Disp: , Rfl:    Review of Systems  Constitutional: Negative for chills and fever.  HENT: Negative for congestion and sore throat.   Eyes: Negative for photophobia.  Respiratory: Negative for cough, shortness of breath and wheezing.   Cardiovascular: Negative for chest pain, palpitations and leg swelling.  Gastrointestinal: Negative for abdominal  pain, blood in stool, constipation, diarrhea, nausea and vomiting.  Genitourinary: Negative for dysuria, flank pain and hematuria.  Musculoskeletal: Negative for back pain and myalgias.  Skin: Positive for wound. Negative for rash.  Neurological: Negative for dizziness, weakness and headaches.  Hematological: Does not bruise/bleed easily.  Psychiatric/Behavioral: Negative for agitation, behavioral problems, dysphoric mood and suicidal ideas.       Objective:   Physical Exam Constitutional:      General: He is not in acute distress.    Appearance: Normal appearance. He is well-developed. He is not ill-appearing or diaphoretic.  HENT:     Head: Normocephalic and atraumatic.     Right Ear: Hearing and external ear normal.     Left Ear: Hearing and external ear normal.     Nose: No nasal deformity or rhinorrhea.  Eyes:     General: No scleral icterus.    Conjunctiva/sclera: Conjunctivae normal.     Right eye: Right conjunctiva is not injected.     Left eye: Left conjunctiva is not injected.     Pupils: Pupils are equal, round, and reactive to light.  Neck:     Musculoskeletal: Normal range of motion and neck supple.     Vascular: No JVD.  Cardiovascular:     Rate and Rhythm: Normal rate and regular rhythm.     Heart sounds: S1 normal and S2 normal. No murmur. No friction rub.  Pulmonary:     Effort: Pulmonary effort is normal. No respiratory distress.     Breath sounds: No wheezing.  Abdominal:     General: Abdomen is flat. Bowel sounds are normal. There is no distension.     Palpations: Abdomen is soft.     Tenderness: There is no abdominal tenderness.  Musculoskeletal: Normal range of motion.     Right shoulder: Normal.     Left shoulder: Normal.     Right hip: Normal.     Left hip: Normal.     Right knee: Normal.     Left knee: Normal.  Lymphadenopathy:     Head:     Right side  of head: No submandibular, preauricular or posterior auricular adenopathy.     Left side of  head: No submandibular, preauricular or posterior auricular adenopathy.     Cervical: No cervical adenopathy.     Right cervical: No superficial or deep cervical adenopathy.    Left cervical: No superficial or deep cervical adenopathy.  Skin:    General: Skin is warm and dry.     Coloration: Skin is not pale.     Findings: No abrasion, bruising, ecchymosis, erythema, lesion or rash.     Nails: There is no clubbing.   Neurological:     Mental Status: He is alert and oriented to person, place, and time.     Sensory: No sensory deficit.     Coordination: Coordination normal.     Gait: Gait normal.  Psychiatric:        Attention and Perception: He is attentive.        Mood and Affect: Mood normal.        Speech: Speech normal.        Behavior: Behavior normal. Behavior is cooperative.        Thought Content: Thought content normal.        Judgment: Judgment normal.      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  : MRI is pending.  I would favor amputation of these digits question is how proximal that should be.  I would hold off on continuing oral antimicrobial therapy as this is not going to solve his problem.  If he has amputation or debridement I would recommend sending specimen in sterile container for cultures so that can help target "mop up antibiotics.  At this point in time again I do not see any point in giving him oral or even IV antibiotics until we have formulated a surgical plan.  I do have a high amount of anxiety about this infection extending more proximally and resulting in more morbid amputation.  Furthermore I worry about the risk of him developing sepsis should this seeded his bloodstream and particular could also seeded his ICD requiring removal of  this device.    Certainly his blood supply is also critical to his ability to heal such surgeries.  We will await MRI as well.  Need for flu shot we gave him an influenza shot today   I spent greater than 60 minutes with the patient including greater than 50% of time in face to face counsel of the patient regarding the nature of diabetic foot infections and osteomyelitis and how we manage them and how I believe the likely will need amputation of 1 or more digits at minimum followed by antibiotics to eradicate infection and in coordination of his care

## 2019-01-31 NOTE — Telephone Encounter (Signed)
I informed pt of Dr. Leigh Aurora review of results and that he could go ahead and schedule the CT it had been approved. I told pt he would schedule the CT with [facility] [phone].

## 2019-01-31 NOTE — Telephone Encounter (Signed)
Pt called for lab results 

## 2019-01-31 NOTE — Telephone Encounter (Signed)
done

## 2019-01-31 NOTE — Telephone Encounter (Signed)
Faxed required form, clinicals from Dr. Jacqualyn Posey only and demographics to Infectious Disease.

## 2019-02-03 ENCOUNTER — Other Ambulatory Visit: Payer: Self-pay

## 2019-02-03 DIAGNOSIS — I739 Peripheral vascular disease, unspecified: Secondary | ICD-10-CM

## 2019-02-04 ENCOUNTER — Ambulatory Visit: Payer: PRIVATE HEALTH INSURANCE | Admitting: Podiatry

## 2019-02-04 ENCOUNTER — Other Ambulatory Visit: Payer: Self-pay

## 2019-02-04 ENCOUNTER — Encounter: Payer: Self-pay | Admitting: Podiatry

## 2019-02-04 DIAGNOSIS — L97511 Non-pressure chronic ulcer of other part of right foot limited to breakdown of skin: Secondary | ICD-10-CM | POA: Diagnosis not present

## 2019-02-04 DIAGNOSIS — M869 Osteomyelitis, unspecified: Secondary | ICD-10-CM

## 2019-02-07 ENCOUNTER — Ambulatory Visit
Admission: RE | Admit: 2019-02-07 | Discharge: 2019-02-07 | Disposition: A | Discharge status: Home / Self Care | Payer: 59 | Source: Ambulatory Visit | Attending: Podiatry | Admitting: Podiatry

## 2019-02-07 DIAGNOSIS — M869 Osteomyelitis, unspecified: Secondary | ICD-10-CM

## 2019-02-07 MED ORDER — IOPAMIDOL (ISOVUE-300) INJECTION 61%
100.0000 mL | Freq: Once | INTRAVENOUS | Status: AC | PRN
  Administered 2019-02-07: 10:00:00 100 mL via INTRAVENOUS

## 2019-02-08 ENCOUNTER — Ambulatory Visit: Payer: PRIVATE HEALTH INSURANCE | Admitting: Podiatry

## 2019-02-08 ENCOUNTER — Telehealth: Payer: Self-pay | Admitting: *Deleted

## 2019-02-08 ENCOUNTER — Other Ambulatory Visit: Payer: Self-pay

## 2019-02-08 ENCOUNTER — Other Ambulatory Visit (HOSPITAL_COMMUNITY): Payer: Self-pay | Admitting: *Deleted

## 2019-02-08 ENCOUNTER — Telehealth: Payer: Self-pay | Admitting: Podiatry

## 2019-02-08 VITALS — Temp 97.3°F

## 2019-02-08 DIAGNOSIS — L97511 Non-pressure chronic ulcer of other part of right foot limited to breakdown of skin: Secondary | ICD-10-CM

## 2019-02-08 DIAGNOSIS — M86171 Other acute osteomyelitis, right ankle and foot: Secondary | ICD-10-CM | POA: Diagnosis not present

## 2019-02-08 DIAGNOSIS — Z01812 Encounter for preprocedural laboratory examination: Secondary | ICD-10-CM

## 2019-02-08 MED ORDER — ATORVASTATIN CALCIUM 80 MG PO TABS: 80.0000 mg | ORAL_TABLET | Freq: Every evening | ORAL | 3 refills | Status: DC

## 2019-02-08 NOTE — Telephone Encounter (Signed)
It will be an overnight stay. I will also further discuss with Dr. Posey Pronto.

## 2019-02-08 NOTE — Telephone Encounter (Signed)
"  I have a question am I still spending the night?  If so, how long will I be in the hospital?"  Dr. Jacqualyn Posey did not mention anything to me about an overnight stay.  If he wants you to stay, it'll only be a 23 hours observation stay.  "Okay, thank you."  (Dr. Jacqualyn Posey Dr. Posey Pronto - Please advise.)

## 2019-02-08 NOTE — Telephone Encounter (Signed)
I am calling to let you know that we could not get you scheduled for surgery on Thursday.  However, we were able to get you scheduled for Friday, February 11, 2019 at 7:15 am.  Dr. Posey Pronto will be doing your surgery.  You'll probably have to be at [facility] about two hours prior to that time.  Have you had your history and physical form completed yet?  Have you seen your primary care physician within the past 30 days?  "I saw him last in July.  I have an appointment scheduled for tomorrow."  You will receive a call from a pre-op nurse.  She'll give you your exact arrival time.  You are also required to have a Covid test prior to your surgery.  Someone from pre-testing will give you a call to schedule that appointment.  "Okay, thank you."

## 2019-02-08 NOTE — Patient Instructions (Signed)

## 2019-02-08 NOTE — Telephone Encounter (Signed)
DOS: 02/11/2019 SURGICAL PROCEDURE: Transmetatarsal Amputation Rt CPT CODE: 85462 DX CODE: M86.679  [employer]  Effective Date 09/28/2017  In Network Benefits:  Individual Deductible is $3,000 with $7,035.00 met and $667.78 to meet. Individual Out of Pocket is $8,000 with $3,540.89 met and $4,459.11 to meet.  Benefits are 70% / 30% after deductible is met.  Benefits provided by [employer]ling Ref# [id number]  No prior authorization or referral are required per Cedrina H. Ref# [id number].

## 2019-02-08 NOTE — Telephone Encounter (Signed)
Noted  

## 2019-02-09 ENCOUNTER — Telehealth: Payer: Self-pay | Admitting: *Deleted

## 2019-02-09 ENCOUNTER — Ambulatory Visit (INDEPENDENT_AMBULATORY_CARE_PROVIDER_SITE_OTHER): Payer: 59 | Admitting: *Deleted

## 2019-02-09 ENCOUNTER — Other Ambulatory Visit (HOSPITAL_COMMUNITY)
Admission: RE | Admit: 2019-02-09 | Discharge: 2019-02-09 | Disposition: A | Discharge status: Home / Self Care | Payer: 59 | Source: Ambulatory Visit | Attending: Podiatry | Admitting: Podiatry

## 2019-02-09 ENCOUNTER — Encounter (HOSPITAL_COMMUNITY): Payer: Self-pay

## 2019-02-09 ENCOUNTER — Other Ambulatory Visit: Payer: Self-pay

## 2019-02-09 DIAGNOSIS — Z01812 Encounter for preprocedural laboratory examination: Secondary | ICD-10-CM | POA: Diagnosis present

## 2019-02-09 DIAGNOSIS — Z20828 Contact with and (suspected) exposure to other viral communicable diseases: Secondary | ICD-10-CM | POA: Insufficient documentation

## 2019-02-09 DIAGNOSIS — I255 Ischemic cardiomyopathy: Secondary | ICD-10-CM | POA: Diagnosis not present

## 2019-02-09 DIAGNOSIS — I5022 Chronic systolic (congestive) heart failure: Secondary | ICD-10-CM

## 2019-02-09 NOTE — Progress Notes (Signed)
Subjective: 48 year old male presents the office today for follow evaluation of wounds to his right foot.  Scheduled for CT scan on the right foot next week.  He did follow-up with infectious disease and has been directed to stop antibiotics.  He has not had any increase in swelling or redness or any drainage or pus.  He still in a surgical shoe.Denies any systemic complaints such as fevers, chills, nausea, vomiting. No acute changes since last appointment, and no other complaints at this time.   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  Resolution of the stenosis was achieved.  There was a atherosclerotic plaque in the distal anterior tibial artery above the ankle that required additional treatment.  This area will need to be monitored on subsequent follow-up ultrasounds.               #2  Greater than 80% stenosis within the first 8 cm of the origin of the peroneal artery.  This was treated with 3 mm balloon angioplasty with resolution of the stenosis.               #3  No significant inflow or outflow stenosis bilaterally               #4  The patient will be started on Plavix today for a minimum of 3 months  Objective: AAO x3, NAD Overall exam is mostly unchanged.  Still ulceration present on medial aspect the first MPJ measure about 1 x 1 cm with some fibrotic tissue present within the wound.  No probing to bone at this time.  There is localized edema to the area.  Also ulceration continues to the lateral aspect with necrotic area of the fifth metatarsal head.  Edema to this area.  Swelling present there is no fluctuation crepitation.   No pain with calf compression, swelling, warmth, erythema  Assessment: Chronic ulcerations with concern for osteomyelitis  Plan: -All treatment options discussed with the patient including all alternatives, risks, complications.  -Overall wounds appear to be  mostly unchanged.  We are awaiting CT scan results.  Discussed with them today likely amputation of either partial ray amputations versus possible transmetatarsal fixation.  I encouraged him to bring his wife with him to the next appointment so they can further discuss.  For now continue surgical shoe.  Daily dressing changes of the small amount of Betadine. -Patient encouraged to call the office with any questions, concerns, change in symptoms.   No follow-ups on file.  Trula Slade DPM

## 2019-02-09 NOTE — Progress Notes (Signed)
Subjective: 48 year old male presents the office today for follow evaluation of wounds to his right foot at this point he is ready to schedule surgery as he states that he is ready to "move on from this".  Denies any drainage or pus coming from the wounds.  His wife not able to accompany him today so she was on speaker phone today. Denies any systemic complaints such as fevers, chills, nausea, vomiting. No acute changes since last appointment, and no other complaints at this time.   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  Resolution of the stenosis was achieved.  There was a atherosclerotic plaque in the distal anterior tibial artery above the ankle that required additional treatment.  This area will need to be monitored on subsequent follow-up ultrasounds.               #2  Greater than 80% stenosis within the first 8 cm of the origin of the peroneal artery.  This was treated with 3 mm balloon angioplasty with resolution of the stenosis.               #3  No significant inflow or outflow stenosis bilaterally               #4  The patient will be started on Plavix today for a minimum of 3 months  Objective: AAO x3, NAD Overall exam is mostly unchanged.  Still ulceration present on medial aspect the first MPJ measure about 1 x 1 cm with some fibrotic tissue present within the wound.  No probing to bone at this time.  There is localized edema to the area.  Also ulceration continues to the lateral aspect with necrotic area of the fifth metatarsal head.  Edema to this area.  Swelling present there is no fluctuation crepitation.  Overall mostly unchanged. No pain with calf compression, swelling, warmth, erythema  Assessment: Chronic ulcerations with concern for osteomyelitis  Plan: -All treatment options discussed with the patient including all alternatives, risks, complications.  -Reviewed the CT  scan with him.  Also I independently reviewed the CT scan does appear his osteomyelitis of the fifth metatarsal as well as the first.  I did talk to the radiologist and confirmed osteomyelitis of the fifth metatarsal head. -We discussed treatment options both limb salvage as well as amputation at this point I do recommend amputation.  Discussed partial first and fifth ray amputations versus transmetatarsal amputation.  After discussion elects proceed with transmetatarsal amputation.  We will plan on doing this later this week at [facility] long hospital most likely will stay overnight for observation.  Unfortunately due to my schedule I may not be able to do this and if not then Dr. Posey Pronto will do the surgery and I will discuss with him.  The patient is in agreement of this. -The incision placement as well as the postoperative course was discussed with the patient. I discussed risks of the surgery which include, but not limited to, infection, bleeding, pain, swelling, need for further surgery, delayed or nonhealing, painful or ugly scar, numbness or sensation changes, over/under correction, recurrence, transfer lesions, further deformity, hardware failure, DVT/PE, loss of toe/foot. Patient understands these risks and wishes to proceed with surgery. The surgical consent was reviewed with the patient all 3 pages were signed. No promises or guarantees were given to  the outcome of the procedure. All questions were answered to the best of my ability. Before the surgery the patient was encouraged to call the office if there is any further questions. The surgery will be performed at [place] DPM

## 2019-02-09 NOTE — Telephone Encounter (Signed)
"  I'm returning your call."  I was calling to confirm that yes, you will have a 23 hours observation stay after your surgery.  "That will be fine.  Thank you for calling."

## 2019-02-09 NOTE — Telephone Encounter (Addendum)
I called and informed Christopher Roth, Central Scheduling, that Christopher Roth is going to stay at the hospital for a 23 hours observation stay after his surgery.   I attempted to call and inform Christopher Roth about the overnight stay.  His voicemail is not set up, so I could not leave a message.

## 2019-02-09 NOTE — Telephone Encounter (Signed)
Pt states he went to his PCP today, PCP changed the [place] to [place]. Pt states he was anxious to get this foot taken care of and wanted to know if he had to have all 5 toes removed or if just the diseased ones, but if it was necessary to get better he would be fine with all the toes being removed. I told pt I would inform Dr. Jacqualyn Posey and Dr. Posey Pronto, and I would either call or one of the doctors would call him.

## 2019-02-09 NOTE — Telephone Encounter (Signed)
Pt request a call from Dr. Jacqualyn Posey or Dr. Posey Pronto.

## 2019-02-09 NOTE — Progress Notes (Addendum)
PCP - Dr. Tonette Bihari  Cardiologist - Dr. [name]  Chest x-ray - Denies  EKG - 05/11/2018 (E)  Stress Test - Denies  ECHO - 12/23/17 (E)  Cardiac Cath - 04/23/16 (E)  [place]- faxed to Dr. Virl Axe PM-na LOOP-na  Sleep Study - Denies CPAP - Denies  LABS- 01/28/2019 (E): CBC w/D, BMP 02/09/2019: COVID  ASA-LD- 10/6 Plavix- LD- 10/6  ERAS- No  HA1C- 01/28/2019 (E): 7.7 Fasting Blood Sugar -101-180  Checks Blood Sugar __2___ times a day  Anesthesia- Yes- cardiac history  Pt denies having chest pain, sob, or fever during the pre-op phone call. All instructions explained to the pt, with a verbal understanding of the material, including, as of today, stop taking all Aspirin (unless instructed by your doctor) and Other Aspirin containing products, Vitamins, Fish oils, and Herbal medications. Also stop all NSAIDS i.e. Advil, Ibuprofen, Motrin, Aleve, Anaprox, Naproxen, BC, Goody Powders, and all Supplements.  How do I manage my blood sugar before surgery? . Check your blood sugar at least 4 times a day, starting 2 days before surgery, to make sure that the level is not too high or low. o Check your blood sugar the morning of your surgery when you wake up and every 2 hours until you get to the Short Stay unit. . If your blood sugar is less than 70 mg/dL, you will need to treat for low blood sugar: o Do not take insulin. o Treat a low blood sugar (less than 70 mg/dL) with  cup of clear juice (cranberry or apple), 4 glucose tablets, OR glucose gel. Recheck blood sugar in 15 minutes after treatment (to make sure it is greater than 70 mg/dL). If your blood sugar is not greater than 70 mg/dL on recheck, call [phone] o  for further instructions. . If your CBG is greater than 220 mg/dL, inform the staff upon arrival to Short Stay.  . Do not take dapagliflozin Propanediol (FARXIGA) after 02/09/19, until after your surgery.  . Do not take MetFORMIN (GLUCOPHAGE) and  SitaGLIPtin (JANUVIA) the morning of surgery.    Reviewed and Endorsed by [facility] Patient Education Committee, August 2015   Pt also instructed to self quarantine after being tested for COVID-19. The opportunity to ask questions was provided.  Coronavirus Screening  Have you experienced the following symptoms:  Cough yes/no: No Fever (>100.9F)  yes/no: No Runny nose yes/no: No Sore throat yes/no: No Difficulty breathing/shortness of breath  yes/no: No  Have you or a family member traveled in the last 14 days and where? yes/no: No   If the patient indicates "YES" to the above questions, their PAT will be rescheduled to limit the exposure to others and, the surgeon will be notified. THE PATIENT WILL NEED TO BE ASYMPTOMATIC FOR 14 DAYS.   If the patient is not experiencing any of these symptoms, the PAT nurse will instruct them to NOT bring anyone with them to their appointment since they may have these symptoms or traveled as well.   Please remind your patients and families that hospital visitation restrictions are in effect and the importance of the restrictions.

## 2019-02-09 NOTE — Progress Notes (Signed)
Spoke with Dr. Posey Pronto regarding ASA and Plavix, pt can continue taking. Dr. Posey Pronto also made aware of the need for surgical orders.

## 2019-02-10 LAB — CUP PACEART REMOTE DEVICE CHECK
Date Time Interrogation Session: 20201008130446
Implantable Lead Implant Date: 20190925
Implantable Lead Location: 753860
Implantable Lead Model: 293
Implantable Lead Serial Number: 441633
Implantable Pulse Generator Implant Date: 20190925
Lead Channel Setting Pacing Amplitude: 2.5 V
Lead Channel Setting Pacing Pulse Width: 0.4 ms
Lead Channel Setting Sensing Sensitivity: 0.5 mV
Pulse Gen Serial Number: 244469

## 2019-02-10 LAB — SARS CORONAVIRUS 2 (TAT 6-24 HRS): SARS Coronavirus 2: NEGATIVE

## 2019-02-10 NOTE — Anesthesia Preprocedure Evaluation (Addendum)
Anesthesia Evaluation  Patient identified by MRN, date of birth, ID band Patient awake    Reviewed: Allergy & Precautions, NPO status , Patient's Chart, lab work & pertinent test results  Airway Mallampati: II  TM Distance: >3 FB Neck ROM: Full    Dental no notable dental hx. (+) Poor Dentition, Loose, Dental Advisory Given,    Pulmonary neg pulmonary ROS, Patient abstained from smoking., former smoker,    Pulmonary exam normal breath sounds clear to auscultation       Cardiovascular hypertension, Pt. on medications and Pt. on home beta blockers + CAD and +CHF  Normal cardiovascular exam+ Cardiac Defibrillator  Rhythm:Regular Rate:Normal     Neuro/Psych negative neurological ROS  negative psych ROS   GI/Hepatic Neg liver ROS, GERD  ,  Endo/Other  negative endocrine ROSdiabetes, Type 2, Oral Hypoglycemic Agents  Renal/GU negative Renal ROS  negative genitourinary   Musculoskeletal negative musculoskeletal ROS (+)   Abdominal (+) + obese,   Peds negative pediatric ROS (+)  Hematology negative hematology ROS (+)   Anesthesia Other Findings   Reproductive/Obstetrics negative OB ROS                           Anesthesia Physical Anesthesia Plan  ASA: III  Anesthesia Plan: General   Post-op Pain Management:    Induction: Intravenous  PONV Risk Score and Plan: 2 and Ondansetron, Midazolam and Treatment may vary due to age or medical condition  Airway Management Planned: LMA  Additional Equipment:   Intra-op Plan:   Post-operative Plan: Extubation in OR  Informed Consent: I have reviewed the patients History and Physical, chart, labs and discussed the procedure including the risks, benefits and alternatives for the proposed anesthesia with the patient or authorized representative who has indicated his/her understanding and acceptance.     Dental advisory given  Plan Discussed  with: CRNA  Anesthesia Plan Comments: (Follows with cardiology for hx of HTN, CAD, and systolic CHF. S/P [employer] ICD 01/2018. Cath with chronic occlusion of LAD with no viability on MRI. Moderate non-obstructive CAD elsewhere - medical therapy recommended. TTE 8/19 with EF 25-30%. S/P [employer] ICD 01/2018. Last seen by cardiology 06/11/18, per note he was NYHA 1 at that time, volume status stable. Last remote transmission 11/10/18 showed normal function. Device form was faxed to Dr. Caryl Comes.  Being followed by ID, Dr. 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 Need for amputation felt to be somewhat urgent. Per Dr. Lucianne Lei Dam's note 01/31/19 "I do have a high amount of anxiety about this infection extending more proximally and resulting in more morbid amputation. Furthermore I worry about the risk of him developing sepsis should this seeded his bloodstream and particular could also seeded his ICD requiring removal of this device."  Also being followed by [facility] for PAD. On 01/18/19 he underwent successful balloon angioplasty of the anterior tibial artery and peroneal artery.  TTE 12/23/17: - Left ventricle: The cavity size was mildly dilated. There was   mild concentric hypertrophy. Systolic function was severely   reduced. The estimated ejection fraction was in the range of 25%   to 30%. Diffuse hypokinesis. Doppler parameters are consistent   with a reversible restrictive pattern, indicative of decreased   left ventricular diastolic compliance and/or increased left   atrial pressure (grade 3 diastolic dysfunction). Doppler   parameters are consistent with high ventricular  filling pressure.   Cannot exclude apical thrombus. - Aortic valve: Transvalvular velocity was within the normal range.   There was no stenosis. There was no regurgitation. - Mitral valve: Transvalvular velocity was within the normal  range.   There was no evidence for stenosis. There was no regurgitation. - Left atrium: The atrium was severely dilated. - Right ventricle: The cavity size was normal. Wall thickness was   normal. Systolic function was normal. - Right atrium: The atrium was moderately dilated. - Tricuspid valve: There was no regurgitation. - Global longitudinal strain -9.6% (abnormal). Impressions: - Cannot rule out apical thrombus. Consider repeating a limited   echo with Definity contrast.  cMRI 07/16/16 -> recalculated 08/19/16: EF 41% scar in LAD distribution. No viability   LHC 04/23/16 - Recent total occlusion of the proximal to mid LAD.  - 70% ramus intermedius, 65% ostial circumflex followed by 70% mid circumflex, 75% proximal LAD prior to the origin of a small to moderate size diagonal, and 90% mid PDA. - Mild PAH, PCWP 14 - LVEF 10-15% )       Anesthesia Quick Evaluation

## 2019-02-10 NOTE — Telephone Encounter (Signed)
I will give him a call today and discuss everything in detail.  Thanks   Boneta Lucks

## 2019-02-10 NOTE — Progress Notes (Signed)
Anesthesia Chart Review: Same day workup  Follows with cardiology for hx of HTN, CAD, and systolic CHF. S/P [employer] ICD 01/2018. Cath with chronic occlusion of LAD with no viability on MRI. Moderate non-obstructive CAD elsewhere - medical therapy recommended. TTE 8/19 with EF 25-30%. S/P [employer] ICD 01/2018. Last seen by cardiology 06/11/18, per note he was NYHA 1 at that time, volume status stable. Last remote transmission 11/10/18 showed normal function. Device form was faxed to Dr. Caryl Comes.  Being followed by ID, Dr. 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 Need for amputation felt to be somewhat urgent. Per Dr. Lucianne Lei Dam's note 01/31/19 "I do have a high amount of anxiety about this infection extending more proximally and resulting in more morbid amputation. Furthermore I worry about the risk of him developing sepsis should this seeded his bloodstream and particular could also seeded his ICD requiring removal of this device."  Also being followed by [facility] for PAD. On 01/18/19 he underwent successful balloon angioplasty of the anterior tibial artery and peroneal artery.  TTE 12/23/17: - Left ventricle: The cavity size was mildly dilated. There was   mild concentric hypertrophy. Systolic function was severely   reduced. The estimated ejection fraction was in the range of 25%   to 30%. Diffuse hypokinesis. Doppler parameters are consistent   with a reversible restrictive pattern, indicative of decreased   left ventricular diastolic compliance and/or increased left   atrial pressure (grade 3 diastolic dysfunction). Doppler   parameters are consistent with high ventricular filling pressure.   Cannot exclude apical thrombus. - Aortic valve: Transvalvular velocity was within the normal range.   There was no stenosis. There was no regurgitation. - Mitral valve: Transvalvular velocity was within the normal  range.   There was no evidence for stenosis. There was no regurgitation. - Left atrium: The atrium was severely dilated. - Right ventricle: The cavity size was normal. Wall thickness was   normal. Systolic function was normal. - Right atrium: The atrium was moderately dilated. - Tricuspid valve: There was no regurgitation. - Global longitudinal strain -9.6% (abnormal). Impressions: - Cannot rule out apical thrombus. Consider repeating a limited   echo with Definity contrast.  cMRI 07/16/16 -> recalculated 08/19/16: EF 41% scar in LAD distribution. No viability   LHC 04/23/16 - Recent total occlusion of the proximal to mid LAD.  - 70% ramus intermedius, 65% ostial circumflex followed by 70% mid circumflex, 75% proximal LAD prior to the origin of a small to moderate size diagonal, and 90% mid PDA. - Mild PAH, PCWP 14 - LVEF 10-15%    [facility] Short Stay Center/Anesthesiology Phone [phone] 02/10/2019 10:24 AM

## 2019-02-11 ENCOUNTER — Ambulatory Visit (HOSPITAL_COMMUNITY): Payer: 59 | Admitting: Physician Assistant

## 2019-02-11 ENCOUNTER — Encounter (HOSPITAL_COMMUNITY): Payer: Self-pay

## 2019-02-11 ENCOUNTER — Encounter (HOSPITAL_COMMUNITY): Payer: 59

## 2019-02-11 ENCOUNTER — Encounter (HOSPITAL_COMMUNITY)
Admission: RE | Disposition: A | Discharge status: Home / Self Care | Payer: Self-pay | Source: Home / Self Care | Attending: Podiatry

## 2019-02-11 ENCOUNTER — Observation Stay (HOSPITAL_COMMUNITY): Payer: 59

## 2019-02-11 ENCOUNTER — Observation Stay (HOSPITAL_COMMUNITY)
Admission: RE | Admit: 2019-02-11 | Discharge: 2019-02-12 | Disposition: A | Discharge status: Home / Self Care | Payer: 59 | Attending: Podiatry | Admitting: Podiatry

## 2019-02-11 ENCOUNTER — Other Ambulatory Visit: Payer: Self-pay

## 2019-02-11 ENCOUNTER — Ambulatory Visit: Payer: 59 | Admitting: Vascular Surgery

## 2019-02-11 ENCOUNTER — Ambulatory Visit (HOSPITAL_COMMUNITY): Payer: 59

## 2019-02-11 DIAGNOSIS — F17290 Nicotine dependence, other tobacco product, uncomplicated: Secondary | ICD-10-CM | POA: Diagnosis not present

## 2019-02-11 DIAGNOSIS — E1169 Type 2 diabetes mellitus with other specified complication: Principal | ICD-10-CM | POA: Diagnosis present

## 2019-02-11 DIAGNOSIS — M216X1 Other acquired deformities of right foot: Secondary | ICD-10-CM | POA: Diagnosis not present

## 2019-02-11 DIAGNOSIS — IMO0002 Reserved for concepts with insufficient information to code with codable children: Secondary | ICD-10-CM | POA: Diagnosis present

## 2019-02-11 DIAGNOSIS — E785 Hyperlipidemia, unspecified: Secondary | ICD-10-CM | POA: Diagnosis not present

## 2019-02-11 DIAGNOSIS — I110 Hypertensive heart disease with heart failure: Secondary | ICD-10-CM | POA: Diagnosis not present

## 2019-02-11 DIAGNOSIS — M869 Osteomyelitis, unspecified: Secondary | ICD-10-CM | POA: Diagnosis present

## 2019-02-11 DIAGNOSIS — M86671 Other chronic osteomyelitis, right ankle and foot: Secondary | ICD-10-CM | POA: Diagnosis not present

## 2019-02-11 DIAGNOSIS — I739 Peripheral vascular disease, unspecified: Secondary | ICD-10-CM | POA: Diagnosis present

## 2019-02-11 DIAGNOSIS — I5022 Chronic systolic (congestive) heart failure: Secondary | ICD-10-CM | POA: Diagnosis not present

## 2019-02-11 DIAGNOSIS — I2510 Atherosclerotic heart disease of native coronary artery without angina pectoris: Secondary | ICD-10-CM | POA: Diagnosis not present

## 2019-02-11 DIAGNOSIS — Z95810 Presence of automatic (implantable) cardiac defibrillator: Secondary | ICD-10-CM | POA: Diagnosis not present

## 2019-02-11 DIAGNOSIS — Z89439 Acquired absence of unspecified foot: Secondary | ICD-10-CM

## 2019-02-11 DIAGNOSIS — I255 Ischemic cardiomyopathy: Secondary | ICD-10-CM | POA: Diagnosis not present

## 2019-02-11 DIAGNOSIS — Z89431 Acquired absence of right foot: Secondary | ICD-10-CM | POA: Diagnosis not present

## 2019-02-11 DIAGNOSIS — E1151 Type 2 diabetes mellitus with diabetic peripheral angiopathy without gangrene: Secondary | ICD-10-CM | POA: Diagnosis not present

## 2019-02-11 DIAGNOSIS — Q6691 Congenital deformity of feet, unspecified, right foot: Secondary | ICD-10-CM | POA: Diagnosis not present

## 2019-02-11 DIAGNOSIS — I10 Essential (primary) hypertension: Secondary | ICD-10-CM | POA: Diagnosis present

## 2019-02-11 DIAGNOSIS — L97416 Non-pressure chronic ulcer of right heel and midfoot with bone involvement without evidence of necrosis: Secondary | ICD-10-CM | POA: Insufficient documentation

## 2019-02-11 DIAGNOSIS — E1165 Type 2 diabetes mellitus with hyperglycemia: Secondary | ICD-10-CM | POA: Diagnosis present

## 2019-02-11 DIAGNOSIS — E11621 Type 2 diabetes mellitus with foot ulcer: Secondary | ICD-10-CM | POA: Diagnosis not present

## 2019-02-11 DIAGNOSIS — M86171 Other acute osteomyelitis, right ankle and foot: Secondary | ICD-10-CM | POA: Diagnosis not present

## 2019-02-11 HISTORY — DX: Type 2 diabetes mellitus with diabetic peripheral angiopathy without gangrene: E11.51

## 2019-02-11 HISTORY — PX: AMPUTATION TOE: SHX6595

## 2019-02-11 HISTORY — DX: Peripheral vascular disease, unspecified: I73.9

## 2019-02-11 HISTORY — DX: Gastro-esophageal reflux disease without esophagitis: K21.9

## 2019-02-11 HISTORY — PX: AMPUTATION: SHX166

## 2019-02-11 LAB — GLUCOSE, CAPILLARY
Glucose-Capillary: 132 mg/dL — ABNORMAL HIGH (ref 70–99)
Glucose-Capillary: 115 mg/dL — ABNORMAL HIGH (ref 70–99)
Glucose-Capillary: 204 mg/dL — ABNORMAL HIGH (ref 70–99)
Glucose-Capillary: 146 mg/dL — ABNORMAL HIGH (ref 70–99)
Glucose-Capillary: 295 mg/dL — ABNORMAL HIGH (ref 70–99)

## 2019-02-11 LAB — CBC
HCT: 28.7 % — ABNORMAL LOW (ref 39.0–52.0)
Hemoglobin: 9.5 g/dL — ABNORMAL LOW (ref 13.0–17.0)
MCH: 28.2 pg (ref 26.0–34.0)
MCHC: 33.1 g/dL (ref 30.0–36.0)
MCV: 85.2 fL (ref 80.0–100.0)
Platelets: 305 10*3/uL (ref 150–400)
RBC: 3.37 MIL/uL — ABNORMAL LOW (ref 4.22–5.81)
RDW: 16.5 % — ABNORMAL HIGH (ref 11.5–15.5)
WBC: 7 10*3/uL (ref 4.0–10.5)
nRBC: 0 % (ref 0.0–0.2)

## 2019-02-11 LAB — BASIC METABOLIC PANEL
Anion gap: 5 (ref 5–15)
BUN: 10 mg/dL (ref 6–20)
CO2: 23 mmol/L (ref 22–32)
Calcium: 8.9 mg/dL (ref 8.9–10.3)
Chloride: 114 mmol/L — ABNORMAL HIGH (ref 98–111)
Creatinine, Ser: 0.98 mg/dL (ref 0.61–1.24)
GFR calc Af Amer: >60 mL/min (ref >60)
GFR calc non Af Amer: >60 mL/min (ref >60)
Glucose, Bld: 163 mg/dL — ABNORMAL HIGH (ref 70–99)
Potassium: 4.7 mmol/L (ref 3.5–5.1)
Sodium: 142 mmol/L (ref 135–145)

## 2019-02-11 LAB — HIV ANTIBODY (ROUTINE TESTING W REFLEX): HIV Screen 4th Generation wRfx: NONREACTIVE

## 2019-02-11 SURGERY — AMPUTATION, TOE
Anesthesia: General | Site: Toe | Laterality: Right

## 2019-02-11 MED ORDER — OXYCODONE HCL 5 MG PO TABS: 5.0000 mg | ORAL_TABLET | Freq: Once | ORAL | Status: DC | PRN

## 2019-02-11 MED ORDER — 0.9 % SODIUM CHLORIDE (POUR BTL) OPTIME
TOPICAL | Status: DC | PRN
  Administered 2019-02-11: 07:00:00 1000 mL

## 2019-02-11 MED ORDER — ACETAMINOPHEN 325 MG PO TABS: 650.0000 mg | ORAL_TABLET | Freq: Four times a day (QID) | ORAL | Status: DC | PRN

## 2019-02-11 MED ORDER — PROPOFOL 10 MG/ML IV BOLUS
INTRAVENOUS | Status: AC
  Filled 2019-02-11: qty 20

## 2019-02-11 MED ORDER — MORPHINE SULFATE (PF) 2 MG/ML IV SOLN: 2.0000 mg | INTRAVENOUS | Status: DC | PRN

## 2019-02-11 MED ORDER — MIDAZOLAM HCL 2 MG/2ML IJ SOLN
INTRAMUSCULAR | Status: AC
  Filled 2019-02-11: qty 2

## 2019-02-11 MED ORDER — PHENYLEPHRINE 40 MCG/ML (10ML) SYRINGE FOR IV PUSH (FOR BLOOD PRESSURE SUPPORT)
PREFILLED_SYRINGE | INTRAVENOUS | Status: AC
  Filled 2019-02-11: qty 10

## 2019-02-11 MED ORDER — EPHEDRINE SULFATE-NACL 50-0.9 MG/10ML-% IV SOSY
PREFILLED_SYRINGE | INTRAVENOUS | Status: DC | PRN
  Administered 2019-02-11: 5 mg via INTRAVENOUS
  Administered 2019-02-11 (×3): 10 mg via INTRAVENOUS
  Administered 2019-02-11 (×3): 5 mg via INTRAVENOUS

## 2019-02-11 MED ORDER — INSULIN ASPART 100 UNIT/ML ~~LOC~~ SOLN
0.0000 [IU] | Freq: Three times a day (TID) | SUBCUTANEOUS | Status: DC
  Administered 2019-02-11: 2 [IU] via SUBCUTANEOUS
  Administered 2019-02-11: 12:00:00 5 [IU] via SUBCUTANEOUS
  Administered 2019-02-12 (×2): 2 [IU] via SUBCUTANEOUS

## 2019-02-11 MED ORDER — FENTANYL CITRATE (PF) 100 MCG/2ML IJ SOLN
INTRAMUSCULAR | Status: DC | PRN
  Administered 2019-02-11: 25 ug via INTRAVENOUS
  Administered 2019-02-11 (×3): 50 ug via INTRAVENOUS

## 2019-02-11 MED ORDER — DEXAMETHASONE SODIUM PHOSPHATE 10 MG/ML IJ SOLN
INTRAMUSCULAR | Status: AC
  Filled 2019-02-11: qty 1

## 2019-02-11 MED ORDER — ASPIRIN EC 81 MG PO TBEC
81.0000 mg | DELAYED_RELEASE_TABLET | Freq: Every day | ORAL | Status: DC
  Administered 2019-02-12: 81 mg via ORAL
  Filled 2019-02-11: qty 1

## 2019-02-11 MED ORDER — LIDOCAINE HCL (CARDIAC) PF 100 MG/5ML IV SOSY
PREFILLED_SYRINGE | INTRAVENOUS | Status: DC | PRN
  Administered 2019-02-11: 100 mg via INTRAVENOUS

## 2019-02-11 MED ORDER — CARVEDILOL 6.25 MG PO TABS
6.2500 mg | ORAL_TABLET | Freq: Two times a day (BID) | ORAL | Status: DC
  Administered 2019-02-11 – 2019-02-12 (×2): 6.25 mg via ORAL
  Filled 2019-02-11 (×2): qty 1

## 2019-02-11 MED ORDER — FENTANYL CITRATE (PF) 250 MCG/5ML IJ SOLN
INTRAMUSCULAR | Status: AC
  Filled 2019-02-11: qty 5

## 2019-02-11 MED ORDER — ONDANSETRON HCL 4 MG PO TABS: 4.0000 mg | ORAL_TABLET | Freq: Four times a day (QID) | ORAL | Status: DC | PRN

## 2019-02-11 MED ORDER — ONDANSETRON HCL 4 MG/2ML IJ SOLN
INTRAMUSCULAR | Status: DC | PRN
  Administered 2019-02-11: 4 mg via INTRAVENOUS

## 2019-02-11 MED ORDER — ONDANSETRON HCL 4 MG/2ML IJ SOLN
INTRAMUSCULAR | Status: AC
  Filled 2019-02-11: qty 2

## 2019-02-11 MED ORDER — CEFAZOLIN SODIUM-DEXTROSE 1-4 GM/50ML-% IV SOLN
1.0000 g | Freq: Three times a day (TID) | INTRAVENOUS | Status: AC
  Administered 2019-02-11 – 2019-02-12 (×3): 1 g via INTRAVENOUS
  Filled 2019-02-11 (×3): qty 50

## 2019-02-11 MED ORDER — SPIRONOLACTONE 25 MG PO TABS
25.0000 mg | ORAL_TABLET | Freq: Every evening | ORAL | Status: DC
  Administered 2019-02-11: 25 mg via ORAL
  Filled 2019-02-11: qty 1

## 2019-02-11 MED ORDER — LIDOCAINE 2% (20 MG/ML) 5 ML SYRINGE
INTRAMUSCULAR | Status: AC
  Filled 2019-02-11: qty 5

## 2019-02-11 MED ORDER — CEFAZOLIN SODIUM-DEXTROSE 2-4 GM/100ML-% IV SOLN
INTRAVENOUS | Status: AC
  Filled 2019-02-11: qty 100

## 2019-02-11 MED ORDER — DOCUSATE SODIUM 100 MG PO CAPS
100.0000 mg | ORAL_CAPSULE | Freq: Two times a day (BID) | ORAL | Status: DC
  Administered 2019-02-11: 100 mg via ORAL
  Filled 2019-02-11 (×3): qty 1

## 2019-02-11 MED ORDER — MEPERIDINE HCL 25 MG/ML IJ SOLN: 6.2500 mg | INTRAMUSCULAR | Status: DC | PRN

## 2019-02-11 MED ORDER — ONDANSETRON HCL 4 MG/2ML IJ SOLN: 4.0000 mg | Freq: Four times a day (QID) | INTRAMUSCULAR | Status: DC | PRN

## 2019-02-11 MED ORDER — SACUBITRIL-VALSARTAN 97-103 MG PO TABS
1.0000 | ORAL_TABLET | Freq: Two times a day (BID) | ORAL | Status: DC
  Administered 2019-02-11 – 2019-02-12 (×2): 1 via ORAL
  Filled 2019-02-11 (×3): qty 1

## 2019-02-11 MED ORDER — HEPARIN SODIUM (PORCINE) 5000 UNIT/ML IJ SOLN
5000.0000 [IU] | Freq: Three times a day (TID) | INTRAMUSCULAR | Status: DC
  Administered 2019-02-11 – 2019-02-12 (×3): 5000 [IU] via SUBCUTANEOUS
  Filled 2019-02-11 (×3): qty 1

## 2019-02-11 MED ORDER — DEXAMETHASONE SODIUM PHOSPHATE 4 MG/ML IJ SOLN
INTRAMUSCULAR | Status: DC | PRN
  Administered 2019-02-11: 4 mg via INTRAVENOUS

## 2019-02-11 MED ORDER — CEFAZOLIN SODIUM-DEXTROSE 2-3 GM-%(50ML) IV SOLR
INTRAVENOUS | Status: DC | PRN
  Administered 2019-02-11: 2 g via INTRAVENOUS

## 2019-02-11 MED ORDER — STERILE WATER FOR IRRIGATION IR SOLN
Status: DC | PRN
  Administered 2019-02-11: 1000 mL

## 2019-02-11 MED ORDER — CEFAZOLIN (ANCEF) 1 G IV SOLR: 2.0000 g | INTRAVENOUS | Status: DC

## 2019-02-11 MED ORDER — MIDAZOLAM HCL 5 MG/5ML IJ SOLN
INTRAMUSCULAR | Status: DC | PRN
  Administered 2019-02-11: 2 mg via INTRAVENOUS

## 2019-02-11 MED ORDER — ISOSORB DINITRATE-HYDRALAZINE 20-37.5 MG PO TABS
2.0000 | ORAL_TABLET | Freq: Three times a day (TID) | ORAL | Status: DC
  Administered 2019-02-11 – 2019-02-12 (×2): 2 via ORAL
  Filled 2019-02-11 (×5): qty 2

## 2019-02-11 MED ORDER — ALBUTEROL SULFATE (2.5 MG/3ML) 0.083% IN NEBU: 3.0000 mL | INHALATION_SOLUTION | Freq: Four times a day (QID) | RESPIRATORY_TRACT | Status: DC | PRN

## 2019-02-11 MED ORDER — PROMETHAZINE HCL 25 MG/ML IJ SOLN: 6.2500 mg | INTRAMUSCULAR | Status: DC | PRN

## 2019-02-11 MED ORDER — CLOPIDOGREL BISULFATE 75 MG PO TABS
75.0000 mg | ORAL_TABLET | Freq: Every day | ORAL | Status: DC
  Administered 2019-02-12: 75 mg via ORAL
  Filled 2019-02-11: qty 1

## 2019-02-11 MED ORDER — EPHEDRINE 5 MG/ML INJ
INTRAVENOUS | Status: AC
  Filled 2019-02-11: qty 20

## 2019-02-11 MED ORDER — SODIUM CHLORIDE 0.9 % IR SOLN
Status: DC | PRN
  Administered 2019-02-11: 3000 mL

## 2019-02-11 MED ORDER — HYDROMORPHONE HCL 1 MG/ML IJ SOLN
0.2500 mg | INTRAMUSCULAR | Status: DC | PRN
  Administered 2019-02-11 (×2): 0.5 mg via INTRAVENOUS

## 2019-02-11 MED ORDER — HYDROMORPHONE HCL 1 MG/ML IJ SOLN
INTRAMUSCULAR | Status: AC
  Filled 2019-02-11: qty 1

## 2019-02-11 MED ORDER — LACTATED RINGERS IV SOLN
INTRAVENOUS | Status: DC | PRN
  Administered 2019-02-11: 07:00:00 via INTRAVENOUS

## 2019-02-11 MED ORDER — PROPOFOL 10 MG/ML IV BOLUS
INTRAVENOUS | Status: DC | PRN
  Administered 2019-02-11: 150 mg via INTRAVENOUS

## 2019-02-11 MED ORDER — OXYCODONE HCL 5 MG/5ML PO SOLN: 5.0000 mg | Freq: Once | ORAL | Status: DC | PRN

## 2019-02-11 MED ORDER — ACETAMINOPHEN 650 MG RE SUPP: 650.0000 mg | Freq: Four times a day (QID) | RECTAL | Status: DC | PRN

## 2019-02-11 MED ORDER — PHENYLEPHRINE 40 MCG/ML (10ML) SYRINGE FOR IV PUSH (FOR BLOOD PRESSURE SUPPORT)
PREFILLED_SYRINGE | INTRAVENOUS | Status: DC | PRN
  Administered 2019-02-11: 80 ug via INTRAVENOUS
  Administered 2019-02-11: 40 ug via INTRAVENOUS

## 2019-02-11 MED ORDER — EPHEDRINE 5 MG/ML INJ
INTRAVENOUS | Status: AC
  Filled 2019-02-11: qty 10

## 2019-02-11 MED ORDER — INSULIN ASPART 100 UNIT/ML ~~LOC~~ SOLN
0.0000 [IU] | Freq: Every day | SUBCUTANEOUS | Status: DC
  Administered 2019-02-11: 3 [IU] via SUBCUTANEOUS

## 2019-02-11 MED ORDER — ATORVASTATIN CALCIUM 80 MG PO TABS
80.0000 mg | ORAL_TABLET | Freq: Every evening | ORAL | Status: DC
  Administered 2019-02-11: 80 mg via ORAL
  Filled 2019-02-11: qty 1

## 2019-02-11 MED ORDER — HYDROCODONE-ACETAMINOPHEN 5-325 MG PO TABS
1.0000 | ORAL_TABLET | ORAL | Status: DC | PRN
  Administered 2019-02-11 (×2): 1 via ORAL
  Administered 2019-02-12 (×2): 2 via ORAL
  Filled 2019-02-11 (×3): qty 2
  Filled 2019-02-11 (×2): qty 1

## 2019-02-11 SURGICAL SUPPLY — 40 items
BLADE LONG MED 31X9 (MISCELLANEOUS) ×2 IMPLANT
BNDG ELASTIC 4X5.8 VLCR STR LF (GAUZE/BANDAGES/DRESSINGS) ×3 IMPLANT
BNDG GAUZE ELAST 4 BULKY (GAUZE/BANDAGES/DRESSINGS) ×2 IMPLANT
COVER SURGICAL LIGHT HANDLE (MISCELLANEOUS) ×2 IMPLANT
COVER WAND RF STERILE (DRAPES) ×2 IMPLANT
DRSG EMULSION OIL 3X3 NADH (GAUZE/BANDAGES/DRESSINGS) ×2 IMPLANT
DRSG TEGADERM 4X4.75 (GAUZE/BANDAGES/DRESSINGS) ×2 IMPLANT
ELECT REM PT RETURN 9FT ADLT (ELECTROSURGICAL) ×2
ELECTRODE REM PT RTRN 9FT ADLT (ELECTROSURGICAL) ×1 IMPLANT
GAUZE SPONGE 4X4 12PLY STRL (GAUZE/BANDAGES/DRESSINGS) ×2 IMPLANT
GAUZE SPONGE 4X4 12PLY STRL LF (GAUZE/BANDAGES/DRESSINGS) ×2 IMPLANT
GAUZE XEROFORM 5X9 LF (GAUZE/BANDAGES/DRESSINGS) ×1 IMPLANT
GLOVE BIO SURGEON STRL SZ7.5 (GLOVE) ×2 IMPLANT
GLOVE BIOGEL PI IND STRL 8 (GLOVE) ×1 IMPLANT
GLOVE BIOGEL PI INDICATOR 8 (GLOVE) ×1
GOWN STRL REUS W/ TWL LRG LVL3 (GOWN DISPOSABLE) ×1 IMPLANT
GOWN STRL REUS W/ TWL XL LVL3 (GOWN DISPOSABLE) ×1 IMPLANT
GOWN STRL REUS W/TWL LRG LVL3 (GOWN DISPOSABLE) ×2
GOWN STRL REUS W/TWL XL LVL3 (GOWN DISPOSABLE) ×2
KIT BASIN OR (CUSTOM PROCEDURE TRAY) ×2 IMPLANT
KIT TURNOVER KIT B (KITS) ×2 IMPLANT
NDL HYPO 25GX1X1/2 BEV (NEEDLE) ×1 IMPLANT
NEEDLE HYPO 25GX1X1/2 BEV (NEEDLE) IMPLANT
NS IRRIG 1000ML POUR BTL (IV SOLUTION) ×2 IMPLANT
PACK ORTHO EXTREMITY (CUSTOM PROCEDURE TRAY) ×2 IMPLANT
PAD ARMBOARD 7.5X6 YLW CONV (MISCELLANEOUS) ×2 IMPLANT
PAD CAST 4YDX4 CTTN HI CHSV (CAST SUPPLIES) IMPLANT
PADDING CAST COTTON 4X4 STRL (CAST SUPPLIES) ×10
SHIELD EYE LENSE ONLY DISP (GAUZE/BANDAGES/DRESSINGS) ×1 IMPLANT
SOL PREP POV-IOD 4OZ 10% (MISCELLANEOUS) ×2 IMPLANT
SPLINT FIBERGLASS 4X30 (CAST SUPPLIES) ×1 IMPLANT
SUT ETHILON 3 0 PS 1 (SUTURE) ×2 IMPLANT
SUT PROLENE 3 0 PS 1 (SUTURE) ×4 IMPLANT
SUT PROLENE 3 0 SH1 36 (SUTURE) ×1 IMPLANT
SUT PROLENE 4 0 SH DA (SUTURE) ×1 IMPLANT
SUT VICRYL 4-0 PS2 18IN ABS (SUTURE) ×1 IMPLANT
SYR CONTROL 10ML LL (SYRINGE) ×1 IMPLANT
TOWEL GREEN STERILE (TOWEL DISPOSABLE) ×2 IMPLANT
TUBE CONNECTING 12X1/4 (SUCTIONS) ×2 IMPLANT
YANKAUER SUCT BULB TIP NO VENT (SUCTIONS) ×2 IMPLANT

## 2019-02-11 NOTE — Brief Op Note (Signed)
02/11/2019  8:45 AM  PATIENT:  Christopher Roth  48 y.o. male  PRE-OPERATIVE DIAGNOSIS:  1. Right foot OSTEOMYOLITIS 2. Equinus   POST-OPERATIVE DIAGNOSIS:  1. Right foot OSTEOMYOLITIS 2. Equinus  PROCEDURE:  Procedure(s): AMPUTATION OF TRANSMETATARSAL OF RIGHT FOOT. (Right) With achilles tendon lengthening  SURGEON:  Surgeon(s) and Role:    * Felipa Furnace, DPM - Primary  PHYSICIAN ASSISTANT:   ASSISTANTS: none   ANESTHESIA:   general  EBL:  20 mL   BLOOD ADMINISTERED:none  DRAINS: none   LOCAL MEDICATIONS USED:  NONE  SPECIMEN:   ID Type Source Tests Collected by Time Destination  1 : Right forefoot Tissue PATH Amputaion Arm/Leg SURGICAL PATHOLOGY, AEROBIC/ANAEROBIC CULTURE (SURGICAL/DEEP WOUND) Felipa Furnace, DPM 02/11/2019 0768   2 : Right Clean Margin Tissue PATH Soft tissue SURGICAL PATHOLOGY Felipa Furnace, DPM 02/11/2019 0881   A : Right Clean margin (micro) Tissue Soft Tissue, Other AEROBIC/ANAEROBIC CULTURE (SURGICAL/DEEP WOUND) Felipa Furnace, DPM 02/11/2019 1031       DISPOSITION OF SPECIMEN:  Path and micro  COUNTS:  YES  TOURNIQUET:  * No tourniquets in log *  DICTATION: .Dragon Dictation  PLAN OF CARE: Admit for overnight observation  PATIENT DISPOSITION:  PACU - hemodynamically stable.   Delay start of Pharmacological VTE agent (>24hrs) due to surgical blood loss or risk of bleeding: no

## 2019-02-11 NOTE — Consult Note (Signed)
History and Physical   This is an H&P, not a consult note.   Christopher Roth  PYP:[id number]  DOB: 15-Mar-1971  DOA: 02/11/2019  PCP: Helane Rima, MD   Outpatient Specialists: Posey Pronto - podiatry; [facility] - ID; [facility] - vascular   Requesting physician: Posey Pronto  Reason for consultation: Transmetatarsal amputation.  Needs overnight observation to monitor for pain control and bleeding.  Needs 3 doses of Ancef over 24 hours.  History of Present Illness: Christopher Roth is an 48 y.o. male with h/o HTN; acute systolic CHF; DM; and diabetic foot ulcer presenting after TMA.  He reports developing a foot ulcer about 2 months ago, no known injury.  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  He did have angio which showed occlusion of the anterior tibial artery and peroneal artery which were treated with angioplasty on 9/15 with resolution of the stenosis.  He was started on Plavix for at least 3 months.  He reports that the surgery was uncomplicated and he is having mild pain.  He denies other symptoms.   Review of Systems:  ROS As per HPI otherwise 10 point review of systems negative.    Past Medical History: Past Medical History:  Diagnosis Date  . Acute systolic congestive heart failure ([place]) 04/22/2016  . 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 Diabetes mellitus with peripheral vascular disease (HCC)    Type II  . Diabetic foot ulcer ([place]) 01/31/2019  . GERD (gastroesophageal reflux disease)   . Hypertension   . PVD (peripheral vascular disease) ([place])   . Tobacco use disorder    intermittent cigar use now    Past Surgical History: Past Surgical History:  Procedure Laterality Date  . ABDOMINAL AORTOGRAM W/LOWER EXTREMITY Bilateral 01/18/2019   Procedure: ABDOMINAL AORTOGRAM W/LOWER EXTREMITY;  Surgeon: Serafina Mitchell, MD;  Location: [place] [facility];  Service: Cardiovascular;  Laterality: Bilateral;  . CARDIAC CATHETERIZATION N/A 04/23/2016   Procedure: Right/Left Heart Cath and Coronary Angiography;  Surgeon: Belva Crome, MD;  Location: [place] [facility];  Service: Cardiovascular;  Laterality: N/A;  . ICD IMPLANT N/A 01/27/2018   Procedure: ICD IMPLANT;  Surgeon: Deboraha Sprang, MD;  Location: [place] [facility];  Service: Cardiovascular;  Laterality: N/A;  . INCISION AND DRAINAGE PERIRECTAL ABSCESS Right 11/17/2014   Procedure: debridement of right perianal and groin wound;  Surgeon: Johnathan Hausen, MD;  Location: [facility];  Service: General;  Laterality: Right;  . PERIPHERAL VASCULAR BALLOON ANGIOPLASTY Right 01/18/2019   Procedure: PERIPHERAL VASCULAR BALLOON ANGIOPLASTY;  Surgeon: Serafina Mitchell, MD;  Location: [place] [facility];  Service: Cardiovascular;  Laterality: Right;  posterior tibial, peroneal     Allergies:   Allergies  Allergen Reactions  . Nickel Rash     Social History:  reports that he has been smoking cigars. He has a 24.00 pack-year smoking history. He has never used smokeless tobacco. He reports current alcohol use. He reports that he does not use drugs.   Family History: Family History  Problem Relation Age of Onset  . Diabetes Mellitus II Mother       Physical Exam: Vitals:   02/11/19 0918 02/11/19 0933 02/11/19 0948 02/11/19 1015  BP: 103/68 105/71 105/73 109/76  Pulse: 70 65 75 70  Resp: _0 Temp:    (!) 97.5 F (36.4 C)  TempSrc:    Oral  SpO2: 97% 96% 98% 98%  Weight:      Height:        Constitutional: Alert and awake, oriented x3, not in any acute distress. Eyes:EOMI, irises appear normal, anicteric sclera,  ENMT: external ears and nose appear normal, normal hearing, Lips appear normal, oropharynx mucosa appears normal  Neck: neck appears normal, no masses, normal ROM CVS: S1-S2 clear, no murmur rubs or gallops, no LE edema, normal  pedal pulses  Respiratory:  clear to auscultation bilaterally, no wheezing, rales or rhonchi. Respiratory effort normal. No accessory muscle use.  Abdomen: soft nontender, nondistended Musculoskeletal: : R foot s/p TMA and in dressing; normal LLE Neuro: Cranial nerves II-XII intact, normal strength Psych: judgement and insight appear normal, stable mood and affect, mental status Skin: no rashes or lesions or ulcers on limited exam   Data reviewed:  I have personally reviewed the recent labs and imaging studies  Pertinent Labs:   From 9/25: Glucose 138 BUN 24/Creatinine 1.40 WBC 8.4 Hgb 11.0 Platelets 430 ESR 45 A1c 7.7  From 10/7: COVID negative   Inpatient Medications:   Scheduled Meds: . [START ON 02/12/2019] aspirin EC  81 mg Oral Daily  . atorvastatin  80 mg Oral QPM  . carvedilol  6.25 mg Oral BID WC  . docusate sodium  100 mg Oral BID  . heparin  5,000 Units Subcutaneous Q8H  . HYDROmorphone      . insulin aspart  0-15 Units Subcutaneous TID WC  . insulin aspart  0-5 Units Subcutaneous QHS  . isosorbide-hydrALAZINE  2 tablet Oral TID  . sacubitril-valsartan  1 tablet Oral BID  . spironolactone  25 mg Oral QPM   Continuous Infusions: . ceFAZolin    .  ceFAZolin (ANCEF) IV       Radiological Exams on Admission: No results found.  Impression/Recommendations Principal Problem:   Status post transmetatarsal amputation of foot, right (HCC) Active Problems:   Essential hypertension   Diabetes mellitus type II, uncontrolled ([place])   Hyperlipidemia associated with type 2 diabetes mellitus (HCC)   PVD (peripheral vascular disease) (HCC)   Chronic systolic CHF (congestive heart failure) (HCC)   AICD (automatic cardioverter/defibrillator) present   PVD with diabetic foot ulcer, now s/p R TMA -Patient presented for R TMA due to non-healing ulcer and osteomyelitis -Angioplasty was completed last month with good results -Operative note and discussion with Dr.  Posey Pronto indicates uncomplicated procedure -Will monitor patient overnight with anticipated d/c to home tomorrow -Pain control with Norco/morphine -Ancef 1 gram TID x 3 doses -Resume ASA and Plavix tomorrow  Uncontrolled DM -Recent A1c was 7.7, indicating suboptimal but not terrible control -Hold PO medications including Farxiga, Glucophage, and Januvia -Will cover with moderate-scale SSI for now including qhs coverage  Chronic CHF with AICD -Continue Bidil, Coreg, Entresto, Aldactone -Hold Lasix, which he appears to be taking only prn -Has AICD in place  HTN -Continue Coreg, Entreso, Bidil  HLD -Continue high-dose Lipitor    NOTE: This is an H&P, not a consult note.    Note: This patient has been tested and was negative for the novel coronavirus COVID-19 on 10/7.  DVT prophylaxis: Heparin Code Status:  Full Family Communication: None present  Disposition Plan:  Home once clinically improved Consults called: Podiatry  Admission status: It is my clinical opinion that referral for OBSERVATION is reasonable and necessary in  this patient based on the above information provided. The aforementioned taken together are felt to place the patient at high risk for further clinical deterioration. However it is anticipated that the patient may be medically stable for discharge from the hospital within 24 to 48 hours.    Karmen Bongo MD [facility]   How to contact the [facility] Attending or Consulting provider [place] or covering provider during after hours [place], for this patient?  1. Check the care team in [facility] and look for a) attending/consulting [facility] provider listed and b) the [facility] team listed 2. Log into [email or web address] and use [place]'s universal password to access. If you do not have the password, please contact the hospital operator. 3. Locate the [facility] provider you are looking for under [facility] and page to a number that you can be directly reached. 4. If you still  have difficulty reaching the provider, please page the [facility] (Director on Call) for the Hospitalists listed on amion for assistance.   02/11/2019, 10:30 AM

## 2019-02-11 NOTE — Anesthesia Postprocedure Evaluation (Signed)
Anesthesia Post Note  Patient: Christopher Roth  Procedure(s) Performed: AMPUTATION OF TRANSMETATARSAL OF RIGHT FOOT. (Right Toe)     Patient location during evaluation: PACU Anesthesia Type: General Level of consciousness: awake and alert Pain management: pain level controlled Vital Signs Assessment: post-procedure vital signs reviewed and stable Respiratory status: spontaneous breathing, nonlabored ventilation and respiratory function stable Cardiovascular status: blood pressure returned to baseline and stable Postop Assessment: no apparent nausea or vomiting Anesthetic complications: no    Last Vitals:  Vitals:   02/11/19 0948 02/11/19 1015  BP: 105/73 109/76  Pulse: 75 70  Resp: 14 18  Temp:  (!) 36.4 C  SpO2: 98% 98%    Last Pain:  Vitals:   02/11/19 1015  TempSrc: Oral  PainSc:                  Lynda Rainwater

## 2019-02-11 NOTE — Plan of Care (Signed)
  Problem: Education: Goal: Knowledge of General Education information will improve Description Including pain rating scale, medication(s)/side effects and non-pharmacologic comfort measures Outcome: Progressing   

## 2019-02-11 NOTE — Plan of Care (Signed)
  Problem: Education: Goal: Knowledge of General Education information will improve Description: Including pain rating scale, medication(s)/side effects and non-pharmacologic comfort measures Outcome: Progressing   Problem: Health Behavior/Discharge Planning: Goal: Ability to manage health-related needs will improve Outcome: Progressing   Problem: Clinical Measurements: Goal: Ability to maintain clinical measurements within normal limits will improve Outcome: Progressing   Problem: Nutrition: Goal: Adequate nutrition will be maintained Outcome: Progressing   Problem: Coping: Goal: Level of anxiety will decrease Outcome: Progressing   Problem: Pain Managment: Goal: General experience of comfort will improve Outcome: Progressing

## 2019-02-11 NOTE — Anesthesia Procedure Notes (Signed)
Procedure Name: LMA Insertion Date/Time: 02/11/2019 7:25 AM Performed by: Jenne Campus, CRNA Pre-anesthesia Checklist: Patient identified, Emergency Drugs available, Suction available and Patient being monitored Patient Re-evaluated:Patient Re-evaluated prior to induction Oxygen Delivery Method: Circle System Utilized Preoxygenation: Pre-oxygenation with 100% oxygen Induction Type: IV induction Ventilation: Mask ventilation without difficulty LMA: LMA inserted LMA Size: 4.0 Number of attempts: 1 Airway Equipment and Method: Bite block Placement Confirmation: positive ETCO2 Tube secured with: Tape Dental Injury: Teeth and Oropharynx as per pre-operative assessment

## 2019-02-11 NOTE — Progress Notes (Addendum)
Subjective:  Patient ID: Christopher Roth, male    DOB: [birth year],  MRN: [id number]   Preop Note  A 48 y.o. 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  He was under care of Dr. Jacqualyn Posey . I had the pleasure meeting him today.  He had a CT scan that was recently done that showed osteomyelitis of the first and fifth metatarsal head.  Has any nausea fever chills vomiting shortness of breath. 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 Resolution of the stenosis was achieved. There was a atherosclerotic plaque in the distal anterior tibial artery above the ankle that required additional treatment. This area will need to be monitored on subsequent follow-up ultrasounds. 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 This was treated with 3 mm balloon angioplasty with resolution of the stenosis. #3 No significant inflow or outflow stenosis bilaterally #4 The patient will be started on Plavix today for a minimum of 3 months  Objective:   Vitals:   02/11/19 0601  BP: 110/67  Pulse: 75  Resp: 17  Temp: 98.5 F (36.9 C)  SpO2: 99%   General AA&O x3. Normal mood and affect.  Vascular Dorsalis pedis and posterior tibial pulses 2/4 bilat. Brisk capillary refill to all digits. Pedal hair present.  Neurologic Epicritic sensation grossly intact.  Dermatologic  wound number one 1 cm x 1 cm at the right first medial aspect metatarsal head probes down to bone with concern for underlying osteomyelitis.  No clinical signs of infection noted including purulent drainage malodor or cellulitis. Wound #2 0.5 cm x 0.5 cm at the lateral aspect of the fifth metatarsal head probing down to bone.  No clinical signs of infection noted  including purulent drainage malodor or cellulitis.  Orthopedic:  Decrease in dorsiflexion at the ankle joint.  Positive Silverskiold test.  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    Assessment & Plan:  Patient was evaluated and treated and all questions answered.  [name] is a 48 year old male who presented today with right first and fifth metatarsal osteomyelitis.  Per CT scan there is concern for acute osteomyelitis of the first and fifth metatarsal head.  At this time I recommended right foot transmetatarsal amputation.  Explained to the patient that losing the first ray as well as the fifth ray can provide instability to the foot that leads to further breakdown of the remaining 3 metatarsal.  Therefore it is in his best interest to undergo transmetatarsal amputation.  He also had ankle equinus present that after losing the forefoot can provide further instability to the biomechanics of the foot that could lead to future ulceration therefore I recommended Achilles tendon lengthening.  Patient agreed to the above procedure. Informed surgical risk consent was reviewed and read aloud to the patient.  I reviewed the films.  I have discussed my findings with the patient in great detail.  I have discussed all risks including but not limited to infection, stiffness, scarring, limp, disability, deformity, damage to blood vessels and nerves, numbness, poor healing, need for braces, arthritis, chronic pain, amputation, death.  All benefits and realistic expectations discussed in great detail.  I have made no promises as to the outcome.  I have provided realistic expectations.  I have offered the patient a 2nd  opinion, which they have declined and assured me they preferred to proceed despite the risks  -Postsurgically I will place the patient in a posterior splint NWB RLE and he will follow-up with me in clinic from 1 week from from discharge.   Felipa Furnace, DPM  Accessible via secure chat for  questions or concerns.

## 2019-02-11 NOTE — Interval H&P Note (Signed)
History and Physical Interval Note:  02/11/2019 6:57 AM  Christopher Roth  has presented today for surgery, with the diagnosis of OSTEOMYOLITIS.  The various methods of treatment have been discussed with the patient and family. After consideration of risks, benefits and other options for treatment, the patient has consented to  Procedure(s): AMPUTATION OF TRANSMETATARSAL OF RIGHT FOOT. (Right) as a surgical intervention.  The patient's history has been reviewed, patient examined, no change in status, stable for surgery.  I have reviewed the patient's chart and labs.  Questions were answered to the patient's satisfaction.     Felipa Furnace

## 2019-02-11 NOTE — Transfer of Care (Signed)
Immediate Anesthesia Transfer of Care Note  Patient: Christopher Roth  Procedure(s) Performed: AMPUTATION OF TRANSMETATARSAL OF RIGHT FOOT. (Right Toe)  Patient Location: PACU  Anesthesia Type:General  Level of Consciousness: awake, oriented and patient cooperative  Airway & Oxygen Therapy: Patient Spontanous Breathing and Patient connected to nasal cannula oxygen  Post-op Assessment: Report given to RN and Post -op Vital signs reviewed and stable  Post vital signs: Reviewed  Last Vitals:  Vitals Value Taken Time  BP 111/70 02/11/19 0849  Temp    Pulse 78 02/11/19 0850  Resp 16 02/11/19 0850  SpO2 97 % 02/11/19 0850  Vitals shown include unvalidated device data.  Last Pain:  Vitals:   02/11/19 0557  PainSc: 0-No pain      Patients Stated Pain Goal: 3 (19/41/74 0814)  Complications: No apparent anesthesia complications

## 2019-02-11 NOTE — H&P (View-Only) (Signed)
Subjective:  Patient ID: Christopher Roth, male    DOB: 08-05-1970,  MRN: [id number]   Preop Note  A 48 y.o. 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  He was under care of Dr. Jacqualyn Posey . I had the pleasure meeting him today.  He had a CT scan that was recently done that showed osteomyelitis of the first and fifth metatarsal head.  Has any nausea fever chills vomiting shortness of breath. 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 Resolution of the stenosis was achieved. There was a atherosclerotic plaque in the distal anterior tibial artery above the ankle that required additional treatment. This area will need to be monitored on subsequent follow-up ultrasounds. 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 This was treated with 3 mm balloon angioplasty with resolution of the stenosis. #3 No significant inflow or outflow stenosis bilaterally #4 The patient will be started on Plavix today for a minimum of 3 months  Objective:   Vitals:   02/11/19 0601  BP: 110/67  Pulse: 75  Resp: 17  Temp: 98.5 F (36.9 C)  SpO2: 99%   General AA&O x3. Normal mood and affect.  Vascular Dorsalis pedis and posterior tibial pulses 2/4 bilat. Brisk capillary refill to all digits. Pedal hair present.  Neurologic Epicritic sensation grossly intact.  Dermatologic  wound number one 1 cm x 1 cm at the right first medial aspect metatarsal head probes down to bone with concern for underlying osteomyelitis.  No clinical signs of infection noted including purulent drainage malodor or cellulitis. Wound #2 0.5 cm x 0.5 cm at the lateral aspect of the fifth metatarsal head probing down to bone.  No clinical signs of infection noted  including purulent drainage malodor or cellulitis.  Orthopedic:  Decrease in dorsiflexion at the ankle joint.  Positive Silverskiold test.  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    Assessment & Plan:  Patient was evaluated and treated and all questions answered.  Christopher Roth is a 48 year old male who presented today with right first and fifth metatarsal osteomyelitis.  Per CT scan there is concern for acute osteomyelitis of the first and fifth metatarsal head.  At this time I recommended right foot transmetatarsal amputation.  Explained to the patient that losing the first ray as well as the fifth ray can provide instability to the foot that leads to further breakdown of the remaining 3 metatarsal.  Therefore it is in his best interest to undergo transmetatarsal amputation.  He also had ankle equinus present that after losing the forefoot can provide further instability to the biomechanics of the foot that could lead to future ulceration therefore I recommended Achilles tendon lengthening.  Patient agreed to the above procedure. Informed surgical risk consent was reviewed and read aloud to the patient.  I reviewed the films.  I have discussed my findings with the patient in great detail.  I have discussed all risks including but not limited to infection, stiffness, scarring, limp, disability, deformity, damage to blood vessels and nerves, numbness, poor healing, need for braces, arthritis, chronic pain, amputation, death.  All benefits and realistic expectations discussed in great detail.  I have made no promises as to the outcome.  I have provided realistic expectations.  I have offered the patient a 2nd  opinion, which they have declined and assured me they preferred to proceed despite the risks  -Postsurgically I will place the patient in a posterior splint NWB RLE and he will follow-up with me in clinic from 1 week from from discharge.   Christopher Roth, DPM  Accessible via secure chat for  questions or concerns.

## 2019-02-12 ENCOUNTER — Encounter (HOSPITAL_COMMUNITY): Payer: Self-pay | Admitting: Podiatry

## 2019-02-12 DIAGNOSIS — E1165 Type 2 diabetes mellitus with hyperglycemia: Secondary | ICD-10-CM

## 2019-02-12 DIAGNOSIS — Z89431 Acquired absence of right foot: Secondary | ICD-10-CM | POA: Diagnosis not present

## 2019-02-12 DIAGNOSIS — E1169 Type 2 diabetes mellitus with other specified complication: Secondary | ICD-10-CM | POA: Diagnosis not present

## 2019-02-12 LAB — CBC
HCT: 25.2 % — ABNORMAL LOW (ref 39.0–52.0)
Hemoglobin: 8.7 g/dL — ABNORMAL LOW (ref 13.0–17.0)
MCH: 29 pg (ref 26.0–34.0)
MCHC: 34.5 g/dL (ref 30.0–36.0)
MCV: 84 fL (ref 80.0–100.0)
Platelets: 276 10*3/uL (ref 150–400)
RBC: 3 MIL/uL — ABNORMAL LOW (ref 4.22–5.81)
RDW: 16.5 % — ABNORMAL HIGH (ref 11.5–15.5)
WBC: 9.8 10*3/uL (ref 4.0–10.5)
nRBC: 0 % (ref 0.0–0.2)

## 2019-02-12 LAB — GLUCOSE, CAPILLARY
Glucose-Capillary: 130 mg/dL — ABNORMAL HIGH (ref 70–99)
Glucose-Capillary: 137 mg/dL — ABNORMAL HIGH (ref 70–99)

## 2019-02-12 MED ORDER — HYDROCODONE-ACETAMINOPHEN 5-325 MG PO TABS: 1.0000 | ORAL_TABLET | Freq: Two times a day (BID) | ORAL | 0 refills | Status: AC | PRN

## 2019-02-12 MED ORDER — DOCUSATE SODIUM 100 MG PO CAPS: 100.0000 mg | ORAL_CAPSULE | Freq: Two times a day (BID) | ORAL | 0 refills | Status: DC

## 2019-02-12 NOTE — TOC Transition Note (Signed)
Transition of Care [facility]) - CM/SW Discharge Note   Patient Details  Name: Christopher Roth MRN: [id number] Date of Birth: 10-08-1970  Transition of [facility]) CM/SW Contact:  Bartholomew Crews, RN Phone Number: [phone] 02/12/2019, 5:10 PM   Clinical Narrative:    Received call from nurse that patient is discharging but needs RW. Notified [employer] but unable to deliver today. [employer] to deliver to [facility] tomorrow and patient or family member to pick up. Advised by [employer] that nursing may sign for the walker.    Final next level of care: Home/Self Care Barriers to Discharge: No Barriers Identified   Patient Goals and CMS Choice        Discharge Placement                       Discharge Plan and Services                DME Arranged: Walker rolling DME Agency: [employer] Date DME Agency Contacted: 02/12/19 Time DME Agency Contacted: 1517 Representative spoke with at DME Agency: [place]: NA [place] Agency: NA        Social Determinants of Health ([place]) Interventions     Readmission Risk Interventions No flowsheet data found.

## 2019-02-12 NOTE — Op Note (Signed)
Surgeon: Boneta Lucks, DPM Assistants:  Pre-operative diagnosis: 1. Right foot osteomyelitis  2. Right foot equinus Post-operative diagnosis: same Procedure: 1. Right foot transmetatarsal amputation with primary closure 2. Right foot achilles tendon lengthening  Pathology:  ID Type Source Tests Collected by Time Destination  1 : Right forefoot Tissue PATH Amputaion Arm/Leg SURGICAL PATHOLOGY, AEROBIC/ANAEROBIC CULTURE (SURGICAL/DEEP WOUND) Felipa Furnace, DPM 02/11/2019 2355   2 : Right Clean Margin Tissue PATH Soft tissue SURGICAL PATHOLOGY Felipa Furnace, DPM 02/11/2019 7322   A : Right Clean margin (micro) Tissue Soft Tissue, Other AEROBIC/ANAEROBIC CULTURE (SURGICAL/DEEP WOUND) Felipa Furnace, DPM 02/11/2019 0254     Pertinent Intra-op findings: Right foot friable and soft bone noted at the first and fifth metatarsal head consistent with osteomyelitis  Anesthesia: LMA Hemostasis: Anatomic dissection EBL: 100 cc Materials: 3-0 vicryl and 3-0 prolene Injectables: None Complications: None  Indications for surgery: A 48 y.o. male presents with Right for osteomyelitis of the first metatarsal head and fifth metatarsal head.  CT scan showed that there is cortical irregularity at both of the metatarsal head as described above.  At this time I recommended that patient would benefit from a transmetatarsal amputation with primary closure.  He also had a positive Silverskiold test which confirmed equinus.  I recommended that he would benefit from an Achilles tendon lengthening with the amputation.  This will help decrease plantar forefoot pressure and thereby preventing future ulcerations. Informed surgical risk consent was reviewed and read aloud to the patient.  I reviewed the films.  I have discussed my findings with the patient in great detail.  I have discussed all risks including but not limited to infection, stiffness, scarring, limp, disability, deformity, damage to blood vessels and nerves,  numbness, poor healing, need for braces, arthritis, chronic pain, amputation, death.  All benefits and realistic expectations discussed in great detail.  I have made no promises as to the outcome.  I have provided realistic expectations.  I have offered the patient a 2nd opinion, which they have declined and assured me they preferred to proceed despite the risks    Procedure in detail: The patient was both verbally and visually identified by myself, the nursing staff, and anesthesia staff in the preoperative holding area. They were then transferred to the operating room and placed on the operative table in supine position.  Attention was then directed to the right posterior leg to perform the percutaneous Achilles tendon lengthening. The tendon was identified via palpation. A stab incision was made at the distal aspect of the tendon using a #15 blade starting at the central aspect of the tendon parallel to the tendon fibers and rotated medially to transect the medial tendon fibers. A second stab incision was made two fingerbreadths proximal to the first by inserting the blade centrally parallel to the tendon and rotating the blade perpendicular laterally to transect the lateral tendon fibers. A third stab incision was then made around two fingers breadth proximal to the second stab incision with the #11 blade, once again starting centrally parallel to the tendon fibers and rotated perpendicular medially to hemi-sect the medial tendon fibers. The foot was then dorsiflexed and a notable reduction in equinus deformity observed. The stab incisions were then closed with 3-0 Prolene suture using simple and horizontal interrupted suture technique and the area dressed with dry sterile gauze and adhesive dressing. It should be noted that the forefoot remained covered during the entirety of the tendon lengthening. A blue towel was placed  over the hindfoot for the remainder of the procedure.   Attention was then  directed to the right forefoot.  A skin marker was used to mark the line of incision circumferentially around the forefoot.  A fish mouth shaped incision was made using a #15 blade at mid-metatarsal level to ensure excision of all non-viable tissue.  The incision was then deepened down to bone using layered dissection, first sharply with #15, then with bovie.  Care was taken to ensure that all vital neurovascular structures were carefully and safely retracted.  All bleeders were ligated with prolene suture or bovied as needed.  A key elevator was used to free any soft tissue attachments from each metatarsal.  With each metatarsal adequately visualized a sagittal saw was used to resect each of the metatarsals one to five.   The forefoot was then disarticulated using a #15 blade and passed off the table in the normal sterile manner to be sent for pathology.  All visible tendons were resected as far proximal as possible.  The surgical site was then pulse lavaged using three liters NSS.  Dirty gloves were removed and passed off the table. New sterile gloves were applied after lavage was completed.  No purulence was noted to the proximal foot. Clean margins were taken and sent to pathology and micro. Good bleeding was noted during the case.  Closure of the transmetatarsal incision was completed 3-0 Prolene.  The right foot was then dressed with dry sterile 4x8 gauze, xeroform, kling, kerlix, ACE wrap, and Webril cast padding, ensuring all bony prominences were adequately padded.  A posterior splint was than fashioned and applied and secured with ACE wrap to ensure proper positioning of the ankle at ninety degrees after the tendon lengthening procedure  At the conclusion of the procedure the patient was awoken from anesthesia and found to have tolerated the procedure well any complications. There were transferred to PACU with vital signs stable and vascular status intact.  Boneta Lucks, DPM

## 2019-02-12 NOTE — Progress Notes (Signed)
  Subjective:  Patient ID: Jerard Bays, male    DOB: 1970-08-04,  MRN: [id number]  A 48 y.o. male presents with Right TMA with TAL POD #1. He is doing well. Pain is controlled. No overnight events. He denies any N/F/C/V/SOB/CP.   Objective:   Vitals:   02/12/19 0726 02/12/19 0829  BP: 122/80   Pulse: 60 60  Resp: 17   Temp: 98.1 F (36.7 C)   SpO2: 98%    General AA&O x3. Normal mood and affect.  Vascular Full Exam deferred as bandage is left intact until office visit.  Neurologic Full Exam deferred as bandage is left intact until office visit.  Dermatologic Full Exam deferred as bandage is left intact until office visit.  Orthopedic: Full Exam deferred as bandage is left intact until office visit.    Assessment & Plan:  Patient was evaluated and treated and all questions answered.  Mr. Juanda Crumble is a 48 year old male status post right TMA with TAL postop day 1.  -All questions and concerns were addressed and answered. -He is okay to be discharged from podiatric standpoint. -He will not need any antibiotics upon discharge as surgical cure was achieved unless if clean margin cultures come back positive.  If they are positive I will place him on antibiotics when I see him in clinic. -He will need to be nonweightbearing to the right lower extremity.  Physical therapy was consulted. -He was explained the importance of maintaining nonweightbearing status to the right lower extremity and if not done so properly the wound may dehisce and can lead to infection and loss of limb. -Pain control per primary team.  He had mentioned to me that he would like some pain medication on discharge. -No dressing changes until follow-up. -I will see him again in office on Friday at 2:20 PM.   Felipa Furnace, DPM  Accessible via secure chat for questions or concerns.

## 2019-02-12 NOTE — Plan of Care (Signed)
  Problem: Pain Managment: Goal: General experience of comfort will improve Outcome: Progressing   

## 2019-02-12 NOTE — Evaluation (Signed)
Physical Therapy Evaluation & Discharge Patient Details Name: Christopher Roth MRN: [id number] DOB: [birth year] Today's Date: 02/12/2019   History of Present Illness  Pt is a 48 y/o male s/p R transmetatarsal amputation. PMH including but not limited to HTN, DM, CHF, AICD.  Clinical Impression  Pt presented sitting EOB, awake and willing to participate in therapy session. Pt's wife present throughout. Prior to admission, pt reported that he was independent with all functional mobility and ADLs. Pt lives with his wife in a two level home with one step to enter. At the time of evaluation, pt at min guard to supervision level for all functional mobility including stair navigation. Pt able to maintain NWB R LE throughout independently. PT answered all of pt/family's questions at end of session. No further acute PT needs identified at this time. PT signing off.     Follow Up Recommendations No PT follow up    Equipment Recommendations  Rolling walker with 5" wheels    Recommendations for Other Services       Precautions / Restrictions Precautions Precautions: Fall Restrictions Weight Bearing Restrictions: Yes Other Position/Activity Restrictions: NWB R LE      Mobility  Bed Mobility               General bed mobility comments: pt sitting EOB upon arrival  Transfers Overall transfer level: Needs assistance Equipment used: Rolling walker (2 wheeled) Transfers: Sit to/from Stand Sit to Stand: Supervision         General transfer comment: demonstration and cueing for technique, supervision for safety  Ambulation/Gait Ambulation/Gait assistance: Min guard Gait Distance (Feet): 75 Feet Assistive device: Rolling walker (2 wheeled) Gait Pattern/deviations: (hop-to on L LE) Gait velocity: decreased   General Gait Details: pt overall steady with RW, no LOB or need for physical assistance, min guard for safety  Stairs Stairs: Yes Stairs assistance: Supervision Stair  Management: No rails;Step to pattern;Backwards;With walker Number of Stairs: 1 General stair comments: cueing and instruction for technique, supervision for safety  Wheelchair Mobility    Modified Rankin (Stroke Patients Only)       Balance Overall balance assessment: Needs assistance Sitting-balance support: No upper extremity supported Sitting balance-Leahy Scale: Good     Standing balance support: Single extremity supported;Bilateral upper extremity supported Standing balance-Leahy Scale: Poor Standing balance comment: reliant on UE support                             Pertinent Vitals/Pain Pain Assessment: 0-10 Pain Score: 2  Pain Location: R foot Pain Descriptors / Indicators: Throbbing Pain Intervention(s): Monitored during session;Repositioned    Home Living Family/patient expects to be discharged to:: Private residence Living Arrangements: Spouse/significant other Available Help at Discharge: Family;Available 24 hours/day Type of Home: House Home Access: Stairs to enter Entrance Stairs-Rails: None Entrance Stairs-Number of Steps: 1 Home Layout: Two level Home Equipment: Crutches      Prior Function Level of Independence: Independent               Hand Dominance        Extremity/Trunk Assessment   Upper Extremity Assessment Upper Extremity Assessment: Overall WFL for tasks assessed    Lower Extremity Assessment Lower Extremity Assessment: Overall WFL for tasks assessed;RLE deficits/detail RLE Deficits / Details: pt with dressing donned to R foot and lower leg; able to maintain NWB'ing throughout independently     Cervical / Trunk Assessment Cervical / Trunk Assessment: Normal  Communication  Communication: No difficulties  Cognition Arousal/Alertness: Awake/alert Behavior During Therapy: WFL for tasks assessed/performed Overall Cognitive Status: Within Functional Limits for tasks assessed                                         General Comments      Exercises     Assessment/Plan    PT Assessment Patent does not need any further PT services  PT Problem List         PT Treatment Interventions      PT Goals (Current goals can be found in the Care Plan section)  Acute Rehab PT Goals Patient Stated Goal: "home today" PT Goal Formulation: All assessment and education complete, DC therapy    Frequency     Barriers to discharge        Co-evaluation               AM-PAC PT "6 Clicks" Mobility  Outcome Measure Help needed turning from your back to your side while in a flat bed without using bedrails?: None Help needed moving from lying on your back to sitting on the side of a flat bed without using bedrails?: None Help needed moving to and from a bed to a chair (including a wheelchair)?: None Help needed standing up from a chair using your arms (e.g., wheelchair or bedside chair)?: None Help needed to walk in hospital room?: None Help needed climbing 3-5 steps with a railing? : A Little 6 Click Score: 23    End of Session Equipment Utilized During Treatment: Gait belt Activity Tolerance: Patient tolerated treatment well Patient left: in bed;with call bell/phone within reach;with family/visitor present;Other (comment)(sitting EOB) Nurse Communication: Mobility status PT Visit Diagnosis: Other abnormalities of gait and mobility (R26.89)    Time: 2446-9507 PT Time Calculation (min) (ACUTE ONLY): 20 min   Charges:   PT Evaluation $PT Eval Moderate Complexity: 1 Mod          Sherie Don, PT, DPT  Acute Rehabilitation Services Pager [phone] Office [place] 02/12/2019, 2:27 PM

## 2019-02-12 NOTE — Discharge Summary (Signed)
Physician Discharge Summary  Leo Fray QVZ:[id number] DOB: 1971/04/08 DOA: 02/11/2019  PCP: Helane Rima, MD  Admit date: 02/11/2019 Discharge date: 02/12/2019  Admitted From: Home.  Disposition:  Home.   Recommendations for Outpatient Follow-up:  1. Follow up with PCP in 1-2 weeks 2. Please obtain BMP/CBC in one week Please follow up with podiatry as recommended.   Discharge Condition: stable.  CODE STATUS: full code.  Diet recommendation: Heart Healthy   Brief/Interim Summary: An Schnabel is an 48 y.o. male with h/o HTN; acute systolic CHF; DM; and diabetic foot ulcer presenting after TMA, . 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  He did have angio which showed occlusion of the anterior tibial artery and peroneal artery which were treated with angioplasty on 9/15 with resolution of the stenosis.  He was started on Plavix for at least 3 months he underwent transmetatarsal amputation , was on our service for overnight observation . PT worked and recommended no PT follow up .   Discharge Diagnoses:  Principal Problem:   Status post transmetatarsal amputation of foot, right (HCC) Active Problems:   Essential hypertension   Diabetes mellitus type II, uncontrolled ([place])   Hyperlipidemia associated with type 2 diabetes mellitus ([place])   PVD (peripheral vascular disease) ([place])   Chronic systolic CHF (congestive heart failure) ([place])   AICD (automatic cardioverter/defibrillator) present  PVD with diabetic foot ulcer, now s/p R TMA -Patient presented for R TMA due to non-healing ulcer and osteomyelitis -Angioplasty was completed last month with good results. Overnight pt did not have any complaints, but requesting pain medication on discharge.  - podiatry cleared him for discharge.   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  His last echo was in 12/2017 showed The estimated ejection fraction was in the range of 25%  to 30%. Diffuse hypokinesis. Doppler parameters are consistent   with a reversible restrictive pattern, indicative of decreased left ventricular diastolic compliance and/or increased left  atrial pressure (grade 3 diastolic dysfunction). Doppler parameters are consistent with high ventricular filling pressure.  -Continue Bidil, Coreg, Entresto, Aldactone and lasix.  -Has AICD in place  HTN -Continue Coreg, Entreso, Bidil  HLD -Continue high-dose Lipitor   Discharge Instructions  Discharge Instructions    Diet - low sodium heart healthy   Complete by: As directed    Discharge instructions   Complete by: As directed    Please follow up with podiatry as recommended.     Allergies as of 02/12/2019      Reactions   Nickel Rash      Medication List    TAKE these medications   albuterol 108 (90 Base) MCG/ACT inhaler Commonly known as: VENTOLIN HFA Inhale 2 puffs into the lungs every 6 (six) hours as needed for wheezing or shortness of breath.   Alcohol Swabs Pads 1 Package by Does not apply route as needed (with blood glucose checks).   aspirin EC 81 MG tablet Take 81 mg by mouth daily.   atorvastatin 80 MG tablet Commonly known as: LIPITOR Take 1 tablet (80 mg total) by mouth every evening.   BiDil 20-37.5 MG tablet Generic drug: isosorbide-hydrALAZINE Take 2 tablets by mouth 3 (three) times daily.   carvedilol 6.25 MG tablet Commonly  known as: COREG Take 1 tablet (6.25 mg total) by mouth 2 (two) times daily with a meal.   clopidogrel 75 MG tablet Commonly known as: Plavix Take 1 tablet (75 mg total) by mouth daily.   CoQ10 100 MG Caps Take 100 mg by mouth daily.   dapagliflozin propanediol 5 MG Tabs tablet Commonly known as:  FARXIGA Take 5 mg by mouth daily.   docusate sodium 100 MG capsule Commonly known as: COLACE Take 1 capsule (100 mg total) by mouth 2 (two) times daily.   Entresto 97-103 MG Generic drug: sacubitril-valsartan Take 1 tablet by mouth 2 (two) times daily.   freestyle lancets Use as instructed   furosemide 20 MG tablet Commonly known as: LASIX Take 1 tablet (20 mg total) by mouth as needed for fluid. What changed: when to take this   glucose blood test strip Commonly known as: Choice DM Fora G20 Test Strips Use as instructed   glucose monitoring kit monitoring kit 1 each by Does not apply route as needed for other.   HYDROcodone-acetaminophen 5-325 MG tablet Commonly known as: NORCO/VICODIN Take 1 tablet by mouth every 12 (twelve) hours as needed for up to 3 days for moderate pain.   metFORMIN 1000 MG tablet Commonly known as: GLUCOPHAGE Take 1,000 mg by mouth 2 (two) times daily with a meal.   povidone-Iodine 5 % Soln topical solution Commonly known as: BETADINE Apply 1 application topically 3 (three) times daily as needed for wound care (foot wound).   Santyl ointment Generic drug: collagenase Apply 1 application topically daily.   sitaGLIPtin 100 MG tablet Commonly known as: JANUVIA Take 100 mg by mouth every evening.   sodium chloride irrigation 0.9 % irrigation Irrigate with 1 application as directed 4 (four) times daily as needed (wound care).   spironolactone 25 MG tablet Commonly known as: ALDACTONE Take 1 tablet by mouth once daily What changed: when to take this      Follow-up Information    Helane Rima, MD. Schedule an appointment as soon as possible for a visit in 1 week(s).   Specialty: Family Medicine Contact information: [place] [address] [phone]          Allergies  Allergen Reactions  . Nickel Rash    Consultations:  Podiatry.    Procedures/Studies: Ct Foot Right W  Contrast  Addendum Date: 02/09/2019   ADDENDUM REPORT: 02/09/2019 16:51 ADDENDUM: The examination was discussed with Dr.Wagoner on 02/09/2019 at 1646 hours. Dr. Jacqualyn Posey states that there is a soft tissue wound along the lateral aspect of the fifth metatarsal head. In light of this history the cortical irregularity and small erosion along the plantar lateral fifth metatarsal head is most consistent with acute osteomyelitis rather than chronic changes. Electronically Signed   By: Kathreen Devoid   On: 02/09/2019 16:51   Result Date: 02/09/2019 CLINICAL DATA:  Right foot pain, ulceration. EXAM: CT OF THE LOWER RIGHT EXTREMITY WITH CONTRAST TECHNIQUE: Multidetector CT imaging of the lower right extremity was performed according to the standard protocol following intravenous contrast administration. COMPARISON:  None. CONTRAST:  130m ISOVUE-300 IOPAMIDOL (ISOVUE-300) INJECTION 61% FINDINGS: Bones/Joint/Cartilage Soft tissue ulceration along the plantar medial aspect of the great toe. Cortical destruction of the medial margin of the medial hallux sesamoid and to lesser extent the medial aspect of first metatarsal head most concerning for osteomyelitis. Small first MTP joint effusion. No acute fracture or dislocation. Normal alignment. No joint effusion. Ligaments Ligaments are  suboptimally evaluated by CT. Muscles and Tendons Muscles are normal. No muscle atrophy. No intramuscular fluid collection. Flexor, extensor, peroneal and Achilles tendons are grossly intact. Soft tissue No fluid collection or hematoma.  No soft tissue mass. IMPRESSION: 1. Soft tissue ulceration along the plantar medial aspect of the great toe. Cortical destruction of the medial margin of the medial hallux sesamoid and to lesser extent the medial aspect of first metatarsal head most concerning for osteomyelitis. Electronically Signed: By: Kathreen Devoid On: 02/07/2019 10:32   Dg Foot 2 Views Right  Result Date: 02/12/2019 CLINICAL DATA:   Transmetatarsal amputation of right foot EXAM: RIGHT FOOT - 2 VIEW COMPARISON:  Right foot radiograph 01/28/2019 FINDINGS: Status post right foot transmetatarsal amputation. There is bandaging material at the amputation site. Expected postoperative appearance of the bones. Mild vascular calcification. IMPRESSION: Expected postoperative appearance status post transmetatarsal amputation. Electronically Signed   By: Ulyses Jarred M.D.   On: 02/12/2019 00:17   Dg Foot Complete Right  Result Date: 01/28/2019 Please see detailed radiograph report in office note.  Vas [place] Burnard Bunting With/wo Tbi  Result Date: 01/14/2019 LOWER EXTREMITY DOPPLER STUDY Indications: Ulceration right foot x 2 months, and peripheral artery disease. High Risk         Hypertension, hyperlipidemia, Diabetes, past history of Factors:          smoking.  Comparison Study: No prior exam Performing Technologist: Alvia Grove RVT  Examination Guidelines: A complete evaluation includes at minimum, Doppler waveform signals and systolic blood pressure reading at the level of bilateral brachial, anterior tibial, and posterior tibial arteries, when vessel segments are accessible. Bilateral testing is considered an integral part of a complete examination. Photoelectric Plethysmograph (PPG) waveforms and toe systolic pressure readings are included as required and additional duplex testing as needed. Limited examinations for reoccurring indications may be performed as noted.  ABI Findings: +---------+------------------+-----+----------+--------+ Right    Rt Pressure (mmHg)IndexWaveform  Comment  +---------+------------------+-----+----------+--------+ Brachial 139                                       +---------+------------------+-----+----------+--------+ PTA      103               0.74 monophasic         +---------+------------------+-----+----------+--------+ DP       95                0.68 monophasic          +---------+------------------+-----+----------+--------+ Great Toe43                0.31 Abnormal           +---------+------------------+-----+----------+--------+ +---------+------------------+-----+----------+-------+ Left     Lt Pressure (mmHg)IndexWaveform  Comment +---------+------------------+-----+----------+-------+ Brachial 139                                      +---------+------------------+-----+----------+-------+ PTA      169               1.22 biphasic          +---------+------------------+-----+----------+-------+ DP       175               1.26 monophasic        +---------+------------------+-----+----------+-------+ [place]  0.64 Abnormal          +---------+------------------+-----+----------+-------+ +-------+-----------+-----------+------------+------------+ ABI/TBIToday's ABIToday's TBIPrevious ABIPrevious TBI +-------+-----------+-----------+------------+------------+ Right  0.74       0.31                                +-------+-----------+-----------+------------+------------+ Left   1.26       0.64                                +-------+-----------+-----------+------------+------------+  Summary: Right: Resting right ankle-brachial index indicates moderate right lower extremity arterial disease. The right toe-brachial index is abnormal. Left: Resting left ankle-brachial index is within normal range, however the left DPA is monophasic.. The left toe-brachial index is abnormal.  *See table(s) above for measurements and observations.  Electronically signed by Servando Snare MD on 01/14/2019 at 9:16:16 AM.    Final        Subjective: No chest pain or sob.   Discharge Exam: Vitals:   02/12/19 0726 02/12/19 0829  BP: 122/80   Pulse: 60 60  Resp: 17   Temp: 98.1 F (36.7 C)   SpO2: 98%    Vitals:   02/11/19 2314 02/12/19 0341 02/12/19 0726 02/12/19 0829  BP: 123/76 117/76 122/80   Pulse: 60 62 60 60   Resp: 16 16 17    Temp: 98.2 F (36.8 C) 97.9 F (36.6 C) 98.1 F (36.7 C)   TempSrc: Oral Oral Oral   SpO2: 99% 100% 98%   Weight:      Height:        General: Pt is alert, awake, not in acute distress Cardiovascular: RRR, S1/S2 +, no rubs, no gallops Respiratory: CTA bilaterally, no wheezing, no rhonchi Abdominal: Soft, NT, ND, bowel sounds + Extremities:  Right lower extremity bandaged    The results of significant diagnostics from this hospitalization (including imaging, microbiology, ancillary and laboratory) are listed below for reference.     Microbiology: Recent Results (from the past 240 hour(s))  SARS CORONAVIRUS 2 (TAT 6-24 HRS) Nasopharyngeal Nasopharyngeal Swab     Status: None   Collection Time: 02/09/19 10:32 AM   Specimen: Nasopharyngeal Swab  Result Value Ref Range Status   SARS Coronavirus 2 NEGATIVE NEGATIVE Final    Comment: (NOTE) SARS-CoV-2 target nucleic acids are NOT DETECTED. The SARS-CoV-2 RNA is generally detectable in upper and lower respiratory specimens during the acute phase of infection. Negative results do not preclude SARS-CoV-2 infection, do not rule out co-infections with other pathogens, and should not be used as the sole basis for treatment or other patient management decisions. Negative results must be combined with clinical observations, patient history, and epidemiological information. The expected result is Negative. Fact Sheet for Patients: SugarRoll.be Fact Sheet for Healthcare Providers: https://www.woods-mathews.com/ This test is not yet approved or cleared by the [place] FDA and  has been authorized for detection and/or diagnosis of SARS-CoV-2 by FDA under an Emergency Use Authorization (EUA). This EUA will remain  in effect (meaning this test can be used) for the duration of the COVID-19 declaration under Section 56 4(b)(1) of the Act, 21 U.S.C. section 360bbb-3(b)(1),  unless the authorization is terminated or revoked sooner. Performed at [facility], [place] [address], [place], [place] [address]   Aerobic/Anaerobic Culture (surgical/deep wound)     Status: None (Preliminary result)   Collection Time: 02/11/19  7:38 AM  Specimen: PATH Amputaion Arm/Leg; Tissue  Result Value Ref Range Status   Specimen Description TISSUE RIGHT FOOT FOREFOOT  Final   Special Requests NONE  Final   Gram Stain NO WBC SEEN NO ORGANISMS SEEN   Final   Culture   Final    NO GROWTH 1 DAY Performed at [facility], [place] [address], [place], [place] [address]    Report Status PENDING  Incomplete  Aerobic/Anaerobic Culture (surgical/deep wound)     Status: None (Preliminary result)   Collection Time: 02/11/19  7:42 AM   Specimen: Soft Tissue, Other  Result Value Ref Range Status   Specimen Description BONE RIGHT FOOT  Final   Special Requests NONE  Final   Gram Stain NO WBC SEEN NO ORGANISMS SEEN   Final   Culture   Final    NO GROWTH 1 DAY Performed at [facility], [place] [address], [place], [place] [address]    Report Status PENDING  Incomplete     Labs: BNP (last 3 results) No results for input(s): BNP in the last 8760 hours. Basic Metabolic Panel: Recent Labs  Lab 02/11/19 1037  NA 142  K 4.7  CL 114*  CO2 23  GLUCOSE 163*  BUN 10  CREATININE 0.98  CALCIUM 8.9   Liver Function Tests: No results for input(s): AST, ALT, ALKPHOS, BILITOT, PROT, ALBUMIN in the last 168 hours. No results for input(s): LIPASE, AMYLASE in the last 168 hours. No results for input(s): AMMONIA in the last 168 hours. CBC: Recent Labs  Lab 02/11/19 1037 02/12/19 0418  WBC 7.0 9.8  HGB 9.5* 8.7*  HCT 28.7* 25.2*  MCV 85.2 84.0  PLT 305 276   Cardiac Enzymes: No results for input(s): CKTOTAL, CKMB, CKMBINDEX, TROPONINI in the last 168 hours. BNP: Invalid input(s): POCBNP CBG: Recent Labs  Lab 02/11/19 1132 02/11/19 1637 02/11/19 2024  02/12/19 0633 02/12/19 1308  GLUCAP 204* 146* 295* 130* 137*   D-Dimer No results for input(s): DDIMER in the last 72 hours. Hgb A1c No results for input(s): HGBA1C in the last 72 hours. Lipid Profile No results for input(s): CHOL, HDL, LDLCALC, TRIG, CHOLHDL, LDLDIRECT in the last 72 hours. Thyroid function studies No results for input(s): TSH, T4TOTAL, T3FREE, THYROIDAB in the last 72 hours.  Invalid input(s): FREET3 Anemia work up No results for input(s): VITAMINB12, FOLATE, FERRITIN, TIBC, IRON, RETICCTPCT in the last 72 hours. Urinalysis    Component Value Date/Time   COLORURINE LT YELLOW 08/04/2007 1447   APPEARANCEUR Clear 08/04/2007 1447   LABSPEC 1.010 08/04/2007 1447   PHURINE 5.5 08/04/2007 1447   GLUCOSEU > or = 1000 mg/dL (AA) 08/04/2007 1447   BILIRUBINUR NEGATIVE 08/04/2007 1447   KETONESUR NEGATIVE 08/04/2007 1447   UROBILINOGEN 0.2 mg/dL 08/04/2007 1447   NITRITE Negative 08/04/2007 1447   LEUKOCYTESUR Negative 08/04/2007 1447   Sepsis Labs Invalid input(s): PROCALCITONIN,  WBC,  LACTICIDVEN Microbiology Recent Results (from the past 240 hour(s))  SARS CORONAVIRUS 2 (TAT 6-24 HRS) Nasopharyngeal Nasopharyngeal Swab     Status: None   Collection Time: 02/09/19 10:32 AM   Specimen: Nasopharyngeal Swab  Result Value Ref Range Status   SARS Coronavirus 2 NEGATIVE NEGATIVE Final    Comment: (NOTE) SARS-CoV-2 target nucleic acids are NOT DETECTED. The SARS-CoV-2 RNA is generally detectable in upper and lower respiratory specimens during the acute phase of infection. Negative results do not preclude SARS-CoV-2 infection, do not rule out co-infections with other pathogens, and should not  be used as the sole basis for treatment or other patient management decisions. Negative results must be combined with clinical observations, patient history, and epidemiological information. The expected result is Negative. Fact Sheet for  Patients: SugarRoll.be Fact Sheet for Healthcare Providers: https://www.woods-mathews.com/ This test is not yet approved or cleared by the [place] FDA and  has been authorized for detection and/or diagnosis of SARS-CoV-2 by FDA under an Emergency Use Authorization (EUA). This EUA will remain  in effect (meaning this test can be used) for the duration of the COVID-19 declaration under Section 56 4(b)(1) of the Act, 21 U.S.C. section 360bbb-3(b)(1), unless the authorization is terminated or revoked sooner. Performed at [facility], [place] [address], [place], [place] [address]   Aerobic/Anaerobic Culture (surgical/deep wound)     Status: None (Preliminary result)   Collection Time: 02/11/19  7:38 AM   Specimen: PATH Amputaion Arm/Leg; Tissue  Result Value Ref Range Status   Specimen Description TISSUE RIGHT FOOT FOREFOOT  Final   Special Requests NONE  Final   Gram Stain NO WBC SEEN NO ORGANISMS SEEN   Final   Culture   Final    NO GROWTH 1 DAY Performed at [facility], [place] [address], [place], [place] [address]    Report Status PENDING  Incomplete  Aerobic/Anaerobic Culture (surgical/deep wound)     Status: None (Preliminary result)   Collection Time: 02/11/19  7:42 AM   Specimen: Soft Tissue, Other  Result Value Ref Range Status   Specimen Description BONE RIGHT FOOT  Final   Special Requests NONE  Final   Gram Stain NO WBC SEEN NO ORGANISMS SEEN   Final   Culture   Final    NO GROWTH 1 DAY Performed at [facility], [place] [address], [place], [place] [address]    Report Status PENDING  Incomplete     Time coordinating discharge: 31 minutes  SIGNED:   Hosie Poisson, MD  [facility] 02/12/2019, 2:56 PM Pager   If 7PM-7AM, please contact night-coverage [email or web address] Password [email or web address]

## 2019-02-12 NOTE — Progress Notes (Signed)
Discharge instructions and packet given to pt. Pt not in distress and tolerated well.

## 2019-02-14 LAB — SURGICAL PATHOLOGY

## 2019-02-15 ENCOUNTER — Telehealth (HOSPITAL_COMMUNITY): Payer: Self-pay

## 2019-02-15 ENCOUNTER — Telehealth: Payer: Self-pay | Admitting: *Deleted

## 2019-02-15 MED ORDER — SPIRONOLACTONE 25 MG PO TABS: 25.0000 mg | ORAL_TABLET | Freq: Every day | ORAL | 0 refills | Status: DC

## 2019-02-15 NOTE — Telephone Encounter (Signed)
Requested Prescriptions   Signed Prescriptions Disp Refills  . spironolactone (ALDACTONE) 25 MG tablet 30 tablet 0    Sig: Take 1 tablet (25 mg total) by mouth daily. Must make appointment for further refills    Authorizing Provider: Jolaine Artist    Ordering User: Darcia Lampi R   rx refill requested by patient

## 2019-02-15 NOTE — Telephone Encounter (Signed)
I spoke with Dr. Posey Pronto and he states pt may experience this numbness, may loosen the ace if uncomfortable. I informed pt and he stated he didn't feel comfortable removing the dressing. I told pt to gently tug the ace at the areas that feel tight and may rest with the foot level with the foot for comfort and elevate as needed for comfort as well. Pt states he will see [place] 02/18/2019.

## 2019-02-15 NOTE — Telephone Encounter (Signed)
Pt called states his foot feels numb, and heavy, like it is asleep.

## 2019-02-16 LAB — AEROBIC/ANAEROBIC CULTURE W GRAM STAIN (SURGICAL/DEEP WOUND)
Culture: NO GROWTH
Culture: NO GROWTH
Gram Stain: NONE SEEN
Gram Stain: NONE SEEN

## 2019-02-18 ENCOUNTER — Other Ambulatory Visit: Payer: Self-pay

## 2019-02-18 ENCOUNTER — Ambulatory Visit (INDEPENDENT_AMBULATORY_CARE_PROVIDER_SITE_OTHER): Payer: PRIVATE HEALTH INSURANCE

## 2019-02-18 ENCOUNTER — Encounter: Payer: Self-pay | Admitting: Podiatry

## 2019-02-18 ENCOUNTER — Ambulatory Visit (INDEPENDENT_AMBULATORY_CARE_PROVIDER_SITE_OTHER): Payer: PRIVATE HEALTH INSURANCE | Admitting: Podiatry

## 2019-02-18 VITALS — BP 94/64 | HR 87 | Temp 97.9°F | Resp 16

## 2019-02-18 DIAGNOSIS — M79671 Pain in right foot: Secondary | ICD-10-CM

## 2019-02-18 DIAGNOSIS — S98131A Complete traumatic amputation of one right lesser toe, initial encounter: Secondary | ICD-10-CM

## 2019-02-18 DIAGNOSIS — Z89431 Acquired absence of right foot: Secondary | ICD-10-CM

## 2019-02-18 NOTE — Progress Notes (Signed)
Remote ICD transmission.   

## 2019-02-20 ENCOUNTER — Encounter: Payer: Self-pay | Admitting: Podiatry

## 2019-02-20 NOTE — Progress Notes (Signed)
Subjective:  Patient ID: Christopher Roth, male    DOB: 11/03/1970,  MRN: [id number]  Chief Complaint  Patient presents with   Routine Post Op    DOS 02/11/2019 Transmetatarsal Amputation Rt; pt stated, "My foot feels okay; I am not in any pain; I am a little nervous to see how my foot looks like"; pt Diabetic Type 2; Sugar=110 this am; A1C=7.7 (01/28/19)    DOS: 02/11/2019 Procedure: Transmetatarsal amputation of the right foot  48 y.o. male returns for post-op check.  Patient is doing very well.  This is his first postop visit after undergoing transmetatarsal amputation with Achilles tendon lengthening.  He states that he is doing well he has not been putting any weight on the foot.  His dressings appears clean dry and intact.  Is been nonweightbearing with crutches.  Review of Systems: Negative except as noted in the HPI. Denies N/V/F/Ch.  Past Medical History:  Diagnosis Date   Acute systolic congestive heart failure ([place]) 04/22/2016   AICD (automatic cardioverter/defibrillator) present 01/27/2018   [employer]   Diabetes mellitus with peripheral vascular disease ([place])    Type II   Diabetic foot ulcer ([place]) 01/31/2019   GERD (gastroesophageal reflux disease)    Hypertension    PVD (peripheral vascular disease) ([facility])    Tobacco use disorder    intermittent cigar use now    Current Outpatient Medications:    albuterol (PROVENTIL HFA;VENTOLIN HFA) 108 (90 Base) MCG/ACT inhaler, Inhale 2 puffs into the lungs every 6 (six) hours as needed for wheezing or shortness of breath., Disp: , Rfl:    Alcohol Swabs PADS, 1 Package by Does not apply route as needed (with blood glucose checks)., Disp: 1 each, Rfl: 0   aspirin EC 81 MG tablet, Take 81 mg by mouth daily., Disp: , Rfl:    atorvastatin (LIPITOR) 80 MG tablet, Take 1 tablet (80 mg total) by mouth every evening., Disp: 30 tablet, Rfl: 3   carvedilol (COREG) 6.25 MG tablet, Take 1 tablet (6.25 mg total) by mouth 2 (two)  times daily with a meal., Disp: 180 tablet, Rfl: 3   clopidogrel (PLAVIX) 75 MG tablet, Take 1 tablet (75 mg total) by mouth daily., Disp: 30 tablet, Rfl: 11   Coenzyme Q10 (COQ10) 100 MG CAPS, Take 100 mg by mouth daily., Disp: , Rfl:    collagenase (SANTYL) ointment, Apply 1 application topically daily., Disp: 15 g, Rfl: 2   dapagliflozin propanediol (FARXIGA) 5 MG TABS tablet, Take 5 mg by mouth daily. , Disp: , Rfl:    docusate sodium (COLACE) 100 MG capsule, Take 1 capsule (100 mg total) by mouth 2 (two) times daily., Disp: 10 capsule, Rfl: 0   Dulaglutide 0.75 MG/0.5ML SOPN, Inject into the skin., Disp: , Rfl:    furosemide (LASIX) 20 MG tablet, Take 1 tablet (20 mg total) by mouth as needed for fluid. (Patient taking differently: Take 20 mg by mouth daily as needed for fluid. ), Disp: 30 tablet, Rfl: 11   glucose blood (CHOICE DM FORA G20 TEST STRIPS) test strip, Use as instructed, Disp: 100 each, Rfl: 11   glucose monitoring kit (FREESTYLE) monitoring kit, 1 each by Does not apply route as needed for other., Disp: 1 each, Rfl: 0   isosorbide-hydrALAZINE (BIDIL) 20-37.5 MG tablet, Take 2 tablets by mouth 3 (three) times daily., Disp: 180 tablet, Rfl: 0   Lancets (FREESTYLE) lancets, Use as instructed, Disp: 100 each, Rfl: 11   metFORMIN (GLUCOPHAGE) 1000 MG tablet, Take  1,000 mg by mouth 2 (two) times daily with a meal., Disp: , Rfl:    povidone-Iodine (BETADINE) 5 % SOLN topical solution, Apply 1 application topically 3 (three) times daily as needed for wound care (foot wound)., Disp: , Rfl:    sacubitril-valsartan (ENTRESTO) 97-103 MG, Take 1 tablet by mouth 2 (two) times daily., Disp: 180 tablet, Rfl: 2   sitaGLIPtin (JANUVIA) 100 MG tablet, Take 100 mg by mouth every evening. , Disp: , Rfl:    sodium chloride irrigation 0.9 % irrigation, Irrigate with 1 application as directed 4 (four) times daily as needed (wound care)., Disp: , Rfl:    spironolactone (ALDACTONE) 25 MG  tablet, Take 1 tablet (25 mg total) by mouth daily. Must make appointment for further refills, Disp: 30 tablet, Rfl: 0   TRULICITY 1.61 WR/6.0AV SOPN, INJECT 0.5 MLS (0.75MG) SUBCUTANEOUSLY ONCE A WEEK, Disp: , Rfl:   Social History   Tobacco Use  Smoking Status Current Some Day Smoker   Packs/day: 1.00   Years: 24.00   Pack years: 24.00   Types: Cigars   Last attempt to quit: 03/27/2016   Years since quitting: 2.9  Smokeless Tobacco Never Used  Tobacco Comment   h/o cigarettes with ongoing occasional cigar use    Allergies  Allergen Reactions   Nickel Rash   Objective:   Vitals:   02/18/19 1416  BP: 94/64  Pulse: 87  Resp: 16  Temp: 97.9 F (36.6 C)   There is no height or weight on file to calculate BMI. Constitutional Well developed. Well nourished.  Vascular Foot warm and well perfused. Capillary refill normal to all digits.   Neurologic Normal speech. Oriented to person, place, and time. Epicritic sensation to light touch grossly present bilaterally.  Dermatologic Skin healing well without signs of infection. Skin edges well coapted without signs of infection.  Orthopedic: Tenderness to palpation noted about the surgical site.   Radiographs: 2 views of skeletally mature adult.  No signs of soft tissue emphysema noted.  No signs of osteomyelitis noted.  Surgical amputation margins are sharp.  No other bony deformities noted. Assessment:   1. Amputation of toe of right foot ([place])    Plan:  Patient was evaluated and treated and all questions answered.  S/p foot surgery right -Progressing as expected post-operatively. -XR: See above -WB Status: Nonweightbearing in cam boot with crutches -Sutures: Intact with any clinical signs of infection.  I take this sutures out in 2 weeks. -Medications: None -Foot redressed.  Betadine wet-to-dry dressing changes were applied to the right foot. -Patient was given a cam boot  No follow-ups on file.

## 2019-02-21 ENCOUNTER — Telehealth: Payer: Self-pay

## 2019-02-21 NOTE — Telephone Encounter (Signed)
COVID-19 Pre-Screening Questions:02/21/19   Do you currently have a fever (>100 F), chills or unexplained body aches? NO  Are you currently experiencing new cough, shortness of breath, sore throat, runny nose? *NO .  Have you recently travelled outside the state of [place] in the last 14 days?NO .  Have you been in contact with someone that is currently pending confirmation of Covid19 testing or has been confirmed to have the [place] virus?  NO  **If the patient answers NO to ALL questions -  advise the patient to please call the clinic before coming to the office should any symptoms develop.

## 2019-02-22 ENCOUNTER — Encounter: Payer: Self-pay | Admitting: Infectious Disease

## 2019-02-22 ENCOUNTER — Other Ambulatory Visit: Payer: Self-pay

## 2019-02-22 ENCOUNTER — Ambulatory Visit (INDEPENDENT_AMBULATORY_CARE_PROVIDER_SITE_OTHER): Payer: 59 | Admitting: Infectious Disease

## 2019-02-22 VITALS — BP 123/77 | HR 89 | Temp 97.8°F | Wt 207.0 lb

## 2019-02-22 DIAGNOSIS — L97508 Non-pressure chronic ulcer of other part of unspecified foot with other specified severity: Secondary | ICD-10-CM

## 2019-02-22 DIAGNOSIS — I739 Peripheral vascular disease, unspecified: Secondary | ICD-10-CM

## 2019-02-22 DIAGNOSIS — E1169 Type 2 diabetes mellitus with other specified complication: Secondary | ICD-10-CM

## 2019-02-22 DIAGNOSIS — E13621 Other specified diabetes mellitus with foot ulcer: Secondary | ICD-10-CM | POA: Diagnosis not present

## 2019-02-22 DIAGNOSIS — M869 Osteomyelitis, unspecified: Secondary | ICD-10-CM | POA: Diagnosis not present

## 2019-02-22 DIAGNOSIS — Z95810 Presence of automatic (implantable) cardiac defibrillator: Secondary | ICD-10-CM | POA: Diagnosis not present

## 2019-02-22 DIAGNOSIS — E785 Hyperlipidemia, unspecified: Secondary | ICD-10-CM

## 2019-02-22 DIAGNOSIS — E1165 Type 2 diabetes mellitus with hyperglycemia: Secondary | ICD-10-CM | POA: Diagnosis not present

## 2019-02-22 HISTORY — DX: Osteomyelitis, unspecified: M86.9

## 2019-02-22 NOTE — Progress Notes (Signed)
Chief complaint: follow-up for DFU sp amputations  Subjective:    Patient ID: Christopher Roth, male    DOB: [birth year], 48 y.o.   MRN: [id number]  HPI   Christopher Roth is a 48 year old African-American man with multiple medical problems including systolic congestive heart failure with ICD in place diabetes mellitus, former smoker who has had problems with diabetic foot ulcers and was followed at [facility] where he was seeing podiatry with Dr. Virl Cagey   He has had an ulceration on the medial aspect of his distal forefoot and also laterally.  There is now concerns on x-ray for possible osteomyelitis.  He has had debridements done at [facility] which in July yielded a polymicrobial culture including a fairly sensitive Proteus mirabilis and E. coli and enterococcus though I cannot see all the susceptibility reports this was in November 23, 2018.  This September 4 he had debridement and grew methicillin sensitive Staphylococcus aureus.  He has been treated with Augmentin and also Keflex.  He has been seen by Dr. Jacqualyn Posey [facility] foot center.  He has significant vascular disease and also been seen by Dr. Gwenlyn Saran and underwent an angiogram on September 15 that "showed occlusion of the anterior tibial artery at the origin which was crossed and treated with a balloon with resolution of stenosis.  He had 80% stenosis of the first 8 cm the origin of the peroneal artery which was also angioplastied with resolution he is also started on Plavix with intention having being on for the next 3 months.  He has had plain film suggesting as mentioned osteomyelitis involving the fifth meta tarsal phalangeal joint.  The first metatarsal phalangeal joint is also concerning with ulceration.  An MRI is complete scheduled.  He does have an ICD in place as well.  Reassuring be off antibiotics in case we could get some operative cultures to help guide further antibiotics should he have surgery.  He had CT scan of the foot done which  showed osteomyelitis involving the first metatarsal the first metatarsal.  In the interim he has had surgery with Dr. Posey Pronto and has undergone right foot transmetatarsal amputation.  Cultures were sent for aerobic and anaerobic pathogens and nothing was recovered from culture.  The margins on pathology found to be clear which is encouraging.  Patient is doing well and is going to have sutures removed with Dr. Posey Pronto.    Past Medical History:  Diagnosis Date  . Acute systolic congestive heart failure ([place]) 04/22/2016  . 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 Diabetes mellitus with peripheral vascular disease ([facility])    Type II  . Diabetic foot ulcer ([place]) 01/31/2019  . GERD (gastroesophageal reflux disease)   . Hypertension   . PVD (peripheral vascular disease) ([place])   . Tobacco use disorder    intermittent cigar use now    Past Surgical History:  Procedure Laterality Date  . ABDOMINAL AORTOGRAM W/LOWER EXTREMITY Bilateral 01/18/2019   Procedure: ABDOMINAL AORTOGRAM W/LOWER EXTREMITY;  Surgeon: Serafina Mitchell, MD;  Location: [facility];  Service: Cardiovascular;  Laterality: Bilateral;  . 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 AMPUTATION TOE Right 02/11/2019   Procedure: AMPUTATION OF TRANSMETATARSAL OF RIGHT FOOT.;  Surgeon: Felipa Furnace, DPM;  Location: [facility];  Service: Podiatry;  Laterality: Right;  . CARDIAC CATHETERIZATION N/A 04/23/2016   Procedure: Right/Left Heart Cath and Coronary Angiography;  Surgeon: Belva Crome, MD;  Location: [facility];  Service: Cardiovascular;  Laterality: N/A;  . ICD IMPLANT N/A 01/27/2018   Procedure: ICD IMPLANT;  Surgeon: Deboraha Sprang, MD;  Location: [facility];  Service: Cardiovascular;  Laterality: N/A;  . INCISION AND DRAINAGE PERIRECTAL ABSCESS Right 11/17/2014   Procedure: debridement of right perianal and groin wound;   Surgeon: Johnathan Hausen, MD;  Location: [facility];  Service: General;  Laterality: Right;  . PERIPHERAL VASCULAR BALLOON ANGIOPLASTY Right 01/18/2019   Procedure: PERIPHERAL VASCULAR BALLOON ANGIOPLASTY;  Surgeon: Serafina Mitchell, MD;  Location: [facility];  Service: Cardiovascular;  Laterality: Right;  posterior tibial, peroneal    Family History  Problem Relation Age of Onset  . Diabetes Mellitus II Mother       Social History   Socioeconomic History  . Marital status: Married    Spouse name: Not on file  . Number of children: Not on file  . Years of education: Not on file  . Highest education level: Not on file  Occupational History  . Not on file  Social Needs  . Financial resource strain: Not on file  . Food insecurity    Worry: Not on file    Inability: Not on file  . Transportation needs    Medical: Not on file    Non-medical: Not on file  Tobacco Use  . Smoking status: Current Some Day Smoker    Packs/day: 1.00    Years: 24.00    Pack years: 24.00    Types: Cigars    Last attempt to quit: 03/27/2016    Years since quitting: 2.9  . Smokeless tobacco: Never Used  . Tobacco comment: h/o cigarettes with ongoing occasional cigar use  Substance and Sexual Activity  . Alcohol use: Yes    Comment: occ  . Drug use: No  . Sexual activity: Not on file  Lifestyle  . Physical activity    Days per week: Not on file    Minutes per session: Not on file  . Stress: Not on file  Relationships  . Social Herbalist on phone: Not on file    Gets together: Not on file    Attends religious service: Not on file    Active member of club or organization: Not on file    Attends meetings of clubs or organizations: Not on file    Relationship status: Not on file  Other Topics Concern  . Not on file  Social History Narrative  . Not on file    Allergies  Allergen Reactions  . Nickel Rash     Current Outpatient Medications:  .  albuterol (PROVENTIL  HFA;VENTOLIN HFA) 108 (90 Base) MCG/ACT inhaler, Inhale 2 puffs into the lungs every 6 (six) hours as needed for wheezing or shortness of breath., Disp: , Rfl:  .  Alcohol Swabs PADS, 1 Package by Does not apply route as needed (with blood glucose checks)., Disp: 1 each, Rfl: 0 .  aspirin EC 81 MG tablet, Take 81 mg by mouth daily., Disp: , Rfl:  .  atorvastatin (LIPITOR) 80 MG tablet, Take 1 tablet (80 mg total) by mouth every evening., Disp: 30 tablet, Rfl: 3 .  carvedilol (COREG) 6.25 MG tablet, Take 1 tablet (6.25 mg total) by mouth 2 (two) times daily with a meal., Disp: 180 tablet, Rfl: 3 .  clopidogrel (PLAVIX) 75 MG tablet, Take 1 tablet (75 mg total) by mouth daily., Disp: 30 tablet, Rfl: 11 .  Coenzyme Q10 (COQ10) 100 MG CAPS, Take 100 mg by mouth daily., Disp: , Rfl:  .  collagenase (SANTYL) ointment, Apply 1 application topically daily., Disp: 15 g, Rfl: 2 .  dapagliflozin propanediol (FARXIGA) 5 MG TABS tablet, Take 5 mg by mouth daily. , Disp: , Rfl:  .  docusate sodium (COLACE) 100 MG capsule, Take 1 capsule (100 mg total) by mouth 2 (two) times daily., Disp: 10 capsule, Rfl: 0 .  Dulaglutide 0.75 MG/0.5ML SOPN, Inject into the skin., Disp: , Rfl:  .  furosemide (LASIX) 20 MG tablet, Take 1 tablet (20 mg total) by mouth as needed for fluid. (Patient taking differently: Take 20 mg by mouth daily as needed for fluid. ), Disp: 30 tablet, Rfl: 11 .  glucose blood (CHOICE DM FORA G20 TEST STRIPS) test strip, Use as instructed, Disp: 100 each, Rfl: 11 .  glucose monitoring kit (FREESTYLE) monitoring kit, 1 each by Does not apply route as needed for other., Disp: 1 each, Rfl: 0 .  isosorbide-hydrALAZINE (BIDIL) 20-37.5 MG tablet, Take 2 tablets by mouth 3 (three) times daily., Disp: 180 tablet, Rfl: 0 .  Lancets (FREESTYLE) lancets, Use as instructed, Disp: 100 each, Rfl: 11 .  metFORMIN (GLUCOPHAGE) 1000 MG tablet, Take 1,000 mg by mouth 2 (two) times daily with a meal., Disp: , Rfl:  .   povidone-Iodine (BETADINE) 5 % SOLN topical solution, Apply 1 application topically 3 (three) times daily as needed for wound care (foot wound)., Disp: , Rfl:  .  sacubitril-valsartan (ENTRESTO) 97-103 MG, Take 1 tablet by mouth 2 (two) times daily., Disp: 180 tablet, Rfl: 2 .  sitaGLIPtin (JANUVIA) 100 MG tablet, Take 100 mg by mouth every evening. , Disp: , Rfl:  .  sodium chloride irrigation 0.9 % irrigation, Irrigate with 1 application as directed 4 (four) times daily as needed (wound care)., Disp: , Rfl:  .  spironolactone (ALDACTONE) 25 MG tablet, Take 1 tablet (25 mg total) by mouth daily. Must make appointment for further refills, Disp: 30 tablet, Rfl: 0 .  TRULICITY 5.68 LE/7.5TZ SOPN, INJECT 0.5 MLS (0.75MG) SUBCUTANEOUSLY ONCE A WEEK, Disp: , Rfl:    Review of Systems  Constitutional: Negative for chills and fever.  HENT: Negative for congestion and sore throat.   Eyes: Negative for photophobia.  Respiratory: Negative for cough, shortness of breath and wheezing.   Cardiovascular: Negative for chest pain, palpitations and leg swelling.  Gastrointestinal: Negative for abdominal pain, blood in stool, constipation, diarrhea, nausea and vomiting.  Genitourinary: Negative for dysuria, flank pain and hematuria.  Musculoskeletal: Negative for back pain and myalgias.  Skin: Positive for wound. Negative for rash.  Neurological: Negative for dizziness, weakness and headaches.  Hematological: Does not bruise/bleed easily.  Psychiatric/Behavioral: Negative for agitation, behavioral problems, dysphoric mood and suicidal ideas.       Objective:   Physical Exam Constitutional:      General: He is not in acute distress.    Appearance: Normal appearance. He is well-developed. He is not ill-appearing or diaphoretic.  HENT:     Head: Normocephalic and atraumatic.     Right Ear: Hearing and external ear normal.     Left Ear: Hearing and external ear normal.     Nose: No nasal deformity or  rhinorrhea.  Eyes:     General: No scleral icterus.    Conjunctiva/sclera: Conjunctivae normal.     Right eye: Right conjunctiva is not injected.     Left eye: Left conjunctiva is not injected.  Pupils: Pupils are equal, round, and reactive to light.  Neck:     Musculoskeletal: Normal range of motion and neck supple.     Vascular: No JVD.  Cardiovascular:     Rate and Rhythm: Normal rate and regular rhythm.     Heart sounds: S1 normal and S2 normal. No murmur. No friction rub.  Pulmonary:     Effort: Pulmonary effort is normal. No respiratory distress.     Breath sounds: No wheezing.  Abdominal:     General: Abdomen is flat. Bowel sounds are normal. There is no distension.     Palpations: Abdomen is soft.     Tenderness: There is no abdominal tenderness.  Musculoskeletal: Normal range of motion.     Right shoulder: Normal.     Left shoulder: Normal.     Right hip: Normal.     Left hip: Normal.     Right knee: Normal.     Left knee: Normal.  Lymphadenopathy:     Head:     Right side of head: No submandibular, preauricular or posterior auricular adenopathy.     Left side of head: No submandibular, preauricular or posterior auricular adenopathy.     Cervical: No cervical adenopathy.     Right cervical: No superficial or deep cervical adenopathy.    Left cervical: No superficial or deep cervical adenopathy.  Skin:    General: Skin is warm and dry.     Coloration: Skin is not pale.     Findings: No abrasion, bruising, ecchymosis, erythema, lesion or rash.     Nails: There is no clubbing.   Neurological:     Mental Status: He is alert and oriented to person, place, and time.     Sensory: No sensory deficit.     Coordination: Coordination normal.     Gait: Gait normal.  Psychiatric:        Attention and Perception: He is attentive.        Mood and Affect: Mood normal.        Speech: Speech normal.        Behavior: Behavior normal. Behavior is cooperative.        Thought  Content: Thought content normal.        Judgment: Judgment normal.      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  He is undergone transmetatarsal amputation of the right foot.  Margins were clear from surgical pathology cultures did not yield an organism is not on antibiotics but seems to be doing well and the wound seems to be healing well.  We will check some inflammatory markers CBC and bit him basic metabolic panel.  I do not think there is any evidence of residual osteomyelitis by pathology and he is doing well without antibiotics.  He can follow-up as needed.  His left foot does have a callus underlying his first toe but does not appear overtly infected have asked him to talk to podiatry about proper offloading shoes to reduce the risk of further pressure ulcers developing.

## 2019-02-23 ENCOUNTER — Telehealth: Payer: Self-pay | Admitting: *Deleted

## 2019-02-23 ENCOUNTER — Ambulatory Visit: Payer: PRIVATE HEALTH INSURANCE

## 2019-02-23 ENCOUNTER — Other Ambulatory Visit: Payer: Self-pay

## 2019-02-23 DIAGNOSIS — Z89431 Acquired absence of right foot: Secondary | ICD-10-CM

## 2019-02-23 DIAGNOSIS — S98131A Complete traumatic amputation of one right lesser toe, initial encounter: Secondary | ICD-10-CM

## 2019-02-23 LAB — CBC WITH DIFFERENTIAL/PLATELET
Absolute Monocytes: 451 cells/uL (ref 200–950)
Basophils Absolute: 43 cells/uL (ref 0–200)
Basophils Relative: 0.5 %
Eosinophils Absolute: 77 cells/uL (ref 15–500)
Eosinophils Relative: 0.9 %
HCT: 30.5 % — ABNORMAL LOW (ref 38.5–50.0)
Hemoglobin: 9.8 g/dL — ABNORMAL LOW (ref 13.2–17.1)
Lymphs Abs: 2100 cells/uL (ref 850–3900)
MCH: 28.6 pg (ref 27.0–33.0)
MCHC: 32.1 g/dL (ref 32.0–36.0)
MCV: 88.9 fL (ref 80.0–100.0)
MPV: 10.8 fL (ref 7.5–12.5)
Monocytes Relative: 5.3 %
Neutro Abs: 5831 cells/uL (ref 1500–7800)
Neutrophils Relative %: 68.6 %
Platelets: 389 10*3/uL (ref 140–400)
RBC: 3.43 10*6/uL — ABNORMAL LOW (ref 4.20–5.80)
RDW: 15.7 % — ABNORMAL HIGH (ref 11.0–15.0)
Total Lymphocyte: 24.7 %
WBC: 8.5 10*3/uL (ref 3.8–10.8)

## 2019-02-23 LAB — BASIC METABOLIC PANEL WITH GFR
BUN: 9 mg/dL (ref 7–25)
CO2: 23 mmol/L (ref 20–32)
Calcium: 9.2 mg/dL (ref 8.6–10.3)
Chloride: 111 mmol/L — ABNORMAL HIGH (ref 98–110)
Creat: 1.04 mg/dL (ref 0.60–1.35)
GFR, Est African American: 98 mL/min/{1.73_m2} (ref >=60)
GFR, Est Non African American: 85 mL/min/{1.73_m2} (ref >=60)
Glucose, Bld: 135 mg/dL — ABNORMAL HIGH (ref 65–99)
Potassium: 4.3 mmol/L (ref 3.5–5.3)
Sodium: 141 mmol/L (ref 135–146)

## 2019-02-23 LAB — SEDIMENTATION RATE: Sed Rate: 17 mm/h — ABNORMAL HIGH (ref 0–15)

## 2019-02-23 LAB — C-REACTIVE PROTEIN: CRP: 0.6 mg/L (ref <8.0)

## 2019-02-23 NOTE — Telephone Encounter (Signed)
Pt states he saw the Infectious Disease doctor yesterday and they redress the foot without betadine and he wanted to know if that was okay, pt states he is having some pain in the top of the foot.

## 2019-02-23 NOTE — Telephone Encounter (Signed)
I informed pt of Dr. Serita Grit orders and transferred to scheduler.

## 2019-02-23 NOTE — Telephone Encounter (Signed)
Yes have him come in for jessica to apply betadine wet to dry dressings.

## 2019-02-25 ENCOUNTER — Encounter: Payer: PRIVATE HEALTH INSURANCE | Admitting: Podiatry

## 2019-03-02 ENCOUNTER — Other Ambulatory Visit (HOSPITAL_COMMUNITY): Payer: Self-pay | Admitting: Adult Health

## 2019-03-02 ENCOUNTER — Telehealth (HOSPITAL_COMMUNITY): Payer: Self-pay

## 2019-03-02 NOTE — Telephone Encounter (Signed)
Pt called for samples of bidil.  Jasmine CMA to arrange for samples to be picked up.  Refill was sent in by pharmacy staff for when samples fun out. Pt appreciative.

## 2019-03-04 ENCOUNTER — Ambulatory Visit (INDEPENDENT_AMBULATORY_CARE_PROVIDER_SITE_OTHER): Payer: PRIVATE HEALTH INSURANCE | Admitting: Podiatry

## 2019-03-04 ENCOUNTER — Encounter: Payer: Self-pay | Admitting: Podiatry

## 2019-03-04 ENCOUNTER — Other Ambulatory Visit: Payer: Self-pay

## 2019-03-04 DIAGNOSIS — Z9889 Other specified postprocedural states: Secondary | ICD-10-CM

## 2019-03-04 DIAGNOSIS — Z89431 Acquired absence of right foot: Secondary | ICD-10-CM

## 2019-03-04 DIAGNOSIS — E1165 Type 2 diabetes mellitus with hyperglycemia: Secondary | ICD-10-CM

## 2019-03-04 NOTE — Progress Notes (Signed)
Subjective:  Patient ID: Christopher Roth, male    DOB: October 06, 1970,  MRN: [id number]  Chief Complaint  Patient presents with   Routine Post Op    DOS 02/11/2019 Transmetatarsal Amputation Rt   "Its healing up hopefully"    DOS: 02/11/2019 Procedure: Transmetatarsal amputation right closed  48 y.o. male returns for post-op check.  Patient denies any other acute complaints.  Patient has been ambulating with his sneakers.  Patient stitches are intact.  No clinical signs of infection.  The wound is healing really well.  Patient is a diabetic with last A1c of 7.2.  He states that his sugars have been well controlled.  Review of Systems: Negative except as noted in the HPI. Denies N/V/F/Ch.  Past Medical History:  Diagnosis Date   Acute systolic congestive heart failure ([place]) 04/22/2016   AICD (automatic cardioverter/defibrillator) present 01/27/2018   [employer]   Diabetes mellitus with peripheral vascular disease ([place])    Type II   Diabetic foot ulcer ([place]) 01/31/2019   GERD (gastroesophageal reflux disease)    Hypertension    Osteomyelitis of ankle and foot ([place]) 02/22/2019   PVD (peripheral vascular disease) ([place])    Tobacco use disorder    intermittent cigar use now    Current Outpatient Medications:    albuterol (PROVENTIL HFA;VENTOLIN HFA) 108 (90 Base) MCG/ACT inhaler, Inhale 2 puffs into the lungs every 6 (six) hours as needed for wheezing or shortness of breath., Disp: , Rfl:    Alcohol Swabs PADS, 1 Package by Does not apply route as needed (with blood glucose checks)., Disp: 1 each, Rfl: 0   aspirin EC 81 MG tablet, Take 81 mg by mouth daily., Disp: , Rfl:    atorvastatin (LIPITOR) 80 MG tablet, Take 1 tablet (80 mg total) by mouth every evening., Disp: 30 tablet, Rfl: 3   BIDIL 20-37.5 MG tablet, TAKE 2 TABLETS BY MOUTH THREE TIMES DAILY, Disp: 180 tablet, Rfl: 0   carvedilol (COREG) 6.25 MG tablet, Take 1 tablet (6.25 mg total) by mouth 2 (two) times  daily with a meal., Disp: 180 tablet, Rfl: 3   clopidogrel (PLAVIX) 75 MG tablet, Take 1 tablet (75 mg total) by mouth daily., Disp: 30 tablet, Rfl: 11   Coenzyme Q10 (COQ10) 100 MG CAPS, Take 100 mg by mouth daily., Disp: , Rfl:    collagenase (SANTYL) ointment, Apply 1 application topically daily., Disp: 15 g, Rfl: 2   dapagliflozin propanediol (FARXIGA) 5 MG TABS tablet, Take 5 mg by mouth daily. , Disp: , Rfl:    docusate sodium (COLACE) 100 MG capsule, Take 1 capsule (100 mg total) by mouth 2 (two) times daily., Disp: 10 capsule, Rfl: 0   Dulaglutide 0.75 MG/0.5ML SOPN, Inject into the skin., Disp: , Rfl:    furosemide (LASIX) 20 MG tablet, Take 1 tablet (20 mg total) by mouth as needed for fluid. (Patient taking differently: Take 20 mg by mouth daily as needed for fluid. ), Disp: 30 tablet, Rfl: 11   glucose blood (CHOICE DM FORA G20 TEST STRIPS) test strip, Use as instructed, Disp: 100 each, Rfl: 11   glucose monitoring kit (FREESTYLE) monitoring kit, 1 each by Does not apply route as needed for other., Disp: 1 each, Rfl: 0   Lancets (FREESTYLE) lancets, Use as instructed, Disp: 100 each, Rfl: 11   metFORMIN (GLUCOPHAGE) 1000 MG tablet, Take 1,000 mg by mouth 2 (two) times daily with a meal., Disp: , Rfl:    povidone-Iodine (BETADINE) 5 % SOLN  topical solution, Apply 1 application topically 3 (three) times daily as needed for wound care (foot wound)., Disp: , Rfl:    sacubitril-valsartan (ENTRESTO) 97-103 MG, Take 1 tablet by mouth 2 (two) times daily., Disp: 180 tablet, Rfl: 2   sitaGLIPtin (JANUVIA) 100 MG tablet, Take 100 mg by mouth every evening. , Disp: , Rfl:    sodium chloride irrigation 0.9 % irrigation, Irrigate with 1 application as directed 4 (four) times daily as needed (wound care)., Disp: , Rfl:    spironolactone (ALDACTONE) 25 MG tablet, Take 1 tablet (25 mg total) by mouth daily. Must make appointment for further refills, Disp: 30 tablet, Rfl: 0   TRULICITY  6.95 QH/2.2VJ SOPN, INJECT 0.5 MLS (0.75MG) SUBCUTANEOUSLY ONCE A WEEK, Disp: , Rfl:   Social History   Tobacco Use  Smoking Status Current Some Day Smoker   Packs/day: 1.00   Years: 24.00   Pack years: 24.00   Types: Cigars   Last attempt to quit: 03/27/2016   Years since quitting: 2.9  Smokeless Tobacco Never Used  Tobacco Comment   h/o cigarettes with ongoing occasional cigar use    Allergies  Allergen Reactions   Nickel Rash   Objective:  There were no vitals filed for this visit. There is no height or weight on file to calculate BMI. Constitutional Well developed. Well nourished.  Vascular Foot warm and well perfused. Capillary refill normal to all digits.   Neurologic Normal speech. Oriented to person, place, and time. Epicritic sensation to light touch grossly present bilaterally.  Dermatologic Skin healing well without signs of infection. Skin edges well coapted without signs of infection.  Orthopedic: Tenderness to palpation noted about the surgical site.   Radiographs: None Assessment:   1. 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 Postoperative state   3. Uncontrolled type 2 diabetes mellitus with hyperglycemia ([place])    Plan:  Patient was evaluated and treated and all questions answered.  S/p foot surgery right -Progressing as expected post-operatively. -XR: None -WB Status: Partial weightbearing to the heel in cam boot -Sutures: Sutures were removed today.  No dehiscence noted.  No clinical signs of infection noted. -Medications: None -Foot redressed. -Patient is scheduled to see rec for diabetic insoles status post transmetatarsal amputation.  He will need a filler to take up the space in the shoes.  Return in about 4 weeks (around 04/01/2019) for POV-xray next visit.

## 2019-03-08 ENCOUNTER — Telehealth: Payer: Self-pay | Admitting: *Deleted

## 2019-03-08 ENCOUNTER — Other Ambulatory Visit: Payer: Self-pay

## 2019-03-08 ENCOUNTER — Ambulatory Visit (INDEPENDENT_AMBULATORY_CARE_PROVIDER_SITE_OTHER): Payer: PRIVATE HEALTH INSURANCE

## 2019-03-08 ENCOUNTER — Encounter: Payer: Self-pay | Admitting: Podiatry

## 2019-03-08 ENCOUNTER — Encounter: Payer: Self-pay | Admitting: Sports Medicine

## 2019-03-08 ENCOUNTER — Ambulatory Visit (INDEPENDENT_AMBULATORY_CARE_PROVIDER_SITE_OTHER): Payer: Self-pay | Admitting: Sports Medicine

## 2019-03-08 DIAGNOSIS — Z9889 Other specified postprocedural states: Secondary | ICD-10-CM

## 2019-03-08 DIAGNOSIS — E1165 Type 2 diabetes mellitus with hyperglycemia: Secondary | ICD-10-CM

## 2019-03-08 DIAGNOSIS — Z89431 Acquired absence of right foot: Secondary | ICD-10-CM | POA: Diagnosis not present

## 2019-03-08 DIAGNOSIS — S99921A Unspecified injury of right foot, initial encounter: Secondary | ICD-10-CM

## 2019-03-08 NOTE — Progress Notes (Signed)
Subjective: Christopher Roth is a 48 y.o. male patient seen today in office for evaluation of right surgical foot.  Patient is status post transmetatarsal amputation performed on 02/11/2019 by Dr. Posey Pronto.  Patient reports that he almost slipped and fell on yesterday but he caught himself and since he has felt increased tingling to the right foot.  Patient reports that he was nervous that he could have messed up something so wanted to have his foot checked.  Patient denies any increase in pain besides tingling denies any nausea vomiting fever chills or any other constitutional symptoms.  Patient is diabetic and admits to a history previously of neuropathy but otherwise denies any new symptoms or problems at this time.    Patient Active Problem List   Diagnosis Date Noted  . Osteomyelitis of ankle and foot ([place]) 02/22/2019  . Status post transmetatarsal amputation of foot, right ([place]) 02/11/2019  . PVD (peripheral vascular disease) ([place])   . Diabetes mellitus with peripheral vascular disease ([place])   . Diabetic foot ulcer ([place]) 01/31/2019  . Diabetes mellitus ([place]) 01/28/2019  . Dyspnea 10/26/2018  . ICD (implantable cardioverter-defibrillator) in place 03/19/2018  . Ischemic cardiomyopathy 01/27/2018  . AICD (automatic cardioverter/defibrillator) present 01/27/2018  . Eczema 12/26/2016  . Seasonal allergies 12/26/2016  . CAD (coronary artery disease) 04/29/2016  . Systolic CHF with reduced left ventricular function, NYHA class 2 ([place])   . Chronic systolic CHF (congestive heart failure) ([place]) 04/22/2016  . Hyperlipidemia associated with type 2 diabetes mellitus ([place]) 02/05/2015  . Nicotine dependence, cigarettes, with other nicotine-induced disorders 02/05/2015  . Tobacco use disorder 11/21/2014  . Diabetes mellitus type II, uncontrolled ([place])   . OBESITY 01/01/2007  . ERECTILE DYSFUNCTION 01/01/2007  . Essential hypertension 01/01/2007  . ALLERGIC RHINITIS 01/01/2007    Current Outpatient  Medications on File Prior to Visit  Medication Sig Dispense Refill  . albuterol (PROVENTIL HFA;VENTOLIN HFA) 108 (90 Base) MCG/ACT inhaler Inhale 2 puffs into the lungs every 6 (six) hours as needed for wheezing or shortness of breath.    . Alcohol Swabs PADS 1 Package by Does not apply route as needed (with blood glucose checks). 1 each 0  . aspirin EC 81 MG tablet Take 81 mg by mouth daily.    Marland Kitchen atorvastatin (LIPITOR) 80 MG tablet Take 1 tablet (80 mg total) by mouth every evening. 30 tablet 3  . BIDIL 20-37.5 MG tablet TAKE 2 TABLETS BY MOUTH THREE TIMES DAILY 180 tablet 0  . carvedilol (COREG) 6.25 MG tablet Take 1 tablet (6.25 mg total) by mouth 2 (two) times daily with a meal. 180 tablet 3  . clopidogrel (PLAVIX) 75 MG tablet Take 1 tablet (75 mg total) by mouth daily. 30 tablet 11  . Coenzyme Q10 (COQ10) 100 MG CAPS Take 100 mg by mouth daily.    . collagenase (SANTYL) ointment Apply 1 application topically daily. 15 g 2  . dapagliflozin propanediol (FARXIGA) 5 MG TABS tablet Take 5 mg by mouth daily.     Marland Kitchen docusate sodium (COLACE) 100 MG capsule Take 1 capsule (100 mg total) by mouth 2 (two) times daily. 10 capsule 0  . Dulaglutide 0.75 MG/0.5ML SOPN Inject into the skin.    . furosemide (LASIX) 20 MG tablet Take 1 tablet (20 mg total) by mouth as needed for fluid. (Patient taking differently: Take 20 mg by mouth daily as needed for fluid. ) 30 tablet 11  . glucose blood (CHOICE DM FORA G20 TEST STRIPS) test strip Use  as instructed 100 each 11  . glucose monitoring kit (FREESTYLE) monitoring kit 1 each by Does not apply route as needed for other. 1 each 0  . Lancets (FREESTYLE) lancets Use as instructed 100 each 11  . metFORMIN (GLUCOPHAGE) 1000 MG tablet Take 1,000 mg by mouth 2 (two) times daily with a meal.    . povidone-Iodine (BETADINE) 5 % SOLN topical solution Apply 1 application topically 3 (three) times daily as needed for wound care (foot wound).    . sacubitril-valsartan  (ENTRESTO) 97-103 MG Take 1 tablet by mouth 2 (two) times daily. 180 tablet 2  . sitaGLIPtin (JANUVIA) 100 MG tablet Take 100 mg by mouth every evening.     . sodium chloride irrigation 0.9 % irrigation Irrigate with 1 application as directed 4 (four) times daily as needed (wound care).    Marland Kitchen spironolactone (ALDACTONE) 25 MG tablet Take 1 tablet (25 mg total) by mouth daily. Must make appointment for further refills 30 tablet 0  . TRULICITY 5.85 ID/7.8EU SOPN INJECT 0.5 MLS (0.75MG) SUBCUTANEOUSLY ONCE A WEEK     No current facility-administered medications on file prior to visit.     Allergies  Allergen Reactions  . Nickel Rash    Objective: There were no vitals filed for this visit.  General: No acute distress, AAOx3  Right foot: TMA site healing well with no opening or dehiscence.  There is mild swelling noted to the right foot and lower leg.  There is no redness no warmth no signs of infection.  There is no pain with palpation to calf Homan or Thompson sign is negative.    Post Op Xray, Right consistent with postoperative amputation status with no other acute findings.  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 ([place])    -  Primary   Relevant Orders   DG Foot Complete Right (Completed)   Postoperative state       Injury of foot, right, initial encounter       Reports he almost slipped and fell       -Patient seen and evaluated -X-rays reviewed no acute findings -Applied Betadine and dry sterile dressing to surgical site right/left foot secured with ACE wrap and stockinet  -Advised patient to make sure to keep dressings clean, dry, and intact to right surgical site, removing the ACE as needed  -Advised patient to continue with cam boot and crutches -Advised patient to limit activity to  necessity  -Advised patient to ice and elevate as necessary  -Will plan for postop check with Dr. Posey Pronto at next office visit. In the meantime, patient to call office if any issues or problems arise.   Landis Martins, DPM

## 2019-03-08 NOTE — Telephone Encounter (Signed)
Pt states Dr. Posey Pronto amputated some of his toes, and yesterday he almost fell and his foot slipped in the boot, and it does feel right and he would like to be seen to make sure he didn't open up the wound. I asked L. McGee - Scheduler to get in today. Pt is scheduled to see Dr. Cannon Kettle today at 1:45PM.

## 2019-03-09 ENCOUNTER — Ambulatory Visit: Payer: PRIVATE HEALTH INSURANCE | Admitting: Orthotics

## 2019-03-11 NOTE — Progress Notes (Signed)
Patient is here today for postoperative dressing change, his current bandage is loose and no longer covering his incisions.  Her mood old bandages, reapplied dry sterile gauze with compressive wrap.  He is to follow-up at his regularly scheduled appointment or sooner with any acute symptom changes.

## 2019-03-21 ENCOUNTER — Other Ambulatory Visit (HOSPITAL_COMMUNITY): Payer: Self-pay | Admitting: *Deleted

## 2019-03-21 MED ORDER — SPIRONOLACTONE 25 MG PO TABS: 25.0000 mg | ORAL_TABLET | Freq: Every day | ORAL | 0 refills | Status: DC

## 2019-03-25 ENCOUNTER — Encounter: Payer: Self-pay | Admitting: Podiatry

## 2019-03-25 ENCOUNTER — Other Ambulatory Visit: Payer: Self-pay

## 2019-03-25 ENCOUNTER — Telehealth: Payer: Self-pay | Admitting: *Deleted

## 2019-03-25 ENCOUNTER — Ambulatory Visit (INDEPENDENT_AMBULATORY_CARE_PROVIDER_SITE_OTHER): Payer: PRIVATE HEALTH INSURANCE | Admitting: Podiatry

## 2019-03-25 DIAGNOSIS — Z9889 Other specified postprocedural states: Secondary | ICD-10-CM

## 2019-03-25 DIAGNOSIS — Z89431 Acquired absence of right foot: Secondary | ICD-10-CM

## 2019-03-25 DIAGNOSIS — E1165 Type 2 diabetes mellitus with hyperglycemia: Secondary | ICD-10-CM

## 2019-03-25 NOTE — Progress Notes (Signed)
Subjective:  Patient ID: Christopher Roth, male    DOB: 28-Nov-1970,  MRN: [id number]  Chief Complaint  Patient presents with  . Routine Post Op    POV #3 dos 10.09.2020 Transmetatarsal Amputation Rt, pt shows no signs of infection, bandages are nice and dry, pt is looking to know when to go back to work, and when to drive    DOS: 40/9/81 Procedure: Right TMA with TAL   48 y.o. male returns for post-op check. Patient is doing well. He would like to return to work. He states that he has not been getting it wet. He has been ambulating in CAM boot.  Review of Systems: Negative except as noted in the HPI. Denies N/V/F/Ch.  Past Medical History:  Diagnosis Date  . Acute systolic congestive heart failure ([place]) 04/22/2016  . 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 Diabetes mellitus with peripheral vascular disease ([facility])    Type II  . Diabetic foot ulcer ([place]) 01/31/2019  . GERD (gastroesophageal reflux disease)   . Hypertension   . Osteomyelitis of ankle and foot ([place]) 02/22/2019  . PVD (peripheral vascular disease) ([place])   . Tobacco use disorder    intermittent cigar use now    Current Outpatient Medications:  .  albuterol (PROVENTIL HFA;VENTOLIN HFA) 108 (90 Base) MCG/ACT inhaler, Inhale 2 puffs into the lungs every 6 (six) hours as needed for wheezing or shortness of breath., Disp: , Rfl:  .  Alcohol Swabs PADS, 1 Package by Does not apply route as needed (with blood glucose checks)., Disp: 1 each, Rfl: 0 .  aspirin EC 81 MG tablet, Take 81 mg by mouth daily., Disp: , Rfl:  .  atorvastatin (LIPITOR) 80 MG tablet, Take 1 tablet (80 mg total) by mouth every evening., Disp: 30 tablet, Rfl: 3 .  BIDIL 20-37.5 MG tablet, TAKE 2 TABLETS BY MOUTH THREE TIMES DAILY, Disp: 180 tablet, Rfl: 0 .  carvedilol (COREG) 6.25 MG tablet, Take 1 tablet (6.25 mg total) by mouth 2 (two) times daily with a meal., Disp: 180 tablet, Rfl: 3 .  clopidogrel (PLAVIX)  75 MG tablet, Take 1 tablet (75 mg total) by mouth daily., Disp: 30 tablet, Rfl: 11 .  Coenzyme Q10 (COQ10) 100 MG CAPS, Take 100 mg by mouth daily., Disp: , Rfl:  .  collagenase (SANTYL) ointment, Apply 1 application topically daily., Disp: 15 g, Rfl: 2 .  dapagliflozin propanediol (FARXIGA) 5 MG TABS tablet, Take 5 mg by mouth daily. , Disp: , Rfl:  .  docusate sodium (COLACE) 100 MG capsule, Take 1 capsule (100 mg total) by mouth 2 (two) times daily., Disp: 10 capsule, Rfl: 0 .  Dulaglutide 0.75 MG/0.5ML SOPN, Inject into the skin., Disp: , Rfl:  .  furosemide (LASIX) 20 MG tablet, Take 1 tablet (20 mg total) by mouth as needed for fluid. (Patient taking differently: Take 20 mg by mouth daily as needed for fluid. ), Disp: 30 tablet, Rfl: 11 .  glucose blood (CHOICE DM FORA G20 TEST STRIPS) test strip, Use as instructed, Disp: 100 each, Rfl: 11 .  glucose monitoring kit (FREESTYLE) monitoring kit, 1 each by Does not apply route as needed for other., Disp: 1 each, Rfl: 0 .  Lancets (FREESTYLE) lancets, Use as instructed, Disp: 100 each, Rfl: 11 .  metFORMIN (GLUCOPHAGE) 1000 MG tablet, Take 1,000 mg by mouth 2 (two) times daily with a meal., Disp: , Rfl:  .  povidone-Iodine (BETADINE) 5 % SOLN topical solution,  Apply 1 application topically 3 (three) times daily as needed for wound care (foot wound)., Disp: , Rfl:  .  sacubitril-valsartan (ENTRESTO) 97-103 MG, Take 1 tablet by mouth 2 (two) times daily., Disp: 180 tablet, Rfl: 2 .  sitaGLIPtin (JANUVIA) 100 MG tablet, Take 100 mg by mouth every evening. , Disp: , Rfl:  .  sodium chloride irrigation 0.9 % irrigation, Irrigate with 1 application as directed 4 (four) times daily as needed (wound care)., Disp: , Rfl:  .  spironolactone (ALDACTONE) 25 MG tablet, Take 1 tablet (25 mg total) by mouth daily. Must make appointment for further refills, Disp: 30 tablet, Rfl: 0 .  TRULICITY 1.92 YP/8.3IZ SOPN, INJECT 0.5 MLS (0.75MG) SUBCUTANEOUSLY ONCE A WEEK,  Disp: , Rfl:   Social History   Tobacco Use  Smoking Status Current Some Day Smoker  . Packs/day: 1.00  . Years: 24.00  . Pack years: 24.00  . Types: Cigars  . Last attempt to quit: 03/27/2016  . Years since quitting: 2.9  Smokeless Tobacco Never Used  Tobacco Comment   h/o cigarettes with ongoing occasional cigar use    Allergies  Allergen Reactions  . Nickel Rash   Objective:  There were no vitals filed for this visit. There is no height or weight on file to calculate BMI. Constitutional Well developed. Well nourished.  Vascular Foot warm and well perfused. Capillary refill normal to all digits.   Neurologic Normal speech. Oriented to person, place, and time. Epicritic sensation to light touch grossly present bilaterally.  Dermatologic Skin healing well without signs of infection. Skin edges well coapted without signs of infection.  Orthopedic: Tenderness to palpation noted about the surgical site.   Radiographs: None Assessment:  No diagnosis found. Plan:  Patient was evaluated and treated and all questions answered.  S/p foot surgery bilaterally -Progressing as expected post-operatively. -XR: None -WB Status: WBAT in CAM boot -Sutures: None. Skin is healed and completely re-epithelize. He can start to shower the foot.  -Medications: None -Foot redressed. -He will see Liliane Channel to get his diabetic insoles with filler picked up. Until then he will stay in his CAM boot with Darco Toe protector. -He can return to work  No follow-ups on file.

## 2019-03-25 NOTE — Telephone Encounter (Signed)
I called pt and he asked if he could wear the surgery sandal around the house and the diabetic shoe to work. I told pt that would be fine as long as it provided protection and support to the surgery foot.

## 2019-03-25 NOTE — Telephone Encounter (Signed)
Pt states he has another boot to wear at work, he has another boot that he would like to wear around the house.

## 2019-03-29 ENCOUNTER — Telehealth: Payer: Self-pay | Admitting: Sports Medicine

## 2019-03-29 NOTE — Telephone Encounter (Signed)
Pt called checking status of his inserts.  Upon checking it looks like they are expected to ship anytime and I will call pt when they come in.

## 2019-04-04 ENCOUNTER — Ambulatory Visit (INDEPENDENT_AMBULATORY_CARE_PROVIDER_SITE_OTHER): Payer: PRIVATE HEALTH INSURANCE | Admitting: Orthotics

## 2019-04-04 ENCOUNTER — Other Ambulatory Visit: Payer: Self-pay

## 2019-04-04 ENCOUNTER — Encounter: Payer: Self-pay | Admitting: Podiatry

## 2019-04-04 DIAGNOSIS — Z9889 Other specified postprocedural states: Secondary | ICD-10-CM

## 2019-04-04 DIAGNOSIS — Z89431 Acquired absence of right foot: Secondary | ICD-10-CM

## 2019-04-04 DIAGNOSIS — S98131A Complete traumatic amputation of one right lesser toe, initial encounter: Secondary | ICD-10-CM

## 2019-04-04 DIAGNOSIS — E1165 Type 2 diabetes mellitus with hyperglycemia: Secondary | ICD-10-CM

## 2019-04-04 NOTE — Progress Notes (Signed)
Patient picked up A5500 diabetic shoes and L5000 toe filler. The toe filler fit very well (transmet) with a good space between filler and residual foot; therfore there should be no skin irritation

## 2019-04-05 ENCOUNTER — Other Ambulatory Visit (HOSPITAL_COMMUNITY): Payer: Self-pay | Admitting: Adult Health

## 2019-04-26 ENCOUNTER — Telehealth (HOSPITAL_COMMUNITY): Payer: Self-pay | Admitting: Pharmacy Technician

## 2019-04-26 ENCOUNTER — Other Ambulatory Visit (HOSPITAL_COMMUNITY): Payer: Self-pay | Admitting: Cardiology

## 2019-04-26 MED ORDER — SPIRONOLACTONE 25 MG PO TABS: 25.0000 mg | ORAL_TABLET | Freq: Every day | ORAL | 1 refills | Status: DC

## 2019-04-26 NOTE — Telephone Encounter (Signed)
Advanced Heart Failure Patient Advocate Encounter  Prior Authorization for Delene Loll has been approved.    PA# [id number] Effective dates: 04/26/2019 through 04/25/2020  Charlann Boxer, CPhT

## 2019-05-11 ENCOUNTER — Ambulatory Visit (INDEPENDENT_AMBULATORY_CARE_PROVIDER_SITE_OTHER): Payer: 59 | Admitting: *Deleted

## 2019-05-11 DIAGNOSIS — I255 Ischemic cardiomyopathy: Secondary | ICD-10-CM | POA: Diagnosis not present

## 2019-05-11 LAB — CUP PACEART REMOTE DEVICE CHECK
Date Time Interrogation Session: 20210106110554
Implantable Lead Implant Date: 20190925
Implantable Lead Location: 753860
Implantable Lead Model: 293
Implantable Lead Serial Number: 441633
Implantable Pulse Generator Implant Date: 20190925
Pulse Gen Serial Number: 244469

## 2019-05-31 ENCOUNTER — Ambulatory Visit (HOSPITAL_COMMUNITY)
Admission: RE | Admit: 2019-05-31 | Discharge: 2019-05-31 | Disposition: A | Discharge status: Home / Self Care | Payer: 59 | Source: Ambulatory Visit | Attending: Adult Health | Admitting: Adult Health

## 2019-05-31 ENCOUNTER — Other Ambulatory Visit: Payer: Self-pay

## 2019-05-31 ENCOUNTER — Encounter (HOSPITAL_COMMUNITY): Payer: Self-pay

## 2019-05-31 VITALS — BP 156/92 | HR 84 | Wt 214.0 lb

## 2019-05-31 DIAGNOSIS — I5042 Chronic combined systolic (congestive) and diastolic (congestive) heart failure: Secondary | ICD-10-CM | POA: Insufficient documentation

## 2019-05-31 DIAGNOSIS — I255 Ischemic cardiomyopathy: Secondary | ICD-10-CM | POA: Diagnosis not present

## 2019-05-31 DIAGNOSIS — I2510 Atherosclerotic heart disease of native coronary artery without angina pectoris: Secondary | ICD-10-CM

## 2019-05-31 DIAGNOSIS — R0602 Shortness of breath: Secondary | ICD-10-CM | POA: Insufficient documentation

## 2019-05-31 DIAGNOSIS — F17210 Nicotine dependence, cigarettes, uncomplicated: Secondary | ICD-10-CM | POA: Insufficient documentation

## 2019-05-31 DIAGNOSIS — I10 Essential (primary) hypertension: Secondary | ICD-10-CM | POA: Diagnosis not present

## 2019-05-31 DIAGNOSIS — E1151 Type 2 diabetes mellitus with diabetic peripheral angiopathy without gangrene: Secondary | ICD-10-CM | POA: Diagnosis not present

## 2019-05-31 DIAGNOSIS — Z79899 Other long term (current) drug therapy: Secondary | ICD-10-CM | POA: Insufficient documentation

## 2019-05-31 DIAGNOSIS — Z7982 Long term (current) use of aspirin: Secondary | ICD-10-CM | POA: Diagnosis not present

## 2019-05-31 DIAGNOSIS — Z95810 Presence of automatic (implantable) cardiac defibrillator: Secondary | ICD-10-CM | POA: Diagnosis not present

## 2019-05-31 DIAGNOSIS — I110 Hypertensive heart disease with heart failure: Secondary | ICD-10-CM | POA: Diagnosis not present

## 2019-05-31 DIAGNOSIS — I5022 Chronic systolic (congestive) heart failure: Secondary | ICD-10-CM

## 2019-05-31 DIAGNOSIS — Z7984 Long term (current) use of oral hypoglycemic drugs: Secondary | ICD-10-CM | POA: Insufficient documentation

## 2019-05-31 LAB — BASIC METABOLIC PANEL
Anion gap: 8 (ref 5–15)
BUN: 17 mg/dL (ref 6–20)
CO2: 25 mmol/L (ref 22–32)
Calcium: 8.9 mg/dL (ref 8.9–10.3)
Chloride: 107 mmol/L (ref 98–111)
Creatinine, Ser: 1.28 mg/dL — ABNORMAL HIGH (ref 0.61–1.24)
GFR calc Af Amer: >60 mL/min (ref >60)
GFR calc non Af Amer: >60 mL/min (ref >60)
Glucose, Bld: 160 mg/dL — ABNORMAL HIGH (ref 70–99)
Potassium: 3.8 mmol/L (ref 3.5–5.1)
Sodium: 140 mmol/L (ref 135–145)

## 2019-05-31 MED ORDER — HYDRALAZINE HCL 50 MG PO TABS: 75.0000 mg | ORAL_TABLET | Freq: Three times a day (TID) | ORAL | 3 refills | Status: DC

## 2019-05-31 MED ORDER — ISOSORBIDE MONONITRATE ER 30 MG PO TB24: 30.0000 mg | ORAL_TABLET | Freq: Every day | ORAL | 3 refills | Status: DC

## 2019-05-31 NOTE — Patient Instructions (Signed)
STOP Bidil START Hydralazine 75 mg, one and one half tab three times daily START Imdur 30 mg, one tab daily  Labs today We will only contact you if something comes back abnormal or we need to make some changes. Otherwise no news is good news!  Your physician has requested that you have an echocardiogram. Echocardiography is a painless test that uses sound waves to create images of your heart. It provides your doctor with information about the size and shape of your heart and how well your heart's chambers and valves are working. This procedure takes approximately one hour. There are no restrictions for this procedure.   Your physician recommends that you schedule a follow-up appointment in: 3-4 months  With Dr [name]  Do the following things EVERYDAY: 1) Weigh yourself in the morning before breakfast. Write it down and keep it in a log. 2) Take your medicines as prescribed 3) Eat low salt foods--Limit salt (sodium) to 2000 mg per day.  4) Stay as active as you can everyday 5) Limit all fluids for the day to less than 2 liters   At the [facility], you and your health needs are our priority. As part of our continuing mission to provide you with exceptional heart care, we have created designated Provider Care Teams. These Care Teams include your primary Cardiologist (physician) and Advanced Practice Providers (APPs- Physician Assistants and Nurse Practitioners) who all work together to provide you with the care you need, when you need it.   You may see any of the following providers on your designated Care Team at your next follow up: Marland Kitchen Dr Glori Bickers . Dr Loralie Champagne . Darrick Grinder, NP . Lyda Jester, PA . Audry Riles, PharmD   Please be sure to bring in all your medications bottles to every appointment.

## 2019-05-31 NOTE — Progress Notes (Signed)
Advanced Heart Failure Clinic Note   Referring Physician: Crenshaw Primary Care: [name], MD at [facility] family Primary Cardiologist: Dr Stanford Breed HF: Dr. [name]  HPI: Christopher Roth is a 49 y.o. male with PMH of HTN, DM2, Tobacco abuse, CAD, and systolic CHF. S/P [employer] ICD 01/2018.   Admitted 04/20/16 -> 04/25/16 with new onest HF symptoms. Echo 04/21/16 LVEF 25-30%, Grade 2 DD. Diuresed 12 lbs. LHC with significant CAD as below.  Evaluated 11/25/17 in ED with chest pain. Not thouught to be ACS. Troponin 0.0. BNP 1397. He was instructed to double lasix for 3 days and follow up with HF.  He is s/p R transmetatarsal amputation for osteomyelitis.   Today he returns for HF follow up.Overall feeling fine. SOB with moderate exertion. Denies PND/Orthopnea. Appetite ok. No fever or chills. Taking all medications. Only taking bidil twice a day.  Has hard time paying for bidil.   Social: Lives in [place], Wife and Son  ECHO 12/2017 EF 25-30%  cMRI 07/16/16 -> recalculated 08/19/16: EF 41% scar in LAD distribution. No viability   LHC 04/23/16 - Recent total occlusion of the proximal to mid LAD.  - 70% ramus intermedius, 65% ostial circumflex followed by 70% mid circumflex, 75% proximal LAD prior to the origin of a small to moderate size diagonal, and 90% mid PDA. - Mild PAH, PCWP 14 - LVEF 10-15% Hemodynamics RHC Procedural Findings: Hemodynamics (mmHg) RA mean 4 RV 39/1 PA 37/12 PCWP 14 AO 115/78 Cardiac Output (Fick) 5.63 Cardiac Index (Fick) 2.71  Past Medical History:  Diagnosis Date  . Acute systolic congestive heart failure ([place]) 04/22/2016  . 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 Diabetes mellitus with peripheral vascular disease (HCC)    Type II  . Diabetic foot ulcer ([place]) 01/31/2019  . GERD (gastroesophageal reflux disease)   . Hypertension   . Osteomyelitis of ankle and foot ([place]) 02/22/2019  . PVD  (peripheral vascular disease) ([place])   . Tobacco use disorder    intermittent cigar use now    Current Outpatient Medications  Medication Sig Dispense Refill  . albuterol (PROVENTIL HFA;VENTOLIN HFA) 108 (90 Base) MCG/ACT inhaler Inhale 2 puffs into the lungs every 6 (six) hours as needed for wheezing or shortness of breath.    . Alcohol Swabs PADS 1 Package by Does not apply route as needed (with blood glucose checks). 1 each 0  . aspirin EC 81 MG tablet Take 81 mg by mouth daily.    Marland Kitchen atorvastatin (LIPITOR) 80 MG tablet Take 1 tablet (80 mg total) by mouth every evening. 30 tablet 3  . BIDIL 20-37.5 MG tablet TAKE 2 TABLETS BY MOUTH THREE TIMES DAILY (Patient taking differently: Take 2 tablets by mouth 2 (two) times daily. ) 180 tablet 0  . carvedilol (COREG) 6.25 MG tablet Take 1 tablet (6.25 mg total) by mouth 2 (two) times daily with a meal. 180 tablet 3  . clopidogrel (PLAVIX) 75 MG tablet Take 1 tablet (75 mg total) by mouth daily. 30 tablet 11  . Coenzyme Q10 (COQ10) 100 MG CAPS Take 100 mg by mouth daily.    . collagenase (SANTYL) ointment Apply 1 application topically daily. 15 g 2  . docusate sodium (COLACE) 100 MG capsule Take 1 capsule (100 mg total) by mouth 2 (two) times daily. 10 capsule 0  . Dulaglutide 0.75 MG/0.5ML SOPN Inject into the skin.    . furosemide (LASIX) 20 MG tablet Take 1 tablet (20 mg total)  by mouth as needed for fluid. (Patient taking differently: Take 20 mg by mouth daily as needed for fluid. ) 30 tablet 11  . glucose blood (CHOICE DM FORA G20 TEST STRIPS) test strip Use as instructed 100 each 11  . glucose monitoring kit (FREESTYLE) monitoring kit 1 each by Does not apply route as needed for other. 1 each 0  . Lancets (FREESTYLE) lancets Use as instructed 100 each 11  . metFORMIN (GLUCOPHAGE) 1000 MG tablet Take 1,000 mg by mouth 2 (two) times daily with a meal.    . povidone-Iodine (BETADINE) 5 % SOLN topical solution Apply 1 application topically 3 (three)  times daily as needed for wound care (foot wound).    . sacubitril-valsartan (ENTRESTO) 97-103 MG Take 1 tablet by mouth 2 (two) times daily. 180 tablet 2  . sitaGLIPtin (JANUVIA) 100 MG tablet Take 100 mg by mouth every evening.     . sodium chloride irrigation 0.9 % irrigation Irrigate with 1 application as directed 4 (four) times daily as needed (wound care).    Marland Kitchen spironolactone (ALDACTONE) 25 MG tablet Take 1 tablet (25 mg total) by mouth daily. 30 tablet 1  . TRULICITY 4.09 WJ/1.9JY SOPN INJECT 0.5 MLS (0.75MG) SUBCUTANEOUSLY ONCE A WEEK    . dapagliflozin propanediol (FARXIGA) 5 MG TABS tablet Take 5 mg by mouth daily.      No current facility-administered medications for this encounter.    Allergies  Allergen Reactions  . Nickel Rash     Social History   Socioeconomic History  . Marital status: Married    Spouse name: Not on file  . Number of children: Not on file  . Years of education: Not on file  . Highest education level: Not on file  Occupational History  . Not on file  Tobacco Use  . Smoking status: Current Some Day Smoker    Packs/day: 1.00    Years: 24.00    Pack years: 24.00    Types: Cigars    Last attempt to quit: 03/27/2016    Years since quitting: 3.1  . Smokeless tobacco: Never Used  . Tobacco comment: h/o cigarettes with ongoing occasional cigar use  Substance and Sexual Activity  . Alcohol use: Yes    Comment: occ  . Drug use: No  . Sexual activity: Not on file  Other Topics Concern  . Not on file  Social History Narrative  . Not on file   Social Determinants of Health   Financial Resource Strain:   . Difficulty of Paying Living Expenses: Not on file  Food Insecurity:   . Worried About Charity fundraiser in the Last Year: Not on file  . Ran Out of Food in the Last Year: Not on file  Transportation Needs:   . Lack of Transportation (Medical): Not on file  . Lack of Transportation (Non-Medical): Not on file  Physical Activity:   . Days of  Exercise per Week: Not on file  . Minutes of Exercise per Session: Not on file  Stress:   . Feeling of Stress : Not on file  Social Connections:   . Frequency of Communication with Friends and Family: Not on file  . Frequency of Social Gatherings with Friends and Family: Not on file  . Attends Religious Services: Not on file  . Active Member of Clubs or Organizations: Not on file  . Attends Archivist Meetings: Not on file  . Marital Status: Not on file  Intimate Partner Violence:   .  Fear of Current or Ex-Partner: Not on file  . Emotionally Abused: Not on file  . Physically Abused: Not on file  . Sexually Abused: Not on file   Family History  Problem Relation Age of Onset  . Diabetes Mellitus II Mother     Vitals:   05/31/19 1402  BP: (!) 156/92  Pulse: 84  SpO2: 99%  Weight: 97.1 kg (214 lb)   Wt Readings from Last 3 Encounters:  05/31/19 97.1 kg (214 lb)  02/22/19 93.9 kg (207 lb)  02/09/19 96.2 kg (212 lb)   PHYSICAL EXAM: General:  Well appearing. No resp difficulty HEENT: normal Neck: supple. no JVD. Carotids 2+ bilat; no bruits. No lymphadenopathy or thryomegaly appreciated. Cor: PMI nondisplaced. Regular rate & rhythm. No rubs, gallops or murmurs. Left upper chest ICD  Lungs: clear Abdomen: soft, nontender, nondistended. No hepatosplenomegaly. No bruits or masses. Good bowel sounds. Extremities: no cyanosis, clubbing, rash, edema Neuro: alert & orientedx3, cranial nerves grossly intact. moves all 4 extremities w/o difficulty. Affect pleasant    ASSESSMENT & PLAN:  1. Chronic combined systolic and diastolic CHF -  ICM with Echo 04/2016 LVEF 25-30%, Grade 2 DD (EF 10-15% by cath) - Cath with chronic occlusion of LAD with no viability on MRI. Moderate non-obstructive CAD elsewhere - cMRI 07/16/16 -> recalculated 08/2016 EF 41%. Anterior scar with no significant viability - S/P [employer] ICD 01/2018 [facility] Scientific Score 5  - NYHA II-III.  [place] status stable.  - Continue Coreg 6.25 mg BID. Historically up-titration has been limited by bradycardia.  - Continue Entresto to 97/1106m BID.  - Stop bidil due to cost. Start hydralazine 75 mg three times a day + imdur 30 mg dialy.  - Continue spiro 25 mg daily.  - Check BMET today.  2. [place] - Chronic occlusion of LAD with no viability on MRI. Moderate non-obstructive CAD elsewhere - No chest pain.    - Continue ASA and statin 3. HTN Elevated. Increase hydralazine as noted above. Discussed that he needs to take hydralazine three times a day.  4. DMII - Per PCP.   5. Tobacco abuse - Quit 2017 6. R Foot Osteomyelitis R foot transmet amputation 2020.  Set up for repeat ECHO. Check BMET today. Discussed medication changes. .Darrick Grinder NP  2:38 PM

## 2019-06-15 ENCOUNTER — Other Ambulatory Visit (HOSPITAL_COMMUNITY): Payer: Self-pay | Admitting: *Deleted

## 2019-06-15 MED ORDER — ATORVASTATIN CALCIUM 80 MG PO TABS: 80.0000 mg | ORAL_TABLET | Freq: Every evening | ORAL | 3 refills | Status: DC

## 2019-06-16 ENCOUNTER — Other Ambulatory Visit: Payer: Self-pay

## 2019-06-16 ENCOUNTER — Ambulatory Visit (HOSPITAL_COMMUNITY)
Admission: RE | Admit: 2019-06-16 | Discharge: 2019-06-16 | Disposition: A | Discharge status: Home / Self Care | Payer: 59 | Source: Ambulatory Visit | Attending: Internal Medicine | Admitting: Internal Medicine

## 2019-06-16 DIAGNOSIS — I255 Ischemic cardiomyopathy: Secondary | ICD-10-CM | POA: Diagnosis not present

## 2019-06-16 DIAGNOSIS — I110 Hypertensive heart disease with heart failure: Secondary | ICD-10-CM | POA: Diagnosis not present

## 2019-06-16 DIAGNOSIS — F17200 Nicotine dependence, unspecified, uncomplicated: Secondary | ICD-10-CM | POA: Insufficient documentation

## 2019-06-16 DIAGNOSIS — I5022 Chronic systolic (congestive) heart failure: Secondary | ICD-10-CM | POA: Diagnosis present

## 2019-06-16 DIAGNOSIS — Z95810 Presence of automatic (implantable) cardiac defibrillator: Secondary | ICD-10-CM | POA: Diagnosis not present

## 2019-06-16 DIAGNOSIS — E119 Type 2 diabetes mellitus without complications: Secondary | ICD-10-CM | POA: Diagnosis not present

## 2019-06-16 DIAGNOSIS — E785 Hyperlipidemia, unspecified: Secondary | ICD-10-CM | POA: Insufficient documentation

## 2019-06-16 NOTE — Progress Notes (Signed)
  Echocardiogram 2D Echocardiogram has been performed.  Christopher Roth 06/16/2019, 3:57 PM

## 2019-07-05 ENCOUNTER — Other Ambulatory Visit (HOSPITAL_COMMUNITY): Payer: Self-pay | Admitting: *Deleted

## 2019-07-05 MED ORDER — SPIRONOLACTONE 25 MG PO TABS: 25.0000 mg | ORAL_TABLET | Freq: Every day | ORAL | 1 refills | Status: DC

## 2019-08-02 ENCOUNTER — Other Ambulatory Visit (HOSPITAL_COMMUNITY): Payer: Self-pay | Admitting: *Deleted

## 2019-08-02 MED ORDER — ENTRESTO 97-103 MG PO TABS: 1.0000 | ORAL_TABLET | Freq: Two times a day (BID) | ORAL | 2 refills | Status: DC

## 2019-08-10 ENCOUNTER — Ambulatory Visit (INDEPENDENT_AMBULATORY_CARE_PROVIDER_SITE_OTHER): Payer: 59 | Admitting: *Deleted

## 2019-08-10 DIAGNOSIS — I255 Ischemic cardiomyopathy: Secondary | ICD-10-CM | POA: Diagnosis not present

## 2019-08-10 LAB — CUP PACEART REMOTE DEVICE CHECK
Battery Remaining Longevity: 174 mo
Battery Remaining Percentage: 100 %
Brady Statistic RV Percent Paced: 0 %
Date Time Interrogation Session: 20210407043200
HighPow Impedance: 57 Ohm
Implantable Lead Implant Date: 20190925
Implantable Lead Location: 753860
Implantable Lead Model: 293
Implantable Lead Serial Number: 441633
Implantable Pulse Generator Implant Date: 20190925
Lead Channel Impedance Value: 471 Ohm
Lead Channel Setting Pacing Amplitude: 2.5 V
Lead Channel Setting Pacing Pulse Width: 0.4 ms
Lead Channel Setting Sensing Sensitivity: 0.5 mV
Pulse Gen Serial Number: 244469

## 2019-08-10 NOTE — Progress Notes (Signed)
ICD Remote  

## 2019-08-24 ENCOUNTER — Ambulatory Visit: Payer: PRIVATE HEALTH INSURANCE | Admitting: Podiatry

## 2019-08-25 ENCOUNTER — Other Ambulatory Visit (HOSPITAL_COMMUNITY): Payer: Self-pay | Admitting: *Deleted

## 2019-08-25 MED ORDER — CARVEDILOL 6.25 MG PO TABS: 6.2500 mg | ORAL_TABLET | Freq: Two times a day (BID) | ORAL | 3 refills | Status: DC

## 2019-08-30 ENCOUNTER — Encounter (HOSPITAL_COMMUNITY): Payer: Self-pay | Admitting: Internal Medicine

## 2019-08-30 ENCOUNTER — Ambulatory Visit (HOSPITAL_COMMUNITY)
Admission: RE | Admit: 2019-08-30 | Discharge: 2019-08-30 | Disposition: A | Discharge status: Home / Self Care | Payer: 59 | Source: Ambulatory Visit | Attending: Internal Medicine | Admitting: Internal Medicine

## 2019-08-30 ENCOUNTER — Other Ambulatory Visit (HOSPITAL_COMMUNITY): Payer: Self-pay | Admitting: Internal Medicine

## 2019-08-30 ENCOUNTER — Other Ambulatory Visit: Payer: Self-pay

## 2019-08-30 VITALS — BP 148/82 | HR 72 | Wt 216.0 lb

## 2019-08-30 DIAGNOSIS — Z95810 Presence of automatic (implantable) cardiac defibrillator: Secondary | ICD-10-CM | POA: Diagnosis not present

## 2019-08-30 DIAGNOSIS — I10 Essential (primary) hypertension: Secondary | ICD-10-CM | POA: Diagnosis not present

## 2019-08-30 DIAGNOSIS — R0683 Snoring: Secondary | ICD-10-CM | POA: Diagnosis not present

## 2019-08-30 DIAGNOSIS — Z7984 Long term (current) use of oral hypoglycemic drugs: Secondary | ICD-10-CM | POA: Insufficient documentation

## 2019-08-30 DIAGNOSIS — Z79899 Other long term (current) drug therapy: Secondary | ICD-10-CM | POA: Insufficient documentation

## 2019-08-30 DIAGNOSIS — I5022 Chronic systolic (congestive) heart failure: Secondary | ICD-10-CM

## 2019-08-30 DIAGNOSIS — Z7982 Long term (current) use of aspirin: Secondary | ICD-10-CM | POA: Insufficient documentation

## 2019-08-30 DIAGNOSIS — I5042 Chronic combined systolic (congestive) and diastolic (congestive) heart failure: Secondary | ICD-10-CM | POA: Insufficient documentation

## 2019-08-30 DIAGNOSIS — I2510 Atherosclerotic heart disease of native coronary artery without angina pectoris: Secondary | ICD-10-CM | POA: Diagnosis not present

## 2019-08-30 DIAGNOSIS — F17210 Nicotine dependence, cigarettes, uncomplicated: Secondary | ICD-10-CM | POA: Diagnosis not present

## 2019-08-30 DIAGNOSIS — K219 Gastro-esophageal reflux disease without esophagitis: Secondary | ICD-10-CM | POA: Diagnosis not present

## 2019-08-30 DIAGNOSIS — Z833 Family history of diabetes mellitus: Secondary | ICD-10-CM | POA: Insufficient documentation

## 2019-08-30 DIAGNOSIS — E1151 Type 2 diabetes mellitus with diabetic peripheral angiopathy without gangrene: Secondary | ICD-10-CM | POA: Diagnosis not present

## 2019-08-30 DIAGNOSIS — I110 Hypertensive heart disease with heart failure: Secondary | ICD-10-CM | POA: Diagnosis not present

## 2019-08-30 MED ORDER — CARVEDILOL 6.25 MG PO TABS: 9.3750 mg | ORAL_TABLET | Freq: Two times a day (BID) | ORAL | 3 refills | Status: DC

## 2019-08-30 MED ORDER — FARXIGA 10 MG PO TABS: 10.0000 mg | ORAL_TABLET | Freq: Every day | ORAL | 6 refills | Status: DC

## 2019-08-30 NOTE — Progress Notes (Signed)
Advanced Heart Failure Clinic Note   Referring Physician: Crenshaw Primary Care: Dr. Ouida Sills, MD at [facility] family Primary Cardiologist: Dr Christopher Roth HF: Dr. Haroldine Roth  HPI: Christopher Roth is a 49 y.o. male with PMH of HTN, DM2, Tobacco abuse, CAD, and systolic CHF. S/P [employer] ICD 01/2018.   Admitted 04/20/16 -> 04/25/16 with new onest HF symptoms. Echo 04/21/16 LVEF 25-30%, Grade 2 DD. Diuresed 12 lbs. LHC with significant CAD as below.  Evaluated 11/25/17 in ED with chest pain. Not thouught to be ACS. Troponin 0.0. BNP 1397. He was instructed to double lasix for 3 days and follow up with HF.  He is s/p R transmetatarsal amputation for osteomyelitis.   Today he returns for HF follow up. Says he feels good. Dr. Ouida Roth stopped Christopher Roth and added Trulicity as he was concerned that Christopher Roth would limit wound healing. Worried he is going to lose his job due to ICD. No chest pain, orthopnea or PND. Occasionally SOB due to allergies.   EF 2/21 EF 30-35%  Social: Lives in [place], Wife and Son  ECHO 12/2017 EF 25-30%  cMRI 07/16/16 -> recalculated 08/19/16: EF 41% scar in LAD distribution. No viability   LHC 04/23/16 - Recent total occlusion of the proximal to mid LAD.  - 70% ramus intermedius, 65% ostial circumflex followed by 70% mid circumflex, 75% proximal LAD prior to the origin of a small to moderate size diagonal, and 90% mid PDA. - Mild PAH, PCWP 14 - LVEF 10-15% Hemodynamics RHC Procedural Findings: Hemodynamics (mmHg) RA mean 4 RV 39/1 PA 37/12 PCWP 14 AO 115/78 Cardiac Output (Fick) 5.63 Cardiac Index (Fick) 2.71  Past Medical History:  Diagnosis Date  . Acute systolic congestive heart failure ([place]) 04/22/2016  . 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 Diabetes mellitus with peripheral vascular disease (HCC)    Type II  . Diabetic foot ulcer ([place]) 01/31/2019  . GERD (gastroesophageal reflux disease)   .  Hypertension   . Osteomyelitis of ankle and foot ([place]) 02/22/2019  . PVD (peripheral vascular disease) ([place])   . Tobacco use disorder    intermittent cigar use now    Current Outpatient Medications  Medication Sig Dispense Refill  . albuterol (PROVENTIL HFA;VENTOLIN HFA) 108 (90 Base) MCG/ACT inhaler Inhale 2 puffs into the lungs every 6 (six) hours as needed for wheezing or shortness of breath.    . Alcohol Swabs PADS 1 Package by Does not apply route as needed (with blood glucose checks). 1 each 0  . aspirin EC 81 MG tablet Take 81 mg by mouth daily.    Marland Kitchen atorvastatin (LIPITOR) 80 MG tablet Take 1 tablet (80 mg total) by mouth every evening. 30 tablet 3  . carvedilol (COREG) 6.25 MG tablet Take 1 tablet (6.25 mg total) by mouth 2 (two) times daily with a meal. 180 tablet 3  . clopidogrel (PLAVIX) 75 MG tablet Take 1 tablet (75 mg total) by mouth daily. 30 tablet 11  . Coenzyme Q10 (COQ10) 100 MG CAPS Take 100 mg by mouth daily.    . collagenase (SANTYL) ointment Apply 1 application topically daily. 15 g 2  . dapagliflozin propanediol (FARXIGA) 5 MG TABS tablet Take 5 mg by mouth daily.     Marland Kitchen docusate sodium (COLACE) 100 MG capsule Take 1 capsule (100 mg total) by mouth 2 (two) times daily. 10 capsule 0  . Dulaglutide 0.75 MG/0.5ML SOPN Inject into the skin.    . furosemide (LASIX) 20 MG tablet  Take 1 tablet (20 mg total) by mouth as needed for fluid. (Patient taking differently: Take 20 mg by mouth daily as needed for fluid. ) 30 tablet 11  . glucose blood (CHOICE DM FORA G20 TEST STRIPS) test strip Use as instructed 100 each 11  . glucose monitoring kit (FREESTYLE) monitoring kit 1 each by Does not apply route as needed for other. 1 each 0  . hydrALAZINE (APRESOLINE) 50 MG tablet Take 1.5 tablets (75 mg total) by mouth 3 (three) times daily. 405 tablet 3  . isosorbide mononitrate (IMDUR) 30 MG 24 hr tablet Take 1 tablet (30 mg total) by mouth daily. 90 tablet 3  . Lancets (FREESTYLE)  lancets Use as instructed 100 each 11  . metFORMIN (GLUCOPHAGE) 1000 MG tablet Take 1,000 mg by mouth 2 (two) times daily with a meal.    . povidone-Iodine (BETADINE) 5 % SOLN topical solution Apply 1 application topically 3 (three) times daily as needed for wound care (foot wound).    . sacubitril-valsartan (ENTRESTO) 97-103 MG Take 1 tablet by mouth 2 (two) times daily. 180 tablet 2  . sitaGLIPtin (JANUVIA) 100 MG tablet Take 100 mg by mouth every evening.     . sodium chloride irrigation 0.9 % irrigation Irrigate with 1 application as directed 4 (four) times daily as needed (wound care).    Marland Kitchen spironolactone (ALDACTONE) 25 MG tablet Take 1 tablet (25 mg total) by mouth daily. 30 tablet 1  . TRULICITY 6.20 BT/5.9RC SOPN INJECT 0.5 MLS (0.75MG) SUBCUTANEOUSLY ONCE A WEEK     No current facility-administered medications for this encounter.    Allergies  Allergen Reactions  . Nickel Rash     Social History   Socioeconomic History  . Marital status: Married    Spouse name: Not on file  . Number of children: Not on file  . Years of education: Not on file  . Highest education level: Not on file  Occupational History  . Not on file  Tobacco Use  . Smoking status: Current Some Day Smoker    Packs/day: 1.00    Years: 24.00    Pack years: 24.00    Types: Cigars    Last attempt to quit: 03/27/2016    Years since quitting: 3.4  . Smokeless tobacco: Never Used  . Tobacco comment: h/o cigarettes with ongoing occasional cigar use  Substance and Sexual Activity  . Alcohol use: Yes    Comment: occ  . Drug use: No  . Sexual activity: Not on file  Other Topics Concern  . Not on file  Social History Narrative  . Not on file   Social Determinants of Health   Financial Resource Strain:   . Difficulty of Paying Living Expenses:   Food Insecurity:   . Worried About Charity fundraiser in the Last Year:   . Arboriculturist in the Last Year:   Transportation Needs:   . Lexicographer (Medical):   Marland Kitchen Lack of Transportation (Non-Medical):   Physical Activity:   . Days of Exercise per Week:   . Minutes of Exercise per Session:   Stress:   . Feeling of Stress :   Social Connections:   . Frequency of Communication with Friends and Family:   . Frequency of Social Gatherings with Friends and Family:   . Attends Religious Services:   . Active Member of Clubs or Organizations:   . Attends Archivist Meetings:   Marland Kitchen Marital Status:  Intimate Partner Violence:   . Fear of Current or Ex-Partner:   . Emotionally Abused:   Marland Kitchen Physically Abused:   . Sexually Abused:    Family History  Problem Relation Age of Onset  . Diabetes Mellitus II Mother     Vitals:   08/30/19 0920  BP: (!) 148/82  Pulse: 72  SpO2: 98%  Weight: 98 kg (216 lb)   Wt Readings from Last 3 Encounters:  05/31/19 97.1 kg (214 lb)  02/22/19 93.9 kg (207 lb)  02/09/19 96.2 kg (212 lb)   PHYSICAL EXAM: General:  Well appearing. No resp difficulty HEENT: normal Neck: supple. no JVD. Carotids 2+ bilat; no bruits. No lymphadenopathy or thryomegaly appreciated. Cor: PMI nondisplaced. Regular rate & rhythm. No rubs, gallops or murmurs. Lungs: clear Abdomen: soft, nontender, nondistended. No hepatosplenomegaly. No bruits or masses. Good bowel sounds. Extremities: no cyanosis, clubbing, rash, edema Neuro: alert & orientedx3, cranial nerves grossly intact. moves all 4 extremities w/o difficulty. Affect pleasant   ICD interrogation; HL score 16. No VT. Personally reviewed    ASSESSMENT & PLAN:  1. Chronic combined systolic and diastolic CHF -  ICM with Echo 04/2016 LVEF 25-30%, Grade 2 DD (EF 10-15% by cath) - Cath with chronic occlusion of LAD with no viability on MRI. Moderate non-obstructive CAD elsewhere - cMRI 07/16/16 -> recalculated 08/2016 EF 41%. Anterior scar with no significant viability - S/P [employer] ICD 01/2018 - [employer] Score 5  - Echo 2/21 EF  30-35% - Stable NYHA II. Volume status slightly up on HL. Weight up - Increase carvedilol to 9.375 bid - Continue Entresto to 97/167m BID.  - Continue hydralazine 75 mg three times a day + imdur 30 mg daily (was unable to afford Bidil)  - Continue spiro 25 mg daily.  - Switch Januvia back to [place] with fluid but can take lasix as needed 2. [facility] - Chronic occlusion of LAD with no viability on MRI. Moderate non-obstructive CAD elsewhere - No chest pain - Continue ASA and statin 3. HTN -  Elevated. Adjusting meds as above.  4. DMII - Switch Januvia to Farxiga  5. Tobacco abuse - Quit 2017 6. R Foot Osteomyelitis R foot transmet amputation 2020. 7. Snoring - will check a sleep study   DGlori Bickers MD  9:12 AM

## 2019-08-30 NOTE — Addendum Note (Signed)
Encounter addended by: Scarlette Calico, RN on: 08/30/2019 9:59 AM  Actions taken: Medication long-term status modified, Order list changed, Clinical Note Signed

## 2019-08-30 NOTE — Patient Instructions (Signed)
Increase Carvedilol to 9.375 mg (1 & 1/2 tabs) Twice daily   Stop Januvia  Start Farxiga 10 mg daily  Your provider has recommended that you have a home sleep study.  [employer] is the company that does these test.  They will contact you by phone and must speak with you before they can ship the equipment.  Once they have spoken with you they will send the equipment right to your home with instructions on how to set it up.  Once you have completed the test you just dispose of the equipment, the information is automatically uploaded to [place] via blue-tooth technology.  IF you have any questions or issues with the equipment please call the company directly at [phone].  If your test is positive for sleep apnea and you need a home CPAP machine you will be contacted by Dr Theodosia Blender office [facility]) to set this up.  Please call our office in October to schedule your follow up appointment  If you have any questions or concerns before your next appointment please send [place] a message through [place] or call our office at [phone].  At the [facility], you and your health needs are our priority. As part of our continuing mission to provide you with exceptional heart care, we have created designated Provider Care Teams. These Care Teams include your primary Cardiologist (physician) and Advanced Practice Providers (APPs- Physician Assistants and Nurse Practitioners) who all work together to provide you with the care you need, when you need it.   You may see any of the following providers on your designated Care Team at your next follow up: Marland Kitchen Dr Glori Bickers . Dr Loralie Champagne . Darrick Grinder, NP . Lyda Jester, PA . Audry Riles, PharmD   Please be sure to bring in all your medications bottles to every appointment.

## 2019-08-31 NOTE — Addendum Note (Signed)
Encounter addended by: Scarlette Calico, RN on: 08/31/2019 4:46 PM  Actions taken: Visit diagnoses modified, Order list changed, Diagnosis association updated, Clinical Note Signed

## 2019-08-31 NOTE — Progress Notes (Signed)
Height: 5'8"    Weight: 216 lb BMI: 32.84  Today's Date: 08/30/19  STOP BANG RISK ASSESSMENT S (snore) Have you been told that you snore?     YES   T (tired) Are you often tired, fatigued, or sleepy during the day?   YES  O (obstruction) Do you stop breathing, choke, or gasp during sleep? NO   P (pressure) Do you have or are you being treated for high blood pressure? YES   B (BMI) Is your body index greater than 35 kg/m? NO   A (age) Are you 49 years old or older? NO   N (neck) Do you have a neck circumference greater than 16 inches?      G (gender) Are you a male? YES   TOTAL STOP/BANG "YES" ANSWERS 4                                                                       For Office Use Only              Procedure Order Form    YES to 3+ Stop Bang questions OR two clinical symptoms - patient qualifies for WatchPAT (CPT 95800)      Clinical Notes: Will consult Sleep Specialist and refer for management of therapy due to patient increased risk of Sleep Apnea. Ordering a sleep study due to the following two clinical symptoms: Excessive daytime sleepiness G47.10 / Loud snoring R06.83   Which test do you need, WP1 or WP300???  . Do you have access to a smart device containing the app stores?  If YES, then -->WP1   If NO, then  --->WP300

## 2019-09-02 ENCOUNTER — Telehealth (HOSPITAL_COMMUNITY): Payer: Self-pay | Admitting: *Deleted

## 2019-09-02 ENCOUNTER — Other Ambulatory Visit (HOSPITAL_COMMUNITY): Payer: Self-pay | Admitting: *Deleted

## 2019-09-02 ENCOUNTER — Telehealth: Payer: Self-pay | Admitting: *Deleted

## 2019-09-02 NOTE — Telephone Encounter (Signed)
09/02/2019  Called patient to follow up with him about the [employer] study. He hadn't had a chance to discuss with his wife. Ask me to call him back Monday or Tuesday of the following week.

## 2019-09-02 NOTE — Telephone Encounter (Signed)
Order, OV note, stop bang and demographics all faxed to [employer] at [phone]  

## 2019-09-02 NOTE — Progress Notes (Signed)
itama

## 2019-09-05 ENCOUNTER — Other Ambulatory Visit (HOSPITAL_COMMUNITY): Payer: Self-pay | Admitting: *Deleted

## 2019-09-05 MED ORDER — ISOSORBIDE MONONITRATE ER 30 MG PO TB24: 30.0000 mg | ORAL_TABLET | Freq: Every day | ORAL | 3 refills | Status: DC

## 2019-09-06 ENCOUNTER — Telehealth (HOSPITAL_COMMUNITY): Payer: Self-pay | Admitting: *Deleted

## 2019-09-06 NOTE — Telephone Encounter (Signed)
09/06/2019 called patient and spoke with him, he said he hadn't had a chance to speak with his wife about it. To please call back on Thursday that he will put that on his priority list.

## 2019-09-06 NOTE — Telephone Encounter (Signed)
Pt left VM stating he has not heard anything about scheduling his sleep study. Left Vm for pt that complete order was faxed to [facility] on 4/30. If he has not heard anything by the end of the week to call our office.

## 2019-09-13 NOTE — Telephone Encounter (Signed)
Patient declined to not participate in the study.

## 2019-09-27 ENCOUNTER — Encounter (INDEPENDENT_AMBULATORY_CARE_PROVIDER_SITE_OTHER): Payer: 59 | Admitting: Cardiology

## 2019-09-27 DIAGNOSIS — G4733 Obstructive sleep apnea (adult) (pediatric): Secondary | ICD-10-CM

## 2019-10-16 ENCOUNTER — Ambulatory Visit: Payer: 59

## 2019-10-16 NOTE — Procedures (Signed)
    Sleep Study Report  Patient Information Name: Christopher Roth  ID: [id number] Birth Date: 29-Mar-1971  Age: 49 Gender: Male BMI: 32.7 (W=216 lb, H=5' 8'') Sleep Study Date: 09/27/2019 Referring Physician: Glori Bickers, MD  TEST DESCRIPTION: Home sleep apnea testing was completed using the WatchPat, a Type 1 device, utilizing peripheral arterial tonometry (PAT), chest movement, actigraphy, pulse oximetry, pulse rate, body position and snore. AHI was calculated with apnea and hypopnea using valid sleep time as the denominator. RDI includes apneas, hypopneas, and RERAs. The data acquired and the scoring of sleep and all associated events were performed in accordance with the recommended standards and specifications as outlined in the AASM Manual for the Scoring of Sleep and Associated Events 2.2.0 (2015).  FINDINGS: 1. Mild Obstructive Sleep Apnea with AHI 5.3/hr.  2. No Central Sleep Apnea with pAHIc 0/hr. 3. Oxygen desaturations as low as 86%. 4. Mild snoring was present. O2 sats were < 88% for 26mnutes. 5. Total sleep time was 5 hrs and 1 min. 6. 24.5% of total sleep time was spent in REM sleep.  7. Prolonged sleep onset latency at 35 min.  8. Shortened REM sleep onset latency at 60 min.  9. Total awakenings were 8.   DIAGNOSIS:  Mild Obstructive Sleep Apnea (G47.33)  RECOMMENDATIONS: 1. Clinical correlation of these findings is necessary. The decision to treat obstructive sleep apnea (OSA) is usually based on the presence of apnea symptoms or the presence of associated medical conditions such as Hypertension, Congestive Heart Failure, Atrial Fibrillation or Obesity. The most common symptoms of OSA are snoring, gasping for breath while sleeping, daytime sleepiness and fatigue.   2. Initiating apnea therapy is recommended given the presence of symptoms and/or associated conditions.   Recommend proceeding with one of the following:  a. Auto-CPAP therapy with a pressure range of  5-20cm H2O.   b. An oral appliance (OA) that can be obtained from certain dentists with expertise in sleep medicine. These are primarily of use in non-obese patients with mild and moderate disease.   c. An ENT consultation which may be useful to look for specific causes of obstruction and possible treatment options.   d. If patient is intolerant to PAP therapy, consider referral to ENT for evaluation for hypoglossal nerve stimulator.   3. Close follow-up is necessary to ensure success with CPAP or oral appliance therapy for maximum benefit .  4. A follow-up oximetry study on CPAP is recommended to assess the adequacy of therapy and determine the need for supplemental oxygen or the potential need for Bi-level therapy. An arterial blood gas to determine the adequacy of baseline ventilation and oxygenation should also be considered.  5. Healthy sleep recommendations include: adequate nightly sleep (normal 7-9 hrs/night), avoidance of caffeine after noon and alcohol near bedtime, and maintaining a sleep environment that is cool, dark and quiet.  6. Weight loss for overweight patients is recommended. Even modest amounts of weight loss can significantly improve the severity of sleep apnea.  7. Snoring recommendations include: weight loss where appropriate, side sleeping, and avoidance of alcohol before bed.  8. Operation of motor vehicle or dangerous equipment must be avoided when feeling drowsy, excessively sleepy, or mentally fatigued.  Report prepared by: Signature: TFransico Him MD Diplomat ABSM  Electronically Signed: Oct 16, 2019

## 2019-10-17 ENCOUNTER — Telehealth: Payer: Self-pay | Admitting: *Deleted

## 2019-10-17 DIAGNOSIS — G4733 Obstructive sleep apnea (adult) (pediatric): Secondary | ICD-10-CM

## 2019-10-17 NOTE — Telephone Encounter (Signed)
°  Patient Consent for Virtual Visit         Christopher Roth has provided verbal consent on 10/17/2019 for a virtual visit (video or telephone).   CONSENT FOR VIRTUAL VISIT FOR:  Christopher Roth  By participating in this virtual visit I agree to the following:  I hereby voluntarily request, consent and authorize [facility] and its employed or contracted physicians, physician assistants, nurse practitioners or other licensed health care professionals (the Practitioner), to provide me with telemedicine health care services (the Services") as deemed necessary by the treating Practitioner. I acknowledge and consent to receive the Services by the Practitioner via telemedicine. I understand that the telemedicine visit will involve communicating with the Practitioner through live audiovisual communication technology and the disclosure of certain medical information by electronic transmission. I acknowledge that I have been given the opportunity to request an in-person assessment or other available alternative prior to the telemedicine visit and am voluntarily participating in the telemedicine visit.  I understand that I have the right to withhold or withdraw my consent to the use of telemedicine in the course of my care at any time, without affecting my right to future care or treatment, and that the Practitioner or I may terminate the telemedicine visit at any time. I understand that I have the right to inspect all information obtained and/or recorded in the course of the telemedicine visit and may receive copies of available information for a reasonable fee.  I understand that some of the potential risks of receiving the Services via telemedicine include:   Delay or interruption in medical evaluation due to technological equipment failure or disruption;  Information transmitted may not be sufficient (e.g. poor resolution of images) to allow for appropriate medical decision making by the Practitioner;  and/or   In rare instances, security protocols could fail, causing a breach of personal health information.  Furthermore, I acknowledge that it is my responsibility to provide information about my medical history, conditions and care that is complete and accurate to the best of my ability. I acknowledge that Practitioner's advice, recommendations, and/or decision may be based on factors not within their control, such as incomplete or inaccurate data provided by me or distortions of diagnostic images or specimens that may result from electronic transmissions. I understand that the practice of medicine is not an exact science and that Practitioner makes no warranties or guarantees regarding treatment outcomes. I acknowledge that a copy of this consent can be made available to me via my patient portal ([facility]), or I can request a printed copy by calling the office of [facility].    I understand that my insurance will be billed for this visit.   I have read or had this consent read to me.  I understand the contents of this consent, which adequately explains the benefits and risks of the Services being provided via telemedicine.   I have been provided ample opportunity to ask questions regarding this consent and the Services and have had my questions answered to my satisfaction.  I give my informed consent for the services to be provided through the use of telemedicine in my medical care

## 2019-10-23 ENCOUNTER — Other Ambulatory Visit (HOSPITAL_COMMUNITY): Payer: Self-pay | Admitting: Adult Health

## 2019-10-30 NOTE — Progress Notes (Signed)
Virtual Visit via Telephone Note   This visit type was conducted due to national recommendations for restrictions regarding the COVID-19 Pandemic (e.g. social distancing) in an effort to limit this patient's exposure and mitigate transmission in our community.  Due to his co-morbid illnesses, this patient is at least at moderate risk for complications without adequate follow up.  This format is felt to be most appropriate for this patient at this time.  The patient did not have access to video technology/had technical difficulties with video requiring transitioning to audio format only (telephone).  All issues noted in this document were discussed and addressed.  No physical exam could be performed with this format.  Please refer to the patient's chart for his  consent to telehealth for [facility].   Evaluation Performed:  Follow-up visit  This visit type was conducted due to national recommendations for restrictions regarding the COVID-19 Pandemic (e.g. social distancing).  This format is felt to be most appropriate for this patient at this time.  All issues noted in this document were discussed and addressed.  No physical exam was performed (except for noted visual exam findings with Video Visits).  Please refer to the patient's chart (MyChart message for video visits and phone note for telephone visits) for the patient's consent to telehealth for [facility].  Date:  10/31/2019   ID:  Christopher Roth, DOB 1970/06/17, MRN [id number]  Patient Location:  Home  Provider location:   [place]  PCP:  Gregor Hams, FNP  Cardiologist:  Glori Bickers, MD Sleep Medicine:  Fransico Him, MD Electrophysiologist:  None   Chief Complaint:  OSA  History of Present Illness:    Christopher Roth is a 49 y.o. male who presents via audio/video conferencing for a telehealth visit today.    This is a 49yo male with a hx of chronic systolic CHF, DM2 and PVD who was referred for home sleep study.   His sleep study showed minimal OSA with ah AHI of 5.3/hr with no central events, but mild snoring and nocturnal hypoxemia with O2 sats < 88% for 6 minutes, shortened REM sleep latency.  He is now here to discuss results.    He tells me that he wakes up feeling fatigued in the am and feels like he has not slept.  He goes to bed at 9pm but sometimes it is earlier and gets up around 5-6am but sometimes has to be back at work at 2am.  Even when he sleeps a good 8 hours he feels sleepy in the am.  He sometimes wakes up snoring and sometimes awakens gasping for breath.  He easily falls asleep sitting down but never fell asleep driving.  He wakes up in the am with a dry a mouth.  He admits to waking up at night about 2-3 times nightly to go to the bathroom.  He has no am HAs.    The patient does not have symptoms concerning for COVID-19 infection (fever, chills, cough, or new shortness of breath).    Prior CV studies:   The following studies were reviewed today:  Home Sleep study  Past Medical History:  Diagnosis Date  . Acute systolic congestive heart failure ([place]) 04/22/2016  . 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 Diabetes mellitus with peripheral vascular disease (HCC)    Type II  . Diabetic foot ulcer ([place]) 01/31/2019  . GERD (gastroesophageal reflux disease)   . Hypertension   . 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 PVD (peripheral vascular disease) ([place])   . Tobacco use disorder    intermittent cigar use now   Past Surgical History:  Procedure Laterality Date  . ABDOMINAL AORTOGRAM W/LOWER EXTREMITY Bilateral 01/18/2019   Procedure: ABDOMINAL AORTOGRAM W/LOWER EXTREMITY;  Surgeon: Serafina Mitchell, MD;  Location: [facility];  Service: Cardiovascular;  Laterality: Bilateral;  . 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 AMPUTATION TOE Right 02/11/2019   Procedure:  AMPUTATION OF TRANSMETATARSAL OF RIGHT FOOT.;  Surgeon: Felipa Furnace, DPM;  Location: [facility];  Service: Podiatry;  Laterality: Right;  . CARDIAC CATHETERIZATION N/A 04/23/2016   Procedure: Right/Left Heart Cath and Coronary Angiography;  Surgeon: Belva Crome, MD;  Location: [facility];  Service: Cardiovascular;  Laterality: N/A;  . ICD IMPLANT N/A 01/27/2018   Procedure: ICD IMPLANT;  Surgeon: Deboraha Sprang, MD;  Location: [facility];  Service: Cardiovascular;  Laterality: N/A;  . INCISION AND DRAINAGE PERIRECTAL ABSCESS Right 11/17/2014   Procedure: debridement of right perianal and groin wound;  Surgeon: Johnathan Hausen, MD;  Location: [facility];  Service: General;  Laterality: Right;  . PERIPHERAL VASCULAR BALLOON ANGIOPLASTY Right 01/18/2019   Procedure: PERIPHERAL VASCULAR BALLOON ANGIOPLASTY;  Surgeon: Serafina Mitchell, MD;  Location: [facility];  Service: Cardiovascular;  Laterality: Right;  posterior tibial, peroneal     Current Meds  Medication Sig  . albuterol (PROVENTIL HFA;VENTOLIN HFA) 108 (90 Base) MCG/ACT inhaler Inhale 2 puffs into the lungs every 6 (six) hours as needed for wheezing or shortness of breath.  . Alcohol Swabs PADS 1 Package by Does not apply route as needed (with blood glucose checks).  Marland Kitchen aspirin EC 81 MG tablet Take 81 mg by mouth daily.  Marland Kitchen atorvastatin (LIPITOR) 80 MG tablet TAKE 1 TABLET EVERY DAY  . carvedilol (COREG) 6.25 MG tablet Take 1.5 tablets (9.375 mg total) by mouth 2 (two) times daily with a meal.  . clopidogrel (PLAVIX) 75 MG tablet Take 1 tablet (75 mg total) by mouth daily.  . dapagliflozin propanediol (FARXIGA) 10 MG TABS tablet Take 10 mg by mouth daily before breakfast.  . furosemide (LASIX) 20 MG tablet Take 20 mg by mouth daily as needed for fluid or edema.  Marland Kitchen glucose blood (CHOICE DM FORA G20 TEST STRIPS) test strip Use as instructed  . glucose monitoring kit (FREESTYLE) monitoring kit 1 each by Does not apply route as  needed for other.  . hydrALAZINE (APRESOLINE) 50 MG tablet Take 1.5 tablets (75 mg total) by mouth 3 (three) times daily.  . isosorbide mononitrate (IMDUR) 30 MG 24 hr tablet Take 1 tablet (30 mg total) by mouth daily.  . Lancets (FREESTYLE) lancets Use as instructed  . metFORMIN (GLUCOPHAGE) 1000 MG tablet Take 1,000 mg by mouth 2 (two) times daily with a meal.  . povidone-Iodine (BETADINE) 5 % SOLN topical solution Apply 1 application topically 3 (three) times daily as needed for wound care (foot wound).  . sacubitril-valsartan (ENTRESTO) 97-103 MG Take 1 tablet by mouth 2 (two) times daily.  . sodium chloride irrigation 0.9 % irrigation Irrigate with 1 application as directed 4 (four) times daily as needed (wound care).  Marland Kitchen spironolactone (ALDACTONE) 25 MG tablet TAKE 1 TABLET EVERY DAY  . TRULICITY 2.83 MO/2.9UT SOPN INJECT 0.5 MLS (0.75MG) SUBCUTANEOUSLY ONCE A WEEK     Allergies:   Nickel   Social History  Tobacco Use  . Smoking status: Current Some Day Smoker    Packs/day: 1.00    Years: 24.00    Pack years: 24.00    Types: Cigars    Last attempt to quit: 03/27/2016    Years since quitting: 3.5  . Smokeless tobacco: Never Used  . Tobacco comment: h/o cigarettes with ongoing occasional cigar use  Vaping Use  . Vaping Use: Never used  Substance Use Topics  . Alcohol use: Yes    Comment: occ  . Drug use: No     Family Hx: The patient's family history includes Diabetes Mellitus II in his mother.  ROS:   Please see the history of present illness.     All other systems reviewed and are negative.   Labs/Other Tests and Data Reviewed:    Recent Labs: 02/22/2019: Hemoglobin 9.8; Platelets 389 05/31/2019: BUN 17; Creatinine, Ser 1.28; Potassium 3.8; Sodium 140   Recent Lipid Panel Lab Results  Component Value Date/Time   CHOL 193 04/22/2016 04:54 AM   TRIG 80 04/22/2016 04:54 AM   HDL 52 04/22/2016 04:54 AM   CHOLHDL 3.7 04/22/2016 04:54 AM   LDLCALC 125 (H)  04/22/2016 04:54 AM   LDLDIRECT 209.0 08/04/2007 02:47 PM    Wt Readings from Last 3 Encounters:  10/31/19 224 lb (101.6 kg)  08/30/19 216 lb (98 kg)  05/31/19 214 lb (97.1 kg)     Objective:    Vital Signs:  Ht _0  (1.727 m)   Wt 224 lb (101.6 kg)   BMI 34.06 kg/m     ASSESSMENT & PLAN:    1.  OSA -sleep study showed mild OSA with an AHI of 5.3/hr with mild snoring and mild nocturnal hypoxemia -the pathophysiology of OSA was discussed at length with the patient today and the role it may play in his CHF -He has significant excessive daytime sleepiness and awakens gasping for breath and snoring, along with CHF, therefore I think he will benefit from PAP therapy -Order ResMed CPAP on auto CPAP from 4-15cm H2O and heated humidity and mask of choice -Followup with me 8 weeks after starting therapy  2.  HTN -BP controlled -continue Carvedilol 9.28m BID, Imdur 311mdaily, Entresto 97-10342mID and spiro 74m74mily  3.  Obesity -I have encouraged him to get into a routine exercise program and cut back on carbs and portions.   COVID-19 Education: The signs and symptoms of COVID-19 were discussed with the patient and how to seek care for testing (follow up with PCP or arrange E-visit).  The importance of social distancing was discussed today.  Patient Risk:   After full review of this patient's clinical status, I feel that they are at least moderate risk at this time.  Time:   Today, I have spent 20 minutes on telemedicine discussing medical problems including OSA, Obesity and reviewing patient's chart including home sleep study.  Medication Adjustments/Labs and Tests Ordered: Current medicines are reviewed at length with the patient today.  Concerns regarding medicines are outlined above.  Tests Ordered: No orders of the defined types were placed in this encounter.  Medication Changes: No orders of the defined types were placed in this encounter.   Disposition:  Follow  up in 8 weeks after getting his device Signed, TracFransico Him  10/31/2019 9:45 AM    [facility]

## 2019-10-31 ENCOUNTER — Encounter: Payer: Self-pay | Admitting: Cardiology

## 2019-10-31 ENCOUNTER — Telehealth (INDEPENDENT_AMBULATORY_CARE_PROVIDER_SITE_OTHER): Payer: 59 | Admitting: Cardiology

## 2019-10-31 VITALS — Ht 68.0 in | Wt 224.0 lb

## 2019-10-31 DIAGNOSIS — G4733 Obstructive sleep apnea (adult) (pediatric): Secondary | ICD-10-CM | POA: Diagnosis not present

## 2019-10-31 DIAGNOSIS — I10 Essential (primary) hypertension: Secondary | ICD-10-CM | POA: Diagnosis not present

## 2019-10-31 DIAGNOSIS — E669 Obesity, unspecified: Secondary | ICD-10-CM | POA: Diagnosis not present

## 2019-10-31 NOTE — Telephone Encounter (Addendum)
Order placed to [facility] via fax to [phone].   DME selection is [facility]. Patient understands he will be contacted by [facility] to set up his cpap. Patient understands to call if [facility] does not contact him with new setup in a timely manner. Patient understands they will be called once confirmation has been received from [facility] that they have received their new machine to schedule 10 week follow up appointment.  [facility] notified of new cpap order  Please add to airview Patient was grateful for the call and thanked me.

## 2019-10-31 NOTE — Addendum Note (Signed)
Addended by: Freada Bergeron on: 10/31/2019 11:12 AM   Modules accepted: Orders

## 2019-10-31 NOTE — Telephone Encounter (Signed)
PER DR TURNER: Order ResMed CPAP on auto CPAP from 4-15cm H2O and heated humidity and mask of choice -Followup with me 8 weeks after starting therapy

## 2019-11-07 ENCOUNTER — Other Ambulatory Visit: Payer: Self-pay | Admitting: Surgery

## 2019-11-08 ENCOUNTER — Telehealth: Payer: Self-pay | Admitting: Cardiology

## 2019-11-08 NOTE — Telephone Encounter (Signed)
Return Call: Reached out to patient and informed him his insurance approval has not came back to the dme we sent it out to. I explained when it comes back the dme will call him to come out to be fitted and take his unit home.

## 2019-11-08 NOTE — Telephone Encounter (Signed)
Christopher Roth is calling stating Dr. Radford Pax advised him to call if he did not hear anything in regards to his CPAP by Friday, 11/04/19. Please advise.

## 2019-11-09 ENCOUNTER — Ambulatory Visit (INDEPENDENT_AMBULATORY_CARE_PROVIDER_SITE_OTHER): Payer: 59 | Admitting: *Deleted

## 2019-11-09 DIAGNOSIS — I255 Ischemic cardiomyopathy: Secondary | ICD-10-CM | POA: Diagnosis not present

## 2019-11-10 LAB — CUP PACEART REMOTE DEVICE CHECK
Battery Remaining Longevity: 168 mo
Battery Remaining Percentage: 100 %
Brady Statistic RV Percent Paced: 0 %
Date Time Interrogation Session: 20210707173200
HighPow Impedance: 60 Ohm
Implantable Lead Implant Date: 20190925
Implantable Lead Location: 753860
Implantable Lead Model: 293
Implantable Lead Serial Number: 441633
Implantable Pulse Generator Implant Date: 20190925
Lead Channel Impedance Value: 475 Ohm
Lead Channel Setting Pacing Amplitude: 2.5 V
Lead Channel Setting Pacing Pulse Width: 0.4 ms
Lead Channel Setting Sensing Sensitivity: 0.5 mV
Pulse Gen Serial Number: 244469

## 2019-11-11 NOTE — Progress Notes (Signed)
Remote ICD transmission.   

## 2019-12-23 ENCOUNTER — Other Ambulatory Visit (HOSPITAL_COMMUNITY): Payer: Self-pay | Admitting: *Deleted

## 2019-12-23 MED ORDER — FUROSEMIDE 20 MG PO TABS: 20.0000 mg | ORAL_TABLET | Freq: Every day | ORAL | 3 refills | Status: DC | PRN

## 2019-12-28 ENCOUNTER — Other Ambulatory Visit (HOSPITAL_COMMUNITY): Payer: Self-pay | Admitting: Cardiology

## 2019-12-28 MED ORDER — FUROSEMIDE 20 MG PO TABS: 20.0000 mg | ORAL_TABLET | Freq: Every day | ORAL | 3 refills | Status: DC | PRN

## 2020-01-17 DIAGNOSIS — M79676 Pain in unspecified toe(s): Secondary | ICD-10-CM

## 2020-01-20 ENCOUNTER — Other Ambulatory Visit: Payer: Self-pay

## 2020-01-20 ENCOUNTER — Ambulatory Visit: Payer: 59 | Admitting: Podiatry

## 2020-01-20 ENCOUNTER — Encounter: Payer: Self-pay | Admitting: Podiatry

## 2020-01-20 DIAGNOSIS — L84 Corns and callosities: Secondary | ICD-10-CM | POA: Diagnosis not present

## 2020-01-20 DIAGNOSIS — E1165 Type 2 diabetes mellitus with hyperglycemia: Secondary | ICD-10-CM | POA: Diagnosis not present

## 2020-01-20 DIAGNOSIS — L989 Disorder of the skin and subcutaneous tissue, unspecified: Secondary | ICD-10-CM | POA: Diagnosis not present

## 2020-01-25 ENCOUNTER — Encounter: Payer: Self-pay | Admitting: Podiatry

## 2020-01-25 NOTE — Progress Notes (Signed)
Subjective:  Patient ID: Christopher Roth, male    DOB: Jul 14, 1970,  MRN: [id number]  Chief Complaint  Patient presents with  . Diabetic Ulcer    uncomfortable when wearing a shoe but not painful     49 y.o. male presents with the above complaint.  Patient presents with complaint of left submetatarsal 1 preulcerative callus.  Patient states that he went to get evaluated make sure that there is nothing going on.  His right transmetatarsal amputation site is doing well without any acute breakdown.  For now he was concerned about the left side.  He is a diabetic with last A1c of 7.7.  He denies any other acute complaints.  He would like to discuss debriding it down to make sure that there is no wound present.   Review of Systems: Negative except as noted in the HPI. Denies N/V/F/Ch.  Past Medical History:  Diagnosis Date  . Acute systolic congestive heart failure ([place]) 04/22/2016  . 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 Diabetes mellitus with peripheral vascular disease (HCC)    Type II  . Diabetic foot ulcer ([place]) 01/31/2019  . GERD (gastroesophageal reflux disease)   . Hypertension   . Osteomyelitis of ankle and foot ([place]) 02/22/2019  . PVD (peripheral vascular disease) ([place])   . Tobacco use disorder    intermittent cigar use now    Current Outpatient Medications:  .  albuterol (PROVENTIL HFA;VENTOLIN HFA) 108 (90 Base) MCG/ACT inhaler, Inhale 2 puffs into the lungs every 6 (six) hours as needed for wheezing or shortness of breath., Disp: , Rfl:  .  Alcohol Swabs PADS, 1 Package by Does not apply route as needed (with blood glucose checks)., Disp: 1 each, Rfl: 0 .  aspirin EC 81 MG tablet, Take 81 mg by mouth daily., Disp: , Rfl:  .  atorvastatin (LIPITOR) 80 MG tablet, TAKE 1 TABLET EVERY DAY, Disp: 90 tablet, Rfl: 3 .  carvedilol (COREG) 6.25 MG tablet, Take 1.5 tablets (9.375 mg total) by mouth 2 (two) times daily with a meal., Disp:  270 tablet, Rfl: 3 .  clopidogrel (PLAVIX) 75 MG tablet, TAKE 1 TABLET EVERY DAY, Disp: 90 tablet, Rfl: 1 .  dapagliflozin propanediol (FARXIGA) 10 MG TABS tablet, Take 10 mg by mouth daily before breakfast., Disp: 30 tablet, Rfl: 6 .  furosemide (LASIX) 20 MG tablet, Take 1 tablet (20 mg total) by mouth daily as needed for fluid or edema., Disp: 30 tablet, Rfl: 3 .  glucose blood (CHOICE DM FORA G20 TEST STRIPS) test strip, Use as instructed, Disp: 100 each, Rfl: 11 .  glucose monitoring kit (FREESTYLE) monitoring kit, 1 each by Does not apply route as needed for other., Disp: 1 each, Rfl: 0 .  hydrALAZINE (APRESOLINE) 50 MG tablet, Take 1.5 tablets (75 mg total) by mouth 3 (three) times daily., Disp: 405 tablet, Rfl: 3 .  isosorbide mononitrate (IMDUR) 30 MG 24 hr tablet, Take 1 tablet (30 mg total) by mouth daily., Disp: 90 tablet, Rfl: 3 .  Lancets (FREESTYLE) lancets, Use as instructed, Disp: 100 each, Rfl: 11 .  metFORMIN (GLUCOPHAGE) 1000 MG tablet, Take 1,000 mg by mouth 2 (two) times daily with a meal., Disp: , Rfl:  .  povidone-Iodine (BETADINE) 5 % SOLN topical solution, Apply 1 application topically 3 (three) times daily as needed for wound care (foot wound)., Disp: , Rfl:  .  sacubitril-valsartan (ENTRESTO) 97-103 MG, Take 1 tablet by mouth 2 (two) times daily., Disp:  180 tablet, Rfl: 2 .  sodium chloride irrigation 0.9 % irrigation, Irrigate with 1 application as directed 4 (four) times daily as needed (wound care)., Disp: , Rfl:  .  spironolactone (ALDACTONE) 25 MG tablet, TAKE 1 TABLET EVERY DAY, Disp: 90 tablet, Rfl: 3 .  TRULICITY 4.49 EE/1.0OF SOPN, INJECT 0.5 MLS (0.75MG) SUBCUTANEOUSLY ONCE A WEEK, Disp: , Rfl:   Social History   Tobacco Use  Smoking Status Current Some Day Smoker  . Packs/day: 1.00  . Years: 24.00  . Pack years: 24.00  . Types: Cigars  . Last attempt to quit: 03/27/2016  . Years since quitting: 3.8  Smokeless Tobacco Never Used  Tobacco Comment   h/o  cigarettes with ongoing occasional cigar use    Allergies  Allergen Reactions  . Nickel Rash   Objective:  There were no vitals filed for this visit. There is no height or weight on file to calculate BMI. Constitutional Well developed. Well nourished.  Vascular Dorsalis pedis pulses palpable bilaterally. Posterior tibial pulses palpable bilaterally. Capillary refill normal to all digits.  No cyanosis or clubbing noted. Pedal hair growth normal.  Neurologic Normal speech. Oriented to person, place, and time. Epicritic sensation to light touch grossly present bilaterally.  Dermatologic  hyperkeratotic lesion noted submetatarsal 1 on the left side without any concern for underlying wound.  No clinical signs of infection noted.  No pinpoint bleeding noted.  Orthopedic: Normal joint ROM without pain or crepitus bilaterally. No visible deformities. No bony tenderness.   Radiographs: None Assessment:   1. Uncontrolled type 2 diabetes mellitus with hyperglycemia ([place])   2. Pre-ulcerative calluses   3. Benign skin lesion    Plan:  Patient was evaluated and treated and all questions answered.  Left submetatarsal 1 preulcerative lesion/callus -I explained to the patient the etiology of callus formation various treatment options were extensively discussed.  I discussed with him the importance of glucose management and diet control to help lower the A1c which will give him the best optimal outcome to prevent ulceration.  Patient states understanding will work on that.  At this time using chisel blade and handle the lesion was debrided down to healthy striated tissue.  No underlying ulceration noted.  No pinpoint bleeding noted. -Today offloading pad was dispensed to take the pressure off of the first metatarsal since especially while at work.  I encouraged him to continue using it.  Patient states understanding.  I have asked him to come back and see me if there is pain associated with or if  there is an open of ulcer.  Patient states understanding  No follow-ups on file.

## 2020-03-01 ENCOUNTER — Ambulatory Visit (INDEPENDENT_AMBULATORY_CARE_PROVIDER_SITE_OTHER): Payer: 59

## 2020-03-01 DIAGNOSIS — I255 Ischemic cardiomyopathy: Secondary | ICD-10-CM | POA: Diagnosis not present

## 2020-03-01 LAB — CUP PACEART REMOTE DEVICE CHECK
Battery Remaining Longevity: 180 mo
Battery Remaining Percentage: 100 %
Brady Statistic RV Percent Paced: 0 %
Date Time Interrogation Session: 20211027182900
HighPow Impedance: 56 Ohm
Implantable Lead Implant Date: 20190925
Implantable Lead Location: 753860
Implantable Lead Model: 293
Implantable Lead Serial Number: 441633
Implantable Pulse Generator Implant Date: 20190925
Lead Channel Impedance Value: 468 Ohm
Lead Channel Setting Pacing Amplitude: 2.5 V
Lead Channel Setting Pacing Pulse Width: 0.4 ms
Lead Channel Setting Sensing Sensitivity: 0.5 mV
Pulse Gen Serial Number: 244469

## 2020-03-06 NOTE — Progress Notes (Signed)
Remote ICD transmission.   

## 2020-04-20 ENCOUNTER — Ambulatory Visit: Payer: 59 | Admitting: Podiatry

## 2020-05-03 ENCOUNTER — Other Ambulatory Visit (HOSPITAL_COMMUNITY): Payer: Self-pay | Admitting: Internal Medicine

## 2020-05-21 NOTE — Progress Notes (Signed)
Patient did not show for visit. Note left for templating purposes only.      Advanced Heart Failure Clinic Note   Referring Physician: Crenshaw Primary Care: Dr. Ouida Sills, MD at [facility] Primary Cardiologist: Dr Christopher Roth HF: Dr. Haroldine Roth  HPI: Christopher Roth is a 50 y.o. male with HTN, DM2, Tobacco abuse, CAD, and systolic CHF. S/P [employer] ICD 01/2018.   Admitted 12/17 with new onest HF. Echo EF 25-30%, Grade 2 DD. Diuresed 12 lbs. LHC recent occlusion of LAD.   cMRI 07/16/16 -> recalculated 08/19/16: EF 41% scar in LAD distribution. No viability  EF 2/21 EF 30-35%  He is s/p R transmetatarsal amputation for osteomyelitis.   Today he returns for HF follow up. Says he feels good. Dr. Ouida Roth stopped Christopher Roth and added Trulicity as he was concerned that Christopher Roth would limit wound healing. Worried he is going to lose his job due to ICD. No chest pain, orthopnea or PND. Occasionally SOB due to allergies.   Cardiac studies:  LHC 04/23/16 - Recent total occlusion of the proximal to mid LAD.  - 70% ramus intermedius, 65% ostial circumflex followed by 70% mid circumflex, 75% proximal LAD prior to the origin of a small to moderate size diagonal, and 90% mid PDA. - Mild PAH, PCWP 14 - LVEF 10-15% Hemodynamics RHC Procedural Findings: Hemodynamics (mmHg) RA mean 4 RV 39/1 PA 37/12 PCWP 14 AO 115/78 Cardiac Output (Fick) 5.63 Cardiac Index (Fick) 2.71  Past Medical History:  Diagnosis Date   Acute systolic congestive heart failure ([place]) 04/22/2016   AICD (automatic cardioverter/defibrillator) present 01/27/2018   [employer]   Diabetes mellitus with peripheral vascular disease ([place])    Type II   Diabetic foot ulcer ([place]) 01/31/2019   GERD (gastroesophageal reflux disease)    Hypertension    Osteomyelitis of ankle and foot ([place]) 02/22/2019   PVD (peripheral vascular disease) ([place])    Tobacco use disorder    intermittent cigar use now    Current Outpatient  Medications  Medication Sig Dispense Refill   albuterol (PROVENTIL HFA;VENTOLIN HFA) 108 (90 Base) MCG/ACT inhaler Inhale 2 puffs into the lungs every 6 (six) hours as needed for wheezing or shortness of breath.     Alcohol Swabs PADS 1 Package by Does not apply route as needed (with blood glucose checks). 1 each 0   aspirin EC 81 MG tablet Take 81 mg by mouth daily.     atorvastatin (LIPITOR) 80 MG tablet TAKE 1 TABLET EVERY DAY 90 tablet 3   carvedilol (COREG) 6.25 MG tablet Take 1.5 tablets (9.375 mg total) by mouth 2 (two) times daily with a meal. 270 tablet 3   clopidogrel (PLAVIX) 75 MG tablet TAKE 1 TABLET EVERY DAY 90 tablet 1   FARXIGA 10 MG TABS tablet TAKE 1 TABLET BY MOUTH EVERY DAY BEFORE BREAKFAST D/C JARDIANCE 30 tablet 6   furosemide (LASIX) 20 MG tablet Take 1 tablet (20 mg total) by mouth daily as needed for fluid or edema. 30 tablet 3   glucose blood (CHOICE DM FORA G20 TEST STRIPS) test strip Use as instructed 100 each 11   glucose monitoring kit (FREESTYLE) monitoring kit 1 each by Does not apply route as needed for other. 1 each 0   hydrALAZINE (APRESOLINE) 50 MG tablet Take 1.5 tablets (75 mg total) by mouth 3 (three) times daily. 405 tablet 3   isosorbide mononitrate (IMDUR) 30 MG 24 hr tablet Take 1 tablet (30 mg total) by mouth daily. [place]  tablet 3   Lancets (FREESTYLE) lancets Use as instructed 100 each 11   metFORMIN (GLUCOPHAGE) 1000 MG tablet Take 1,000 mg by mouth 2 (two) times daily with a meal.     povidone-Iodine (BETADINE) 5 % SOLN topical solution Apply 1 application topically 3 (three) times daily as needed for wound care (foot wound).     sacubitril-valsartan (ENTRESTO) 97-103 MG Take 1 tablet by mouth 2 (two) times daily. 180 tablet 2   sodium chloride irrigation 0.9 % irrigation Irrigate with 1 application as directed 4 (four) times daily as needed (wound care).     spironolactone (ALDACTONE) 25 MG tablet TAKE 1 TABLET EVERY DAY 90 tablet 3   TRULICITY 4.78  GN/5.6OZ SOPN INJECT 0.5 MLS (0.75MG) SUBCUTANEOUSLY ONCE A WEEK     No current facility-administered medications for this encounter.    Allergies  Allergen Reactions   Nickel Rash     Social History   Socioeconomic History   Marital status: Married    Spouse name: Not on file   Number of children: Not on file   Years of education: Not on file   Highest education level: Not on file  Occupational History   Not on file  Tobacco Use   Smoking status: Current Some Day Smoker    Packs/day: 1.00    Years: 24.00    Pack years: 24.00    Types: Cigars    Last attempt to quit: 03/27/2016    Years since quitting: 4.1   Smokeless tobacco: Never Used   Tobacco comment: h/o cigarettes with ongoing occasional cigar use  Vaping Use   Vaping Use: Never used  Substance and Sexual Activity   Alcohol use: Yes    Comment: occ   Drug use: No   Sexual activity: Not on file  Other Topics Concern   Not on file  Social History Narrative   Not on file   Social Determinants of Health   Financial Resource Strain: Not on file  Food Insecurity: Not on file  Transportation Needs: Not on file  Physical Activity: Not on file  Stress: Not on file  Social Connections: Not on file  Intimate Partner Violence: Not on file   Family History  Problem Relation Age of Onset   Diabetes Mellitus II Mother     There were no vitals filed for this visit. Wt Readings from Last 3 Encounters:  10/31/19 101.6 kg (224 lb)  08/30/19 98 kg (216 lb)  05/31/19 97.1 kg (214 lb)   PHYSICAL EXAM: General:  Well appearing. No resp difficulty HEENT: normal Neck: supple. no JVD. Carotids 2+ bilat; no bruits. No lymphadenopathy or thryomegaly appreciated. Cor: PMI nondisplaced. Regular rate & rhythm. No rubs, gallops or murmurs. Lungs: clear Abdomen: soft, nontender, nondistended. No hepatosplenomegaly. No bruits or masses. Good bowel sounds. Extremities: no cyanosis, clubbing, rash, edema Neuro: alert &  orientedx3, cranial nerves grossly intact. moves all 4 extremities w/o difficulty. Affect pleasant  ICD interrogation; HL score 16. No VT. Personally reviewed    ASSESSMENT & PLAN:  1. Chronic combined systolic and diastolic CHF -  ICM with Echo 04/2016 LVEF 25-30%, Grade 2 DD (EF 10-15% by cath) - Cath with chronic occlusion of LAD with no viability on MRI. Moderate non-obstructive CAD elsewhere - cMRI 07/16/16 -> recalculated 08/2016 EF 41%. Anterior scar with no significant viability - S/P [employer] ICD 01/2018 - [employer] Score 5  - Echo 2/21 EF 30-35% - Stable NYHA II. Volume status slightly up  on HL. Weight up - Continue carvedilol 9.375 bid - Continue Entresto 97/178m BID.  - Continue hydralazine 75 mg three times a day + imdur 30 mg daily (was unable to afford Bidil)  - Continue spiro 25 mg daily.  - Continue Farxiga 10  [facility] Chronic occlusion of LAD with no viability on MRI. Moderate non-obstructive CAD elsewhere - No chest pain - Continue ASA and statin  3. HTN -  Elevated. Adjusting meds as above.   4. DMII - Continue Farxiga   5. Tobacco abuse - Quit 2017  6. R Foot Osteomyelitis - s/p R foot transmet amputation 2020.  7. OSA - mild by PSG (6/21) AHI 5.3. - On CPAP. Followed by Dr. TArn Medal MD  9:17 PM

## 2020-05-22 ENCOUNTER — Inpatient Hospital Stay (HOSPITAL_COMMUNITY)
Admission: RE | Admit: 2020-05-22 | Discharge: 2020-05-22 | Disposition: A | Discharge status: Home / Self Care | Payer: 59 | Source: Ambulatory Visit | Attending: Internal Medicine | Admitting: Internal Medicine

## 2020-05-31 ENCOUNTER — Other Ambulatory Visit (HOSPITAL_COMMUNITY): Payer: Self-pay | Admitting: *Deleted

## 2020-05-31 ENCOUNTER — Other Ambulatory Visit (HOSPITAL_COMMUNITY): Payer: Self-pay | Admitting: Internal Medicine

## 2020-05-31 ENCOUNTER — Ambulatory Visit (INDEPENDENT_AMBULATORY_CARE_PROVIDER_SITE_OTHER): Payer: 59

## 2020-05-31 DIAGNOSIS — I255 Ischemic cardiomyopathy: Secondary | ICD-10-CM | POA: Diagnosis not present

## 2020-06-01 LAB — CUP PACEART REMOTE DEVICE CHECK
Battery Remaining Longevity: 156 mo
Battery Remaining Percentage: 100 %
Brady Statistic RV Percent Paced: 0 %
Date Time Interrogation Session: 20220127051000
HighPow Impedance: 59 Ohm
Implantable Lead Implant Date: 20190925
Implantable Lead Location: 753860
Implantable Lead Model: 293
Implantable Lead Serial Number: 441633
Implantable Pulse Generator Implant Date: 20190925
Lead Channel Impedance Value: 493 Ohm
Lead Channel Setting Pacing Amplitude: 2.5 V
Lead Channel Setting Pacing Pulse Width: 0.4 ms
Lead Channel Setting Sensing Sensitivity: 0.5 mV
Pulse Gen Serial Number: 244469

## 2020-06-04 ENCOUNTER — Other Ambulatory Visit (HOSPITAL_COMMUNITY): Payer: Self-pay | Admitting: Internal Medicine

## 2020-06-05 ENCOUNTER — Other Ambulatory Visit (HOSPITAL_COMMUNITY): Payer: Self-pay | Admitting: Internal Medicine

## 2020-06-10 NOTE — Progress Notes (Signed)
Remote ICD transmission.   

## 2020-06-25 ENCOUNTER — Other Ambulatory Visit (HOSPITAL_COMMUNITY): Payer: Self-pay | Admitting: Adult Health

## 2020-07-18 ENCOUNTER — Other Ambulatory Visit: Payer: Self-pay | Admitting: Vascular Surgery

## 2020-07-18 NOTE — Telephone Encounter (Signed)
Last intervention from [facility] was 2020 - to be on Plavix a minimum of 3 months. Would need evaluation appt for further refills.

## 2020-08-01 ENCOUNTER — Other Ambulatory Visit: Payer: Self-pay | Admitting: *Deleted

## 2020-08-01 DIAGNOSIS — I739 Peripheral vascular disease, unspecified: Secondary | ICD-10-CM

## 2020-08-10 ENCOUNTER — Ambulatory Visit (INDEPENDENT_AMBULATORY_CARE_PROVIDER_SITE_OTHER): Payer: 59 | Admitting: Vascular Surgery

## 2020-08-10 ENCOUNTER — Encounter: Payer: Self-pay | Admitting: Vascular Surgery

## 2020-08-10 ENCOUNTER — Ambulatory Visit (HOSPITAL_COMMUNITY)
Admission: RE | Admit: 2020-08-10 | Discharge: 2020-08-10 | Disposition: A | Discharge status: Home / Self Care | Payer: 59 | Source: Ambulatory Visit | Attending: Vascular Surgery | Admitting: Vascular Surgery

## 2020-08-10 ENCOUNTER — Ambulatory Visit (INDEPENDENT_AMBULATORY_CARE_PROVIDER_SITE_OTHER)
Admission: RE | Admit: 2020-08-10 | Discharge: 2020-08-10 | Disposition: A | Discharge status: Home / Self Care | Payer: 59 | Source: Ambulatory Visit | Attending: Vascular Surgery | Admitting: Vascular Surgery

## 2020-08-10 ENCOUNTER — Other Ambulatory Visit: Payer: Self-pay

## 2020-08-10 VITALS — BP 148/90 | HR 59 | Temp 97.7°F | Resp 20 | Ht 68.0 in | Wt 211.0 lb

## 2020-08-10 DIAGNOSIS — I739 Peripheral vascular disease, unspecified: Secondary | ICD-10-CM

## 2020-08-10 NOTE — Progress Notes (Signed)
Patient ID: Christopher Roth, male   DOB: [birth year], 50 y.o.   MRN: [id number]  Reason for Consult: Follow-up   Referred by Gregor Hams, FNP  Subjective:     HPI:  Christopher Roth is a 50 y.o. male history of diabetes, hypertension and previous right lower extremity endovascular intervention subsequently healed right transmetatarsal amputation.  He is currently on aspirin no longer on Plavix.  He does smoke half pack per day.  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  Has no tissue loss or ulceration at this time.  Denies any history of stroke, TIA or amaurosis.  Does have a history of a defibrillator.  No complaints related to today's visit.  Past Medical History:  Diagnosis Date  . Acute systolic congestive heart failure ([place]) 04/22/2016  . 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 Diabetes mellitus with peripheral vascular disease (HCC)    Type II  . Diabetic foot ulcer ([place]) 01/31/2019  . GERD (gastroesophageal reflux disease)   . Hypertension   . Osteomyelitis of ankle and foot ([place]) 02/22/2019  . PVD (peripheral vascular disease) ([place])   . Tobacco use disorder    intermittent cigar use now   Family History  Problem Relation Age of Onset  . Diabetes Mellitus II Mother    Past Surgical History:  Procedure Laterality Date  . ABDOMINAL AORTOGRAM W/LOWER EXTREMITY Bilateral 01/18/2019   Procedure: ABDOMINAL AORTOGRAM W/LOWER EXTREMITY;  Surgeon: Serafina Mitchell, MD;  Location: [facility];  Service: Cardiovascular;  Laterality: Bilateral;  . 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 AMPUTATION TOE Right 02/11/2019   Procedure: AMPUTATION OF TRANSMETATARSAL OF RIGHT FOOT.;  Surgeon: Felipa Furnace, DPM;  Location: [facility];  Service: Podiatry;  Laterality: Right;  . CARDIAC CATHETERIZATION N/A 04/23/2016   Procedure: Right/Left  Heart Cath and Coronary Angiography;  Surgeon: Belva Crome, MD;  Location: [facility];  Service: Cardiovascular;  Laterality: N/A;  . ICD IMPLANT N/A 01/27/2018   Procedure: ICD IMPLANT;  Surgeon: Deboraha Sprang, MD;  Location: [facility];  Service: Cardiovascular;  Laterality: N/A;  . INCISION AND DRAINAGE PERIRECTAL ABSCESS Right 11/17/2014   Procedure: debridement of right perianal and groin wound;  Surgeon: Johnathan Hausen, MD;  Location: [facility];  Service: General;  Laterality: Right;  . PERIPHERAL VASCULAR BALLOON ANGIOPLASTY Right 01/18/2019   Procedure: PERIPHERAL VASCULAR BALLOON ANGIOPLASTY;  Surgeon: Serafina Mitchell, MD;  Location: [facility];  Service: Cardiovascular;  Laterality: Right;  posterior tibial, peroneal    Short Social History:  Social History   Tobacco Use  . Smoking status: Current Some Day Smoker    Packs/day: 1.00    Years: 24.00    Pack years: 24.00    Types: Cigars    Last attempt to quit: 03/27/2016    Years since quitting: 4.3  . Smokeless tobacco: Never Used  . Tobacco comment: h/o cigarettes with ongoing occasional cigar use  Substance Use Topics  . Alcohol use: Yes    Comment: occ    Allergies  Allergen Reactions  . Nickel Rash    Current Outpatient Medications  Medication Sig Dispense Refill  . albuterol (PROVENTIL HFA;VENTOLIN HFA) 108 (90 Base) MCG/ACT inhaler Inhale 2 puffs into the lungs every 6 (six) hours as needed for wheezing or shortness of breath.    . Alcohol Swabs  PADS 1 Package by Does not apply route as needed (with blood glucose checks). 1 each 0  . aspirin EC 81 MG tablet Take 81 mg by mouth daily.    Marland Kitchen atorvastatin (LIPITOR) 80 MG tablet TAKE 1 TABLET EVERY DAY 90 tablet 3  . carvedilol (COREG) 6.25 MG tablet Take 1.5 tablets (9.375 mg total) by mouth 2 (two) times daily with a meal. 270 tablet 3  . FARXIGA 10 MG TABS tablet TAKE 1 TABLET BY MOUTH EVERY DAY BEFORE BREAKFAST D/C JARDIANCE 30 tablet 6  .  furosemide (LASIX) 20 MG tablet TAKE 1 TABLET BY MOUTH DAILY AS NEEDED FOR FLUID OR EDEMA. 30 tablet 1  . glucose blood (CHOICE DM FORA G20 TEST STRIPS) test strip Use as instructed 100 each 11  . glucose monitoring kit (FREESTYLE) monitoring kit 1 each by Does not apply route as needed for other. 1 each 0  . hydrALAZINE (APRESOLINE) 50 MG tablet Take 3 tablets (150 mg total) by mouth 3 (three) times daily. NEED APPOINTMENT FOR FUTURE REFILLS 270 tablet 2  . Lancets (FREESTYLE) lancets Use as instructed 100 each 11  . metFORMIN (GLUCOPHAGE) 1000 MG tablet Take 1,000 mg by mouth 2 (two) times daily with a meal.    . povidone-Iodine (BETADINE) 5 % SOLN topical solution Apply 1 application topically 3 (three) times daily as needed for wound care (foot wound).    . sacubitril-valsartan (ENTRESTO) 97-103 MG Take 1 tablet by mouth 2 (two) times daily. Need appt for future refills 180 tablet 0  . sodium chloride irrigation 0.9 % irrigation Irrigate with 1 application as directed 4 (four) times daily as needed (wound care).    Marland Kitchen spironolactone (ALDACTONE) 25 MG tablet TAKE 1 TABLET EVERY DAY 90 tablet 3  . TRULICITY 5.79 UX/8.3FX SOPN INJECT 0.5 MLS (0.75MG) SUBCUTANEOUSLY ONCE A WEEK    . clopidogrel (PLAVIX) 75 MG tablet TAKE 1 TABLET EVERY DAY (Patient not taking: Reported on 08/10/2020) 90 tablet 1  . isosorbide mononitrate (IMDUR) 30 MG 24 hr tablet Take 1 tablet (30 mg total) by mouth daily. 90 tablet 3   No current facility-administered medications for this visit.    Review of Systems  Constitutional:  Constitutional negative. HENT: HENT negative.  Eyes: Eyes negative.  Respiratory: Respiratory negative.  Cardiovascular: Positive for leg swelling.  GI: Gastrointestinal negative.  Musculoskeletal: Musculoskeletal negative.  Skin: Skin negative.  Neurological: Neurological negative. Hematologic: Hematologic/lymphatic negative.  Psychiatric: Psychiatric negative.        Objective:   Objective   Vitals:   08/10/20 0845  BP: (!) 148/90  Pulse: (!) 59  Resp: 20  Temp: 97.7 F (36.5 C)  SpO2: 95%  Weight: 211 lb (95.7 kg)  Height: 5' 8"  (1.727 m)   Body mass index is 32.08 kg/m.  Physical Exam HENT:     Head: Normocephalic.     Nose:     Comments: Wearing a mask Eyes:     Pupils: Pupils are equal, round, and reactive to light.  Neck:     Vascular: No carotid bruit.  Cardiovascular:     Pulses: Normal pulses.  Pulmonary:     Effort: Pulmonary effort is normal.  Abdominal:     General: Abdomen is flat.     Palpations: Abdomen is soft. There is no mass.  Musculoskeletal:        General: Normal range of motion.     Comments: Well-healed right transmetatarsal amputation  Skin:    General: Skin is  warm and dry.     Capillary Refill: Capillary refill takes less than 2 seconds.  Neurological:     General: No focal deficit present.     Mental Status: He is alert.  Psychiatric:        Mood and Affect: Mood normal.        Thought Content: Thought content normal.        Judgment: Judgment normal.     Data: ABI Findings:  +---------+------------------+-----+---------+----------------+  Right  Rt Pressure (mmHg)IndexWaveform Comment       +---------+------------------+-----+---------+----------------+  Brachial 169                          +---------+------------------+-----+---------+----------------+  PTA   160        0.95 biphasic           +---------+------------------+-----+---------+----------------+  DP    166        0.98 triphasic          +---------+------------------+-----+---------+----------------+  Great Toe                 Amputated digits  +---------+------------------+-----+---------+----------------+   +---------+------------------+-----+---------+-------+  Left   Lt Pressure (mmHg)IndexWaveform Comment   +---------+------------------+-----+---------+-------+  Brachial 160                      +---------+------------------+-----+---------+-------+  PTA   159        0.94 triphasic      +---------+------------------+-----+---------+-------+  DP    164        0.97 triphasic      +---------+------------------+-----+---------+-------+  Great Toe111        0.66            +---------+------------------+-----+---------+-------+   +-------+-----------+-----------+------------+------------+  ABI/TBIToday's ABIToday's TBIPrevious ABIPrevious TBI  +-------+-----------+-----------+------------+------------+  Right 0.98    Amputated 0.74    0.31      +-------+-----------+-----------+------------+------------+  Left  0.97    0.66    1.26    0.64      +-------+-----------+-----------+------------+------------+    +-----------+--------+-----+--------+---------+--------+  RIGHT   PSV cm/sRatioStenosisWaveform Comments  +-----------+--------+-----+--------+---------+--------+  CFA Prox  74          triphasic      +-----------+--------+-----+--------+---------+--------+  CFA Distal 88          triphasic      +-----------+--------+-----+--------+---------+--------+  DFA    68          triphasic      +-----------+--------+-----+--------+---------+--------+  SFA Prox  123          triphasic      +-----------+--------+-----+--------+---------+--------+  SFA Mid  90          triphasic      +-----------+--------+-----+--------+---------+--------+  SFA Distal 114          triphasic      +-----------+--------+-----+--------+---------+--------+  POP Prox  87          biphasic        +-----------+--------+-----+--------+---------+--------+  POP Distal 94          triphasic      +-----------+--------+-----+--------+---------+--------+  ATA Distal 72          triphasic      +-----------+--------+-----+--------+---------+--------+  PTA Distal 31          biphasic       +-----------+--------+-----+--------+---------+--------+  PERO Distal60          biphasic       +-----------+--------+-----+--------+---------+--------+    Summary:  Right: The  right lower extremity arteries appear patent without evidence  of significant stenosis.      Assessment/Plan:     50 year old male previous right lower extremity endovascular invention and healed right transmetatarsal amputation.  He is now on aspirin and a statin but continues smoke half pack per day.  We discussed smoking cessation as his greatest contribution to his overall health.  We will follow him up in 1 year with repeat right lower extremity duplex and ABIs     Waynetta Sandy MD [facility]

## 2020-08-30 ENCOUNTER — Ambulatory Visit: Payer: 59

## 2020-09-03 LAB — CUP PACEART REMOTE DEVICE CHECK
Battery Remaining Longevity: 156 mo
Battery Remaining Percentage: 100 %
Brady Statistic RV Percent Paced: 0 %
Date Time Interrogation Session: 20220428045700
HighPow Impedance: 59 Ohm
Implantable Lead Implant Date: 20190925
Implantable Lead Location: 753860
Implantable Lead Model: 293
Implantable Lead Serial Number: 441633
Implantable Pulse Generator Implant Date: 20190925
Lead Channel Impedance Value: 461 Ohm
Lead Channel Setting Pacing Amplitude: 2.5 V
Lead Channel Setting Pacing Pulse Width: 0.4 ms
Lead Channel Setting Sensing Sensitivity: 0.5 mV
Pulse Gen Serial Number: 244469

## 2020-09-04 ENCOUNTER — Other Ambulatory Visit (HOSPITAL_COMMUNITY): Payer: Self-pay | Admitting: Internal Medicine

## 2020-09-08 ENCOUNTER — Other Ambulatory Visit (HOSPITAL_COMMUNITY): Payer: Self-pay | Admitting: Internal Medicine

## 2020-09-10 ENCOUNTER — Other Ambulatory Visit (HOSPITAL_COMMUNITY): Payer: Self-pay | Admitting: Internal Medicine

## 2020-09-11 LAB — COLOGUARD: COLOGUARD: NEGATIVE

## 2020-10-01 ENCOUNTER — Other Ambulatory Visit (HOSPITAL_COMMUNITY): Payer: Self-pay | Admitting: Internal Medicine

## 2020-10-01 ENCOUNTER — Other Ambulatory Visit (HOSPITAL_COMMUNITY): Payer: Self-pay | Admitting: Cardiology

## 2020-10-03 ENCOUNTER — Other Ambulatory Visit (HOSPITAL_COMMUNITY): Payer: Self-pay | Admitting: Internal Medicine

## 2020-10-05 ENCOUNTER — Other Ambulatory Visit (HOSPITAL_COMMUNITY): Payer: Self-pay | Admitting: *Deleted

## 2020-10-05 MED ORDER — CARVEDILOL 6.25 MG PO TABS: 6.2500 mg | ORAL_TABLET | Freq: Two times a day (BID) | ORAL | 0 refills | Status: DC

## 2020-10-14 ENCOUNTER — Other Ambulatory Visit (HOSPITAL_COMMUNITY): Payer: Self-pay | Admitting: Internal Medicine

## 2020-11-10 IMAGING — DX DG FOOT 2V*R*
2 series · 2 of 2 positions shown · non-contrast
Comparison: Right foot radiograph 01/28/2019

CLINICAL DATA: Transmetatarsal amputation of right foot

EXAM:
RIGHT FOOT - 2 VIEW

[foot ap]
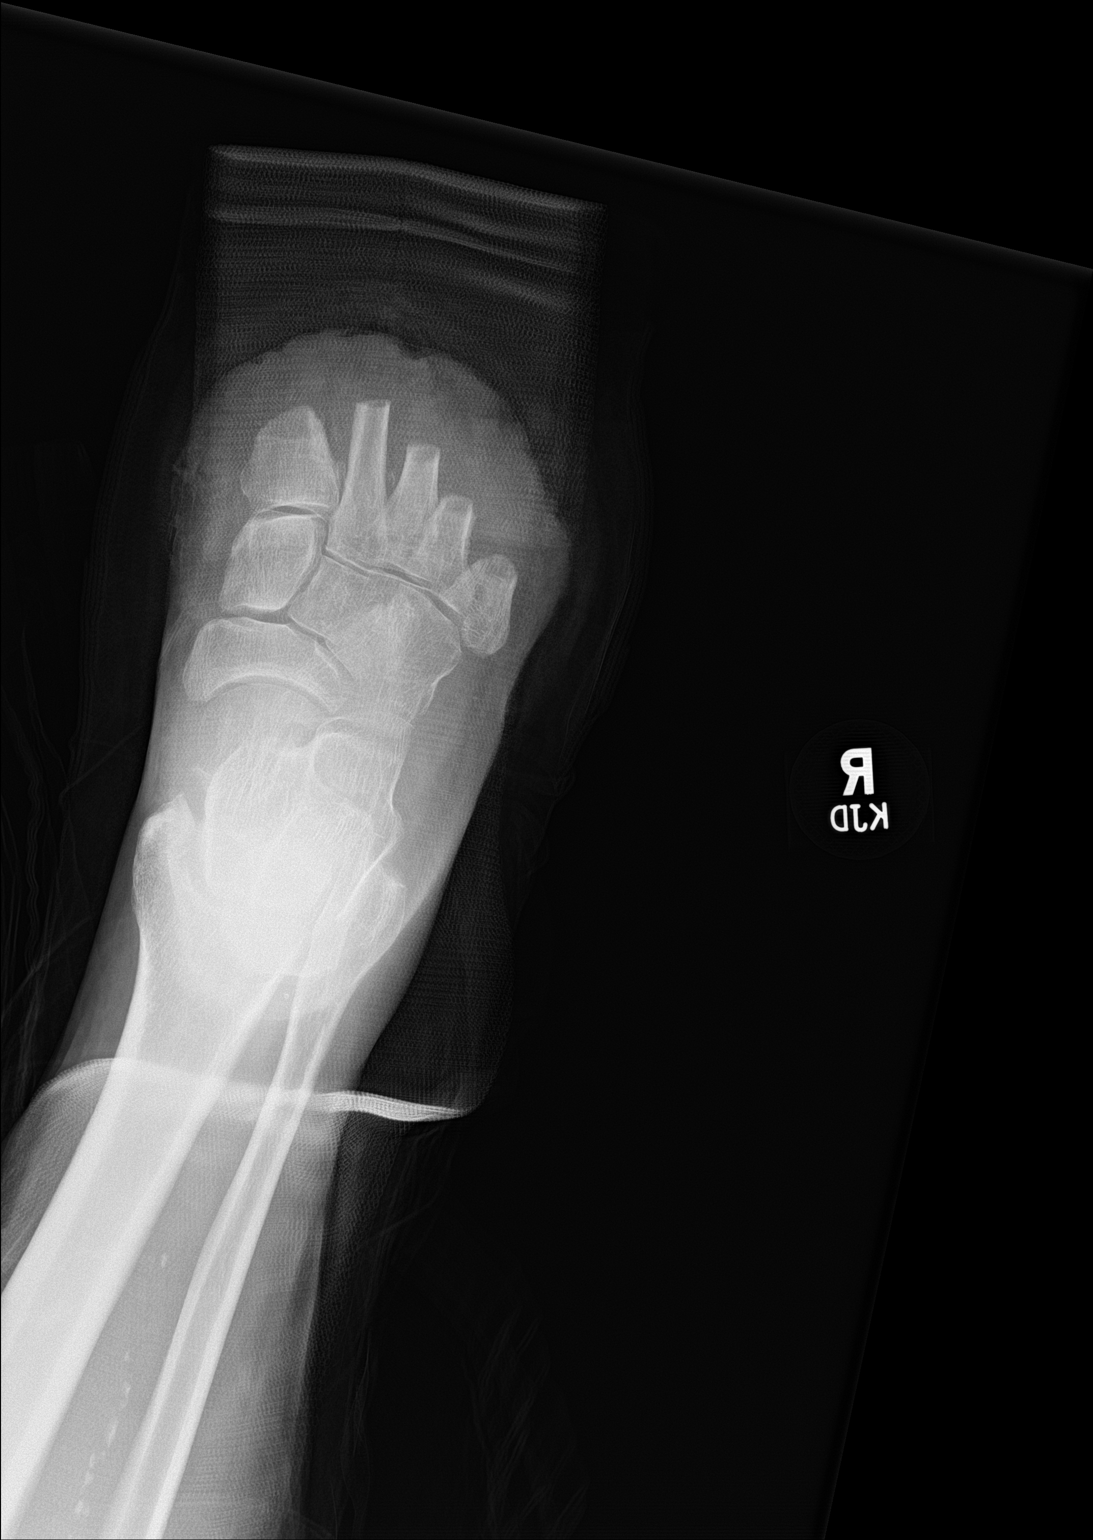

[foot lat]
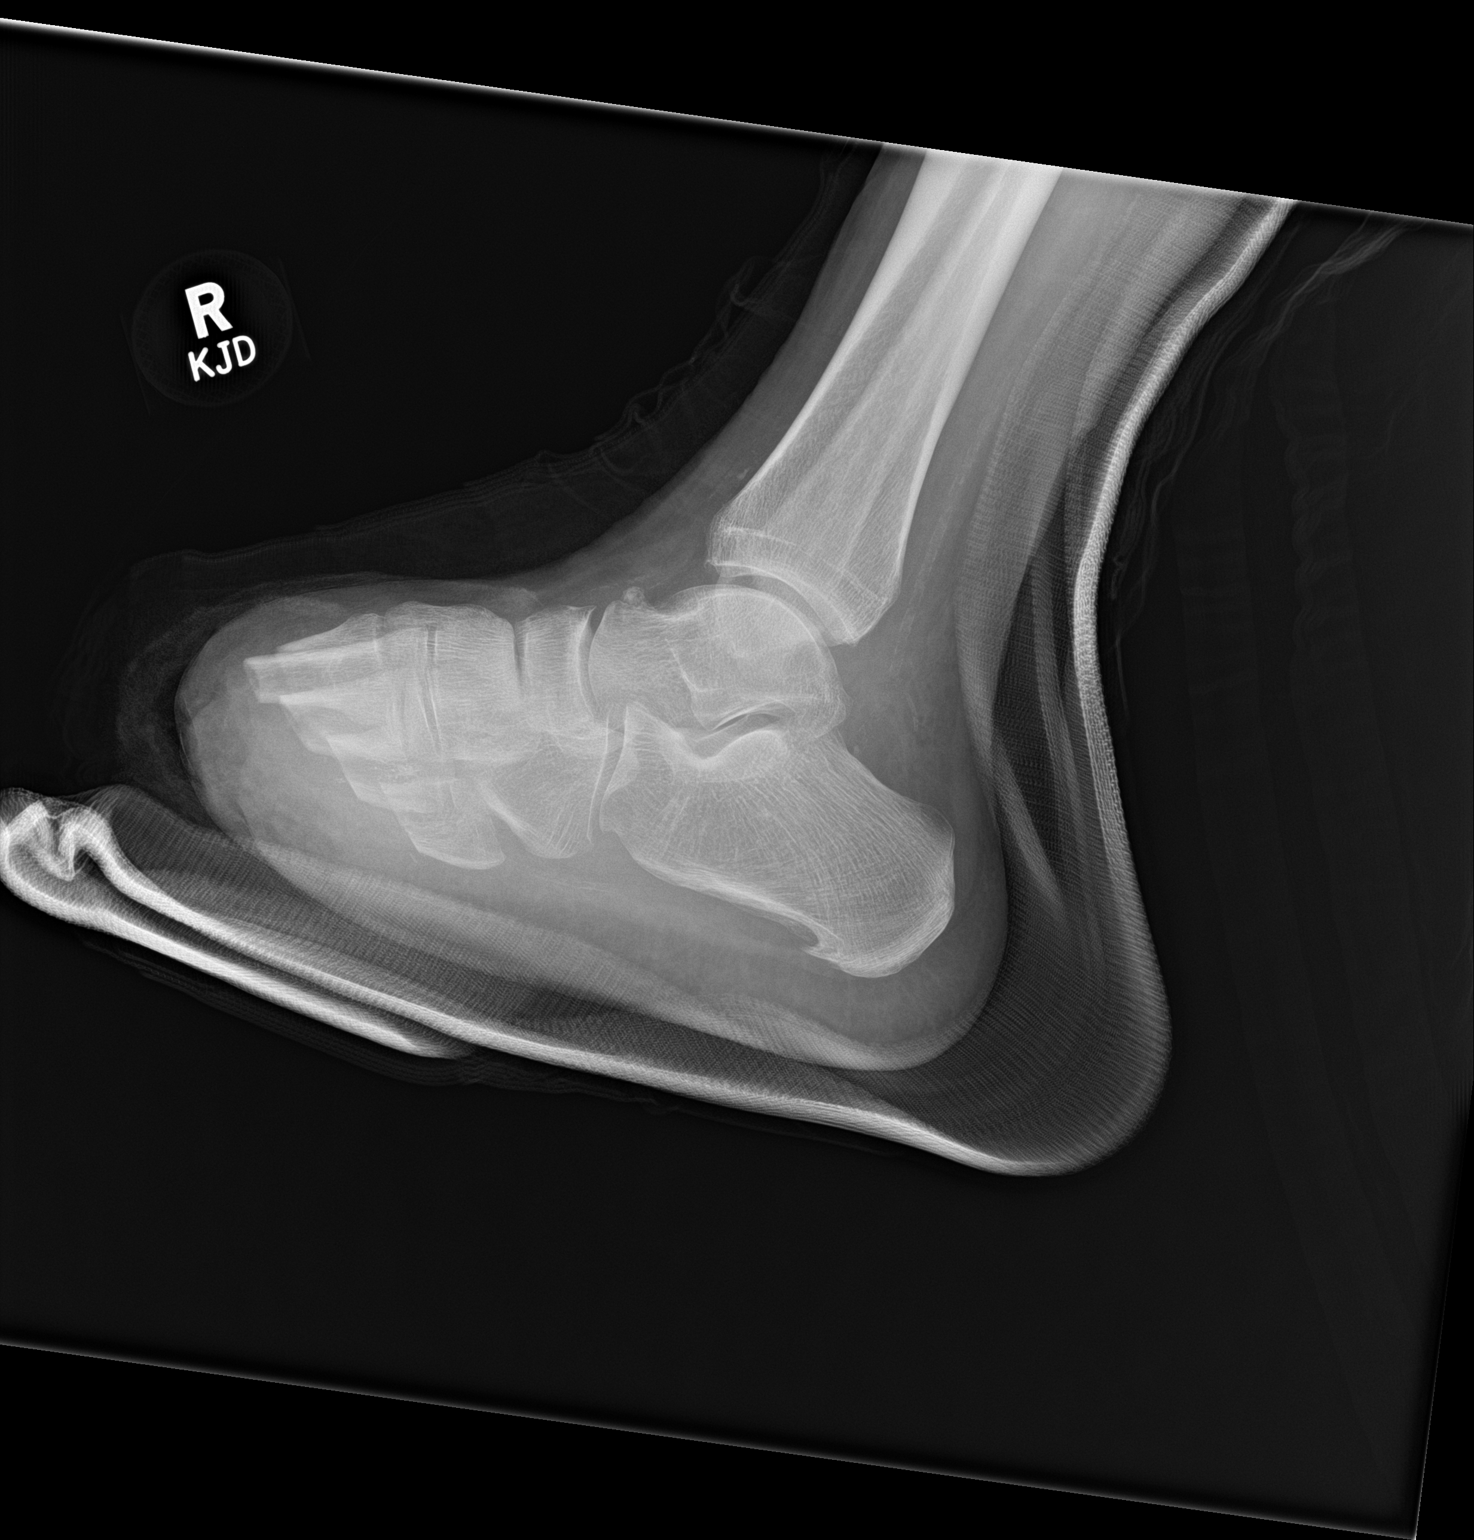

[2 of 2 positions shown; findings below may reference images not displayed]

FINDINGS: Status post right foot transmetatarsal amputation. There is
bandaging material at the amputation site. Expected postoperative
appearance of the bones. Mild vascular calcification.
IMPRESSION: Expected postoperative appearance status post transmetatarsal
amputation.

## 2020-11-15 ENCOUNTER — Other Ambulatory Visit (HOSPITAL_COMMUNITY): Payer: Self-pay | Admitting: Internal Medicine

## 2020-11-23 ENCOUNTER — Other Ambulatory Visit (HOSPITAL_COMMUNITY): Payer: Self-pay | Admitting: Internal Medicine

## 2020-11-29 ENCOUNTER — Ambulatory Visit (INDEPENDENT_AMBULATORY_CARE_PROVIDER_SITE_OTHER): Payer: 59

## 2020-11-29 DIAGNOSIS — I255 Ischemic cardiomyopathy: Secondary | ICD-10-CM

## 2020-11-30 LAB — CUP PACEART REMOTE DEVICE CHECK
Battery Remaining Longevity: 150 mo
Battery Remaining Percentage: 100 %
Brady Statistic RV Percent Paced: 0 %
Date Time Interrogation Session: 20220727153400
HighPow Impedance: 60 Ohm
Implantable Lead Implant Date: 20190925
Implantable Lead Location: 753860
Implantable Lead Model: 293
Implantable Lead Serial Number: 441633
Implantable Pulse Generator Implant Date: 20190925
Lead Channel Impedance Value: 465 Ohm
Lead Channel Setting Pacing Amplitude: 2.5 V
Lead Channel Setting Pacing Pulse Width: 0.4 ms
Lead Channel Setting Sensing Sensitivity: 0.5 mV
Pulse Gen Serial Number: 244469

## 2020-12-19 ENCOUNTER — Encounter (HOSPITAL_COMMUNITY): Payer: 59 | Admitting: Internal Medicine

## 2020-12-25 NOTE — Progress Notes (Signed)
Remote ICD transmission.   

## 2020-12-27 ENCOUNTER — Other Ambulatory Visit: Payer: Self-pay

## 2020-12-27 ENCOUNTER — Ambulatory Visit (HOSPITAL_COMMUNITY)
Admission: RE | Admit: 2020-12-27 | Discharge: 2020-12-27 | Disposition: A | Discharge status: Home / Self Care | Payer: 59 | Source: Ambulatory Visit | Attending: Internal Medicine | Admitting: Internal Medicine

## 2020-12-27 ENCOUNTER — Encounter (HOSPITAL_COMMUNITY): Payer: Self-pay | Admitting: Internal Medicine

## 2020-12-27 VITALS — BP 114/77 | HR 71 | Wt 208.2 lb

## 2020-12-27 DIAGNOSIS — I5042 Chronic combined systolic (congestive) and diastolic (congestive) heart failure: Secondary | ICD-10-CM

## 2020-12-27 DIAGNOSIS — I10 Essential (primary) hypertension: Secondary | ICD-10-CM

## 2020-12-27 DIAGNOSIS — Z7902 Long term (current) use of antithrombotics/antiplatelets: Secondary | ICD-10-CM | POA: Insufficient documentation

## 2020-12-27 DIAGNOSIS — Z79899 Other long term (current) drug therapy: Secondary | ICD-10-CM | POA: Insufficient documentation

## 2020-12-27 DIAGNOSIS — M869 Osteomyelitis, unspecified: Secondary | ICD-10-CM | POA: Insufficient documentation

## 2020-12-27 DIAGNOSIS — G4733 Obstructive sleep apnea (adult) (pediatric): Secondary | ICD-10-CM | POA: Diagnosis not present

## 2020-12-27 DIAGNOSIS — I2510 Atherosclerotic heart disease of native coronary artery without angina pectoris: Secondary | ICD-10-CM | POA: Diagnosis not present

## 2020-12-27 DIAGNOSIS — F17290 Nicotine dependence, other tobacco product, uncomplicated: Secondary | ICD-10-CM | POA: Insufficient documentation

## 2020-12-27 DIAGNOSIS — E1151 Type 2 diabetes mellitus with diabetic peripheral angiopathy without gangrene: Secondary | ICD-10-CM | POA: Diagnosis not present

## 2020-12-27 DIAGNOSIS — Z7984 Long term (current) use of oral hypoglycemic drugs: Secondary | ICD-10-CM | POA: Insufficient documentation

## 2020-12-27 DIAGNOSIS — I25118 Atherosclerotic heart disease of native coronary artery with other forms of angina pectoris: Secondary | ICD-10-CM | POA: Diagnosis not present

## 2020-12-27 DIAGNOSIS — Z720 Tobacco use: Secondary | ICD-10-CM

## 2020-12-27 DIAGNOSIS — Z95810 Presence of automatic (implantable) cardiac defibrillator: Secondary | ICD-10-CM | POA: Insufficient documentation

## 2020-12-27 DIAGNOSIS — I110 Hypertensive heart disease with heart failure: Secondary | ICD-10-CM | POA: Insufficient documentation

## 2020-12-27 DIAGNOSIS — Z7982 Long term (current) use of aspirin: Secondary | ICD-10-CM | POA: Diagnosis not present

## 2020-12-27 DIAGNOSIS — I739 Peripheral vascular disease, unspecified: Secondary | ICD-10-CM

## 2020-12-27 LAB — BRAIN NATRIURETIC PEPTIDE: B Natriuretic Peptide: 36.3 pg/mL (ref 0.0–100.0)

## 2020-12-27 LAB — COMPREHENSIVE METABOLIC PANEL
ALT: 23 U/L (ref 0–44)
AST: 17 U/L (ref 15–41)
Albumin: 4.1 g/dL (ref 3.5–5.0)
Alkaline Phosphatase: 59 U/L (ref 38–126)
Anion gap: 8 (ref 5–15)
BUN: 18 mg/dL (ref 6–20)
CO2: 26 mmol/L (ref 22–32)
Calcium: 9.5 mg/dL (ref 8.9–10.3)
Chloride: 103 mmol/L (ref 98–111)
Creatinine, Ser: 1.39 mg/dL — ABNORMAL HIGH (ref 0.61–1.24)
GFR, Estimated: >60 mL/min (ref >60)
Glucose, Bld: 121 mg/dL — ABNORMAL HIGH (ref 70–99)
Potassium: 4.4 mmol/L (ref 3.5–5.1)
Sodium: 137 mmol/L (ref 135–145)
Total Bilirubin: 0.7 mg/dL (ref 0.3–1.2)
Total Protein: 6.7 g/dL (ref 6.5–8.1)

## 2020-12-27 NOTE — Patient Instructions (Addendum)
Increase carvedilol to 9.375 mg twice a day (1.5 tablets twice daily)  Labs done today, we will call you for abnormal results  Your physician has requested that you have an echocardiogram. Echocardiography is a painless test that uses sound waves to create images of your heart. It provides your doctor with information about the size and shape of your heart and how well your heart's chambers and valves are working. This procedure takes approximately one hour. There are no restrictions for this procedure.  Please call our office in January to schedule your follow up appointment  If you have any questions or concerns before your next appointment please send [place] a message through [place] or call our office at [phone].    TO LEAVE A MESSAGE FOR THE NURSE SELECT OPTION 2, PLEASE LEAVE A MESSAGE INCLUDING: YOUR NAME DATE OF BIRTH CALL BACK NUMBER REASON FOR CALL**this is important as we prioritize the call backs  YOU WILL RECEIVE A CALL BACK THE SAME DAY AS LONG AS YOU CALL BEFORE 4:00 PM  At the [facility], you and your health needs are our priority. As part of our continuing mission to provide you with exceptional heart care, we have created designated Provider Care Teams. These Care Teams include your primary Cardiologist (physician) and Advanced Practice Providers (APPs- Physician Assistants and Nurse Practitioners) who all work together to provide you with the care you need, when you need it.   You may see any of the following providers on your designated Care Team at your next follow up: Dr Glori Bickers Dr Loralie Champagne Dr Patrice Paradise, NP Lyda Jester, [place] Ginnie Smart Audry Riles, PharmD   Please be sure to bring in all your medications bottles to every appointment.

## 2020-12-27 NOTE — Progress Notes (Addendum)
Advanced Heart Failure Clinic Note   Referring Physician: Crenshaw Primary Care: Dr. Ouida Sills, MD at [facility] family Primary Cardiologist: Dr Stanford Breed HF: Dr. [name]  HPI: Christopher Roth is a 50 y.o. male with HTN, DM2, Tobacco abuse, CAD, PAD and systolic CHF. S/P [employer] ICD 01/2018.   Admitted 12/17 with new onest HF. Echo EF 25-30%, Grade 2 DD. Diuresed 12 lbs. LHC recent occlusion of LAD.   cMRI 07/16/16 -> recalculated 08/19/16: EF 41% scar in LAD distribution. No viability  ECHO 2/21 EF 30-35%  He is s/p RLE endovascular intervention and R TMA for osteomyelitis October 2020. Followed by Dr. Donzetta Matters.  He is here for f/u.  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 No significant dyspnea with ordinary walking but does note he gets winded mowing his lawn with push mower. No dyspnea at rest, orthopnea/PND or leg edema. Has occasional exertional chest pain that resolves with rest. Symptoms infrequent, last episode 1 month ago. Has new callus on left foot that appeared about a month ago.  Taking all medicines as prescribed.  Has been smoking 3 cigars a day and consumes 3 alcoholic beverages weekly.   Cardiac studies:  LHC 04/23/16 - Recent total occlusion of the proximal to mid LAD.  - 70% ramus intermedius, 65% ostial circumflex followed by 70% mid circumflex, 75% proximal LAD prior to the origin of a small to moderate size diagonal, and 90% mid PDA. - Mild PAH, PCWP 14 - LVEF 10-15% Hemodynamics RHC Procedural Findings: Hemodynamics (mmHg) RA mean 4 RV 39/1 PA 37/12 PCWP 14 AO 115/78 Cardiac Output (Fick) 5.63 Cardiac Index (Fick) 2.71  Past Medical History:  Diagnosis Date   Acute systolic congestive heart failure ([place]) 04/22/2016   AICD (automatic cardioverter/defibrillator) present 01/27/2018   [employer]   Diabetes mellitus with peripheral vascular disease ([place])    Type II   Diabetic foot ulcer ([place])  01/31/2019   GERD (gastroesophageal reflux disease)    Hypertension    Osteomyelitis of ankle and foot ([place]) 02/22/2019   PVD (peripheral vascular disease) ([place])    Tobacco use disorder    intermittent cigar use now    Current Outpatient Medications  Medication Sig Dispense Refill   albuterol (PROVENTIL HFA;VENTOLIN HFA) 108 (90 Base) MCG/ACT inhaler Inhale 2 puffs into the lungs every 6 (six) hours as needed for wheezing or shortness of breath.     Alcohol Swabs PADS 1 Package by Does not apply route as needed (with blood glucose checks). 1 each 0   aspirin EC 81 MG tablet Take 81 mg by mouth daily.     atorvastatin (LIPITOR) 80 MG tablet TAKE 1 TABLET EVERY DAY 90 tablet 3   carvedilol (COREG) 6.25 MG tablet Take 1 tablet (6.25 mg total) by mouth 2 (two) times daily with a meal. Needs appt for further refills 180 tablet 0   clopidogrel (PLAVIX) 75 MG tablet TAKE 1 TABLET EVERY DAY (Patient not taking: Reported on 08/10/2020) 90 tablet 1   ENTRESTO 97-103 MG TAKE 1 TABLET BY MOUTH IN THE MORNING AND AT BEDTIME. LAST REFILL WITHOUT OFFICE VISIT 60 tablet 0   FARXIGA 10 MG TABS tablet TAKE 1 TABLET BY MOUTH EVERY DAY BEFORE BREAKFAST D/C JARDIANCE 30 tablet 6   furosemide (LASIX) 20 MG tablet TAKE 1 TABLET BY MOUTH DAILY AS NEEDED FOR FLUID OR EDEMA. 30 tablet 3   glucose blood (CHOICE DM FORA G20 TEST  STRIPS) test strip Use as instructed 100 each 11   glucose monitoring kit (FREESTYLE) monitoring kit 1 each by Does not apply route as needed for other. 1 each 0   hydrALAZINE (APRESOLINE) 50 MG tablet Take 3 tablets (150 mg total) by mouth 3 (three) times daily. NEED APPOINTMENT FOR FUTURE REFILLS 270 tablet 2   isosorbide mononitrate (IMDUR) 30 MG 24 hr tablet TAKE 1 TABLET BY MOUTH EVERY DAY 30 tablet 0   Lancets (FREESTYLE) lancets Use as instructed 100 each 11   metFORMIN (GLUCOPHAGE) 1000 MG tablet Take 1,000 mg by mouth 2 (two) times daily with a meal.     povidone-Iodine (BETADINE) 5 %  SOLN topical solution Apply 1 application topically 3 (three) times daily as needed for wound care (foot wound).     sodium chloride irrigation 0.9 % irrigation Irrigate with 1 application as directed 4 (four) times daily as needed (wound care).     spironolactone (ALDACTONE) 25 MG tablet Take 1 tablet (25 mg total) by mouth daily. Needs appt for further refills 90 tablet 0   TRULICITY 1.65 VV/7.4MO SOPN INJECT 0.5 MLS (0.75MG) SUBCUTANEOUSLY ONCE A WEEK     No current facility-administered medications for this visit.    Allergies  Allergen Reactions   Nickel Rash     Social History   Socioeconomic History   Marital status: Married    Spouse name: Not on file   Number of children: Not on file   Years of education: Not on file   Highest education level: Not on file  Occupational History   Not on file  Tobacco Use   Smoking status: Some Days    Packs/day: 1.00    Years: 24.00    Pack years: 24.00    Types: Cigars, Cigarettes    Last attempt to quit: 03/27/2016    Years since quitting: 4.7   Smokeless tobacco: Never   Tobacco comments:    h/o cigarettes with ongoing occasional cigar use  Vaping Use   Vaping Use: Never used  Substance and Sexual Activity   Alcohol use: Yes    Comment: occ   Drug use: No   Sexual activity: Not on file  Other Topics Concern   Not on file  Social History Narrative   Not on file   Social Determinants of Health   Financial Resource Strain: Not on file  Food Insecurity: Not on file  Transportation Needs: Not on file  Physical Activity: Not on file  Stress: Not on file  Social Connections: Not on file  Intimate Partner Violence: Not on file   Family History  Problem Relation Age of Onset   Diabetes Mellitus II Mother     There were no vitals filed for this visit. Wt Readings from Last 3 Encounters:  08/10/20 95.7 kg (211 lb)  10/31/19 101.6 kg (224 lb)  08/30/19 98 kg (216 lb)   PHYSICAL EXAM: General:  Well appearing. No resp  difficulty HEENT: normal Neck: supple. no JVD. Carotids 2+ bilat; no bruits. No lymphadenopathy or thryomegaly appreciated. Cor: PMI nondisplaced. Regular rate & rhythm. No rubs, gallops or murmurs. Lungs: CTA Abdomen: soft, nontender, nondistended. No hepatosplenomegaly. No bruits or masses. Good bowel sounds. Extremities: no cyanosis, clubbing, rash, edema. Callus noted on plantar aspect of left great metatarsal. There does not appear to be an open wound. Neuro: alert & orientedx3, cranial nerves grossly intact. moves all 4 extremities w/o difficulty. Affect pleasant  ICD interrogation: No VT, activity level 2.1  hrs, Heart logic index 1  ECG: NSR 75 bpm, RBBB, old anteroseptal infarct  ASSESSMENT & PLAN:  1. Chronic combined systolic and diastolic CHF -  ICM with Echo 04/2016 LVEF 25-30%, Grade 2 DD (EF 10-15% by cath) - Cath with chronic occlusion of LAD with no viability on MRI. Moderate non-obstructive CAD elsewhere - cMRI 07/16/16 -> recalculated 08/2016 EF 41%. Anterior scar with no significant viability - S/P [employer] ICD 01/2018  - Echo 2/21 EF 30-35% - NYHA II. Appears euvolemic. Not requiring loop diuretic. - On carvedilol 6.25 mg BID. Took 9.375 mg BID previously and tolerated well, will increase back to that dose - Continue Entresto 97/132m BID.  - Continue hydralazine 150 mg three times a day + imdur 30 mg daily (was unable to afford Bidil)  - Continue spiro 25 mg daily.  - Continue Farxiga 10 daily - CMET and BNP today - Repeat echo - Minimize ETOH  2. CAD via [place] - Chronic occlusion of LAD with no viability on MRI. Moderate non-obstructive CAD elsewhere - Describes occasional exertional CP that sounds consistent with stable angina. Symptoms infrequent. Will need to consider repeat LHC if symptoms increase in frequency or duration. Discussed when to call clinic. - Continue ASA and statin  3. HTN -  BP at goal. Increasing coreg to optimize GDMT as  above   4. DMII - Continue FWilder Glade - Last A1c 7.6 in July. - Managed by PCP  5. Tobacco abuse - Had quit. Now smoking 3 cigars daily. Recommend complete cessation.  6. PAD/R Foot Osteomyelitis - s/p PTA right anterior tibial and peroneal arteries followed by R foot TMA amputation 2020. - Has callus on plantar aspect of let foot. Has not seen his podiatrist since November '21, recommended he f/u.  7. OSA - mild by PSG (6/21) AHI 5.3. - On CPAP, not using consistently.  - Followed by Dr. TRadford Pax F/u: 6 months  FINCH, LLynder Parents PA-C  2:41 PM  Patient seen and examined with the above-signed Advanced Practice Provider and/or Housestaff. I personally reviewed laboratory data, imaging studies and relevant notes. I independently examined the patient and formulated the important aspects of the plan. I have edited the note to reflect any of my changes or salient points. I have personally discussed the plan with the patient and/or family.  50y/o male with DM2, HTN, systolic HF due to iCM EF 30-35%  Currently working at [employer] Working 16 hours/day. Mild exertional dyspnea. Gets CP/indigestion about 1x/month. Improves with Alka-Seltzer.   General:  Well appearing. No resp difficulty HEENT: normal Neck: supple. no JVD. Carotids 2+ bilat; no bruits. No lymphadenopathy or thryomegaly appreciated. Cor: PMI nondisplaced. Regular rate & rhythm. No rubs, gallops or murmurs. Lungs: clear Abdomen: obese soft, nontender, nondistended. No hepatosplenomegaly. No bruits or masses. Good bowel sounds. Extremities: no cyanosis, clubbing, rash, edema  Callous on left foot Neuro: alert & orientedx3, cranial nerves grossly intact. moves all 4 extremities w/o difficulty. Affect pleasant  Overall stable NYHA II. Volume status stable. Occasional chest discomfort but not clear angina.   Will increase carvedilol to 9.375 bid. Repeat echo. Check labs. If CP increasing will need ischemic eval. Asked  him to go back to Podiatry to have foot looked at.   ICD interrogated in clinic. No VT/AF. Volume ok.   DGlori Bickers MD  5:07 PM

## 2020-12-28 MED ORDER — CARVEDILOL 6.25 MG PO TABS: 9.3750 mg | ORAL_TABLET | Freq: Two times a day (BID) | ORAL | 3 refills | Status: DC

## 2020-12-28 NOTE — Addendum Note (Signed)
Encounter addended by: Scarlette Calico, RN on: 12/28/2020 3:02 PM  Actions taken: Order list changed, Pharmacy for encounter modified

## 2021-01-02 ENCOUNTER — Ambulatory Visit: Payer: 59 | Admitting: Podiatry

## 2021-01-02 ENCOUNTER — Other Ambulatory Visit: Payer: Self-pay

## 2021-01-02 DIAGNOSIS — L84 Corns and callosities: Secondary | ICD-10-CM

## 2021-01-02 DIAGNOSIS — Q666 Other congenital valgus deformities of feet: Secondary | ICD-10-CM | POA: Diagnosis not present

## 2021-01-02 DIAGNOSIS — E1165 Type 2 diabetes mellitus with hyperglycemia: Secondary | ICD-10-CM | POA: Diagnosis not present

## 2021-01-03 NOTE — Progress Notes (Signed)
Subjective:  Patient ID: Christopher Roth, male    DOB: 1970-08-07,  MRN: [id number]  Chief Complaint  Patient presents with   Foot Ulcer    Left foot ulcer submit hallux     50 y.o. male presents with the above complaint.  Patient presents with follow-up to left submetatarsal preulcerative callus.  He wanted to get evaluated make sure that has not had any ulceration.  He is a diabetic he has had previous amputation done by me.  He also would like to obtain new pair of orthotics as the previous ones are greater than 65-year-old.   Review of Systems: Negative except as noted in the HPI. Denies N/V/F/Ch.  Past Medical History:  Diagnosis Date   Acute systolic congestive heart failure ([place]) 04/22/2016   AICD (automatic cardioverter/defibrillator) present 01/27/2018   [employer]   Diabetes mellitus with peripheral vascular disease ([place])    Type II   Diabetic foot ulcer ([place]) 01/31/2019   GERD (gastroesophageal reflux disease)    Hypertension    Osteomyelitis of ankle and foot ([place]) 02/22/2019   PVD (peripheral vascular disease) ([place])    Tobacco use disorder    intermittent cigar use now    Current Outpatient Medications:    albuterol (PROVENTIL HFA;VENTOLIN HFA) 108 (90 Base) MCG/ACT inhaler, Inhale 2 puffs into the lungs every 6 (six) hours as needed for wheezing or shortness of breath., Disp: , Rfl:    Alcohol Swabs PADS, 1 Package by Does not apply route as needed (with blood glucose checks)., Disp: 1 each, Rfl: 0   aspirin EC 81 MG tablet, Take 81 mg by mouth daily., Disp: , Rfl:    atorvastatin (LIPITOR) 80 MG tablet, TAKE 1 TABLET EVERY DAY, Disp: 90 tablet, Rfl: 3   carvedilol (COREG) 6.25 MG tablet, Take 1.5 tablets (9.375 mg total) by mouth 2 (two) times daily with a meal. Needs appt for further refills, Disp: 270 tablet, Rfl: 3   ENTRESTO 97-103 MG, TAKE 1 TABLET BY MOUTH IN THE MORNING AND AT BEDTIME. LAST REFILL WITHOUT OFFICE VISIT, Disp: 60 tablet, Rfl: 0   FARXIGA 10  MG TABS tablet, TAKE 1 TABLET BY MOUTH EVERY DAY BEFORE BREAKFAST D/C JARDIANCE, Disp: 30 tablet, Rfl: 6   furosemide (LASIX) 20 MG tablet, TAKE 1 TABLET BY MOUTH DAILY AS NEEDED FOR FLUID OR EDEMA., Disp: 30 tablet, Rfl: 3   glucose blood (CHOICE DM FORA G20 TEST STRIPS) test strip, Use as instructed, Disp: 100 each, Rfl: 11   glucose monitoring kit (FREESTYLE) monitoring kit, 1 each by Does not apply route as needed for other., Disp: 1 each, Rfl: 0   hydrALAZINE (APRESOLINE) 50 MG tablet, Take 3 tablets (150 mg total) by mouth 3 (three) times daily. NEED APPOINTMENT FOR FUTURE REFILLS, Disp: 270 tablet, Rfl: 2   isosorbide mononitrate (IMDUR) 30 MG 24 hr tablet, TAKE 1 TABLET BY MOUTH EVERY DAY, Disp: 30 tablet, Rfl: 0   Lancets (FREESTYLE) lancets, Use as instructed, Disp: 100 each, Rfl: 11   metFORMIN (GLUCOPHAGE) 1000 MG tablet, Take 1,000 mg by mouth 2 (two) times daily with a meal., Disp: , Rfl:    spironolactone (ALDACTONE) 25 MG tablet, Take 1 tablet (25 mg total) by mouth daily. Needs appt for further refills, Disp: 90 tablet, Rfl: 0   TRULICITY 2.22 LN/9.8XQ SOPN, INJECT 0.5 MLS (0.75MG) SUBCUTANEOUSLY ONCE A WEEK, Disp: , Rfl:   Social History   Tobacco Use  Smoking Status Some Days   Packs/day: 1.00  Years: 24.00   Pack years: 24.00   Types: Cigars, Cigarettes   Last attempt to quit: 03/27/2016   Years since quitting: 4.7  Smokeless Tobacco Never  Tobacco Comments   h/o cigarettes with ongoing occasional cigar use    Allergies  Allergen Reactions   Nickel Rash   Objective:  There were no vitals filed for this visit. There is no height or weight on file to calculate BMI. Constitutional Well developed. Well nourished.  Vascular Dorsalis pedis pulses palpable bilaterally. Posterior tibial pulses palpable bilaterally. Capillary refill normal to all digits.  No cyanosis or clubbing noted. Pedal hair growth normal.  Neurologic Normal speech. Oriented to person, place,  and time. Epicritic sensation to light touch grossly present bilaterally.  Dermatologic  hyperkeratotic lesion noted submetatarsal 1 on the left side without any concern for underlying wound.  No clinical signs of infection noted.  No pinpoint bleeding noted.  Orthopedic: Normal joint ROM without pain or crepitus bilaterally. No visible deformities. No bony tenderness.   Radiographs: None Assessment:   1. Pes planovalgus   2. Uncontrolled type 2 diabetes mellitus with hyperglycemia ([place])   3. Pre-ulcerative calluses     Plan:  Patient was evaluated and treated and all questions answered.  Left submetatarsal 1 preulcerative lesion/callus with underlying pes planovalgus -I explained to the patient the etiology of callus formation various treatment options were extensively discussed.  I discussed with him the importance of glucose management and diet control to help lower the A1c which will give him the best optimal outcome to prevent ulceration.  Patient states understanding will work on that.  At this time using chisel blade and handle the lesion was debrided down to healthy striated tissue.  No underlying ulceration noted.  No pinpoint bleeding noted. -Patient was casted for orthotics given his flatfoot/pes planovalgus structure with offloading submetatarsal preulcerative callus site. -Orthotics were casted -I discussed with him the importance of orthotics and offloading.  If any future ulceration reoccurs come back and see me.  He states understanding  No follow-ups on file.

## 2021-01-08 ENCOUNTER — Other Ambulatory Visit (HOSPITAL_COMMUNITY): Payer: Self-pay | Admitting: Internal Medicine

## 2021-01-09 ENCOUNTER — Telehealth: Payer: Self-pay | Admitting: Cardiology

## 2021-01-09 DIAGNOSIS — G4733 Obstructive sleep apnea (adult) (pediatric): Secondary | ICD-10-CM

## 2021-01-09 NOTE — Telephone Encounter (Signed)
What problem are you experiencing? Pt would like a different type of mask and hose  Who is your medical equipment company? CPAP  Please advise Please route to the sleep study assistant.

## 2021-01-10 NOTE — Telephone Encounter (Signed)
Patient called again to ask about alternatives to his CPAP machine. The patient states it is uncomfortable for him and he can not sleep in it.  Please let the patient know if there is an alternative

## 2021-01-10 NOTE — Telephone Encounter (Signed)
Attempted to call patient. Unable to leave voicemail.

## 2021-01-15 NOTE — Telephone Encounter (Signed)
Spoke with the patient who states that he has been unable to get comfortable with wearing his CPAP machine. He states that she switched from the nasal pillow to a full face mask. He has three different sizes and has not been able to tolerate any of them. He states that it leaks. He feels like he is more tired when he uses his CPAP. Patient would like some alternate options.

## 2021-01-15 NOTE — Telephone Encounter (Signed)
Pt called back in returning [place] call.   Best number [phone]

## 2021-01-17 ENCOUNTER — Ambulatory Visit (HOSPITAL_COMMUNITY)
Admission: RE | Admit: 2021-01-17 | Discharge: 2021-01-17 | Disposition: A | Discharge status: Home / Self Care | Payer: 59 | Source: Ambulatory Visit | Attending: Internal Medicine | Admitting: Internal Medicine

## 2021-01-17 ENCOUNTER — Other Ambulatory Visit: Payer: Self-pay

## 2021-01-17 DIAGNOSIS — I5042 Chronic combined systolic (congestive) and diastolic (congestive) heart failure: Secondary | ICD-10-CM | POA: Insufficient documentation

## 2021-01-17 DIAGNOSIS — F17200 Nicotine dependence, unspecified, uncomplicated: Secondary | ICD-10-CM | POA: Diagnosis not present

## 2021-01-17 DIAGNOSIS — I110 Hypertensive heart disease with heart failure: Secondary | ICD-10-CM | POA: Diagnosis not present

## 2021-01-17 DIAGNOSIS — K219 Gastro-esophageal reflux disease without esophagitis: Secondary | ICD-10-CM | POA: Insufficient documentation

## 2021-01-17 DIAGNOSIS — I739 Peripheral vascular disease, unspecified: Secondary | ICD-10-CM | POA: Diagnosis not present

## 2021-01-17 DIAGNOSIS — E119 Type 2 diabetes mellitus without complications: Secondary | ICD-10-CM | POA: Insufficient documentation

## 2021-01-17 LAB — ECHOCARDIOGRAM COMPLETE
Area-P 1/2: 3.56 cm2
S' Lateral: 3.5 cm

## 2021-01-17 NOTE — Progress Notes (Signed)
  Echocardiogram 2D Echocardiogram has been performed.  Darlina Sicilian M 01/17/2021, 3:21 PM

## 2021-01-18 NOTE — Telephone Encounter (Signed)
Patient has been referred to Dr. Toy Cookey, he is aware.

## 2021-02-01 ENCOUNTER — Encounter: Payer: Self-pay | Admitting: Internal Medicine

## 2021-02-01 ENCOUNTER — Ambulatory Visit (INDEPENDENT_AMBULATORY_CARE_PROVIDER_SITE_OTHER): Payer: 59 | Admitting: Internal Medicine

## 2021-02-01 ENCOUNTER — Other Ambulatory Visit: Payer: Self-pay

## 2021-02-01 VITALS — BP 120/70 | HR 74 | Ht 68.0 in | Wt 211.8 lb

## 2021-02-01 DIAGNOSIS — I5022 Chronic systolic (congestive) heart failure: Secondary | ICD-10-CM | POA: Diagnosis not present

## 2021-02-01 DIAGNOSIS — I255 Ischemic cardiomyopathy: Secondary | ICD-10-CM

## 2021-02-01 DIAGNOSIS — Z95810 Presence of automatic (implantable) cardiac defibrillator: Secondary | ICD-10-CM | POA: Diagnosis not present

## 2021-02-01 NOTE — Progress Notes (Signed)
Patient Care Team: Gregor Hams, FNP as PCP - General (Family Medicine) Sueanne Margarita, MD as PCP - Sleep Medicine (Cardiology)   HPI  Christopher Roth is a 50 y.o. male seen in follow-up for an ICD implanted 8/19 for primary prevention in the setting of ischemic heart disease.  DATE TEST EF    12/17 LHC 10.15% LAD T; residual 3 V CAD  12/17.e  Echo   25-30 %    3/18 cMRI  41 %  full thickness scar Anterior wall  8/19 Echo  25-30%    2/21 Echo  30-35%    9/22 Echo  45%     The patient denies chest pain, shortness of breath, nocturnal dyspnea, orthopnea or peripheral edema.  There have been no palpitations, lightheadedness or syncope.        Past Medical History:  Diagnosis Date   Acute systolic congestive heart failure ([place]) 04/22/2016   AICD (automatic cardioverter/defibrillator) present 01/27/2018   [employer]   Diabetes mellitus with peripheral vascular disease ([place])    Type II   Diabetic foot ulcer ([place]) 01/31/2019   GERD (gastroesophageal reflux disease)    Hypertension    Osteomyelitis of ankle and foot ([place]) 02/22/2019   PVD (peripheral vascular disease) ([place])    Tobacco use disorder    intermittent cigar use now    Past Surgical History:  Procedure Laterality Date   ABDOMINAL AORTOGRAM W/LOWER EXTREMITY Bilateral 01/18/2019   Procedure: ABDOMINAL AORTOGRAM W/LOWER EXTREMITY;  Surgeon: Serafina Mitchell, MD;  Location: [facility];  Service: Cardiovascular;  Laterality: Bilateral;   AMPUTATION Right 02/11/2019    AMPUTATION OF TRANSMETATARSAL OF RIGHT FOOT. (Right Toe)   AMPUTATION TOE Right 02/11/2019   Procedure: AMPUTATION OF TRANSMETATARSAL OF RIGHT FOOT.;  Surgeon: Felipa Furnace, DPM;  Location: [facility];  Service: Podiatry;  Laterality: Right;   CARDIAC CATHETERIZATION N/A 04/23/2016   Procedure: Right/Left Heart Cath and Coronary Angiography;  Surgeon: Belva Crome, MD;  Location: [facility];  Service: Cardiovascular;   Laterality: N/A;   ICD IMPLANT N/A 01/27/2018   Procedure: ICD IMPLANT;  Surgeon: Deboraha Sprang, MD;  Location: [facility];  Service: Cardiovascular;  Laterality: N/A;   INCISION AND DRAINAGE PERIRECTAL ABSCESS Right 11/17/2014   Procedure: debridement of right perianal and groin wound;  Surgeon: Johnathan Hausen, MD;  Location: [facility];  Service: General;  Laterality: Right;   PERIPHERAL VASCULAR BALLOON ANGIOPLASTY Right 01/18/2019   Procedure: PERIPHERAL VASCULAR BALLOON ANGIOPLASTY;  Surgeon: Serafina Mitchell, MD;  Location: [facility];  Service: Cardiovascular;  Laterality: Right;  posterior tibial, peroneal    Current Meds  Medication Sig   albuterol (PROVENTIL HFA;VENTOLIN HFA) 108 (90 Base) MCG/ACT inhaler Inhale 2 puffs into the lungs every 6 (six) hours as needed for wheezing or shortness of breath.   Alcohol Swabs PADS 1 Package by Does not apply route as needed (with blood glucose checks).   aspirin EC 81 MG tablet Take 81 mg by mouth daily.   atorvastatin (LIPITOR) 80 MG tablet TAKE 1 TABLET EVERY DAY   carvedilol (COREG) 6.25 MG tablet Take 1.5 tablets (9.375 mg total) by mouth 2 (two) times daily with a meal. Needs appt for further refills   ENTRESTO 97-103 MG TAKE 1 TABLET BY MOUTH IN THE MORNING AND AT BEDTIME. LAST REFILL WITHOUT OFFICE VISIT   FARXIGA 10 MG TABS tablet TAKE 1 TABLET BY MOUTH EVERY DAY  BEFORE BREAKFAST D/C JARDIANCE   furosemide (LASIX) 20 MG tablet TAKE 1 TABLET BY MOUTH DAILY AS NEEDED FOR FLUID OR EDEMA.   glucose blood (CHOICE DM FORA G20 TEST STRIPS) test strip Use as instructed   glucose monitoring kit (FREESTYLE) monitoring kit 1 each by Does not apply route as needed for other.   hydrALAZINE (APRESOLINE) 50 MG tablet Take 3 tablets (150 mg total) by mouth 3 (three) times daily. NEED APPOINTMENT FOR FUTURE REFILLS   isosorbide mononitrate (IMDUR) 30 MG 24 hr tablet TAKE 1 TABLET BY MOUTH EVERY DAY   Lancets (FREESTYLE) lancets Use as  instructed   metFORMIN (GLUCOPHAGE) 1000 MG tablet Take 1,000 mg by mouth 2 (two) times daily with a meal.   spironolactone (ALDACTONE) 25 MG tablet Take 1 tablet (25 mg total) by mouth daily. Needs appt for further refills   TRULICITY 2.82 SU/0.1VI SOPN INJECT 0.5 MLS (0.75MG) SUBCUTANEOUSLY ONCE A WEEK    Allergies  Allergen Reactions   Amoxicillin Rash   Nickel Rash      Review of Systems negative except from HPI and PMH  Physical Exam BP 120/70   Pulse 74   Ht _0  (1.727 m)   Wt 211 lb 12.8 oz (96.1 kg)   SpO2 95%   BMI 32.20 kg/m  Well developed and well nourished in no acute distress HENT normal Neck supple with JVP-flat Clear Device pocket well healed; without hematoma or erythema.  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

## 2021-02-01 NOTE — Patient Instructions (Signed)
Medication Instructions:  Your physician recommends that you continue on your current medications as directed. Please refer to the Current Medication list given to you today.  Labwork: None ordered.  Testing/Procedures: None ordered.  Follow-Up: Your physician wants you to follow-up in: one year with Dr. Caryl Comes or one of the following Advanced Practice Providers on your designated Care Team:   Tommye Standard, [place] Legrand Como "Jonni Sanger" Chalmers Cater, [place]  Remote monitoring is used to monitor your ICD from home. This monitoring reduces the number of office visits required to check your device to one time per year. It allows [place] to keep an eye on the functioning of your device to ensure it is working properly. You are scheduled for a device check from home on 02/28/2021. You may send your transmission at any time that day. If you have a wireless device, the transmission will be sent automatically. After your physician reviews your transmission, you will receive a postcard with your next transmission date.  Any Other Special Instructions Will Be Listed Below (If Applicable).  If you need a refill on your cardiac medications before your next appointment, please call your pharmacy.

## 2021-02-08 ENCOUNTER — Telehealth: Payer: Self-pay | Admitting: Podiatry

## 2021-02-08 NOTE — Telephone Encounter (Signed)
Per [place] @ [employer] insurance the L5000/L3020 are valid codes and covered @ 70% after 3000.00 deductible(Pt has met 2116.85) out of pocket is 8550.00 (met 2769.64) 70 specialist copay.. ref # [id number] max amount is 1500.00 is only limitation.  Per Igsa B @ [employer] insurance the L5000/ L3020 do not need a prior auth.. ref # Igsa B 10.7.2022.. 

## 2021-02-12 ENCOUNTER — Other Ambulatory Visit (HOSPITAL_COMMUNITY): Payer: Self-pay | Admitting: Internal Medicine

## 2021-02-14 ENCOUNTER — Ambulatory Visit (INDEPENDENT_AMBULATORY_CARE_PROVIDER_SITE_OTHER): Payer: 59 | Admitting: *Deleted

## 2021-02-14 ENCOUNTER — Other Ambulatory Visit: Payer: Self-pay

## 2021-02-14 DIAGNOSIS — Z89431 Acquired absence of right foot: Secondary | ICD-10-CM

## 2021-02-14 DIAGNOSIS — Q666 Other congenital valgus deformities of feet: Secondary | ICD-10-CM | POA: Diagnosis not present

## 2021-02-14 NOTE — Progress Notes (Signed)
Patient presents today to pick up custom molded foot orthotics, diagnosed with pes planovalgus/status post transmet amputation by Dr. Posey Pronto.   Orthotics were dispensed and fit was satisfactory. Reviewed instructions for break-in and wear. Written instructions given to patient.   [facility] - order # [id number]   Patient also requested to be measured for diabetic shoes since his last pair was in 2020.   Patient was measured with brannock device to determine size and width for 1 pair of extra depth shoes and foam casted for 3 pair of insoles.   Documentation of medical necessity will be sent to patient's treating diabetic doctor to verify and sign.   Patient's diabetic provider: Dr. Tobie Lords  Shoes and insoles will be ordered at that time and patient will be notified for an appointment for fitting when they arrive.   Shoe size (per patient): 11.5   Brannock measurement: RIGHT - Transmet Amputation, LEFT - 11.5 D  Patient shoe selection-   1st choice:   Apex A4000M  Shoe size ordered: Men's 11.5 X-Wide  ~SHOES ONLY-INSOLES FROM [facility] DISPENSED TODAY~

## 2021-02-21 ENCOUNTER — Other Ambulatory Visit (HOSPITAL_COMMUNITY): Payer: Self-pay | Admitting: Internal Medicine

## 2021-02-25 ENCOUNTER — Telehealth: Payer: Self-pay | Admitting: Podiatry

## 2021-02-25 NOTE — Telephone Encounter (Signed)
Per [facility] @ [employer]) insurance ath diabetic shoes(A5500) are valid and billable covered @ 70% after 3000.00 deductible(pt has met 2944.78) out of pocket 8550.00(met 3792.45) ref # [id number]

## 2021-02-28 ENCOUNTER — Ambulatory Visit (INDEPENDENT_AMBULATORY_CARE_PROVIDER_SITE_OTHER): Payer: 59

## 2021-02-28 DIAGNOSIS — I255 Ischemic cardiomyopathy: Secondary | ICD-10-CM

## 2021-03-01 LAB — CUP PACEART REMOTE DEVICE CHECK
Battery Remaining Longevity: 150 mo
Battery Remaining Percentage: 100 %
Brady Statistic RV Percent Paced: 0 %
Date Time Interrogation Session: 20221027182500
HighPow Impedance: 61 Ohm
Implantable Lead Implant Date: 20190925
Implantable Lead Location: 753860
Implantable Lead Model: 293
Implantable Lead Serial Number: 441633
Implantable Pulse Generator Implant Date: 20190925
Lead Channel Impedance Value: 496 Ohm
Lead Channel Setting Pacing Amplitude: 2.5 V
Lead Channel Setting Pacing Pulse Width: 0.4 ms
Lead Channel Setting Sensing Sensitivity: 0.5 mV
Pulse Gen Serial Number: 244469

## 2021-03-07 NOTE — Progress Notes (Signed)
Remote ICD transmission.   

## 2021-03-13 ENCOUNTER — Other Ambulatory Visit: Payer: Self-pay

## 2021-03-13 ENCOUNTER — Ambulatory Visit (INDEPENDENT_AMBULATORY_CARE_PROVIDER_SITE_OTHER): Payer: 59 | Admitting: Podiatry

## 2021-03-13 DIAGNOSIS — E1165 Type 2 diabetes mellitus with hyperglycemia: Secondary | ICD-10-CM

## 2021-03-13 DIAGNOSIS — Q666 Other congenital valgus deformities of feet: Secondary | ICD-10-CM | POA: Diagnosis not present

## 2021-03-13 DIAGNOSIS — Z89431 Acquired absence of right foot: Secondary | ICD-10-CM

## 2021-03-20 ENCOUNTER — Encounter: Payer: Self-pay | Admitting: Podiatry

## 2021-03-20 ENCOUNTER — Other Ambulatory Visit (HOSPITAL_COMMUNITY): Payer: Self-pay | Admitting: *Deleted

## 2021-03-20 NOTE — Progress Notes (Signed)
Subjective:  Patient ID: Christopher Roth, male    DOB: Dec 13, 1970,  MRN: [id number]  Chief Complaint  Patient presents with   Diabetes    Diabetic shoes     50 y.o. male presents with the above complaint.  Patient presents with follow-up to left submetatarsal preulcerative callus.  He states he is here to pick up issues.  He has not noticed any ulceration is doing much better.  He denies any other acute complaints.   Review of Systems: Negative except as noted in the HPI. Denies N/V/F/Ch.  Past Medical History:  Diagnosis Date   Acute systolic congestive heart failure ([place]) 04/22/2016   AICD (automatic cardioverter/defibrillator) present 01/27/2018   [employer]   Diabetes mellitus with peripheral vascular disease ([place])    Type II   Diabetic foot ulcer ([place]) 01/31/2019   GERD (gastroesophageal reflux disease)    Hypertension    Osteomyelitis of ankle and foot ([place]) 02/22/2019   PVD (peripheral vascular disease) ([place])    Tobacco use disorder    intermittent cigar use now    Current Outpatient Medications:    albuterol (PROVENTIL HFA;VENTOLIN HFA) 108 (90 Base) MCG/ACT inhaler, Inhale 2 puffs into the lungs every 6 (six) hours as needed for wheezing or shortness of breath., Disp: , Rfl:    Alcohol Swabs PADS, 1 Package by Does not apply route as needed (with blood glucose checks)., Disp: 1 each, Rfl: 0   aspirin EC 81 MG tablet, Take 81 mg by mouth daily., Disp: , Rfl:    atorvastatin (LIPITOR) 80 MG tablet, TAKE 1 TABLET EVERY DAY, Disp: 90 tablet, Rfl: 3   carvedilol (COREG) 6.25 MG tablet, Take 1.5 tablets (9.375 mg total) by mouth 2 (two) times daily with a meal. Needs appt for further refills, Disp: 270 tablet, Rfl: 3   FARXIGA 10 MG TABS tablet, TAKE 1 TABLET BY MOUTH EVERY DAY BEFORE BREAKFAST D/C JARDIANCE, Disp: 30 tablet, Rfl: 11   furosemide (LASIX) 20 MG tablet, TAKE 1 TABLET BY MOUTH DAILY AS NEEDED FOR FLUID OR EDEMA., Disp: 90 tablet, Rfl: 1   glucose blood  (CHOICE DM FORA G20 TEST STRIPS) test strip, Use as instructed, Disp: 100 each, Rfl: 11   glucose monitoring kit (FREESTYLE) monitoring kit, 1 each by Does not apply route as needed for other., Disp: 1 each, Rfl: 0   hydrALAZINE (APRESOLINE) 50 MG tablet, Take 3 tablets (150 mg total) by mouth 3 (three) times daily. NEED APPOINTMENT FOR FUTURE REFILLS, Disp: 270 tablet, Rfl: 2   isosorbide mononitrate (IMDUR) 30 MG 24 hr tablet, TAKE 1 TABLET BY MOUTH EVERY DAY, Disp: 30 tablet, Rfl: 0   Lancets (FREESTYLE) lancets, Use as instructed, Disp: 100 each, Rfl: 11   metFORMIN (GLUCOPHAGE) 1000 MG tablet, Take 1,000 mg by mouth 2 (two) times daily with a meal., Disp: , Rfl:    sacubitril-valsartan (ENTRESTO) 97-103 MG, Take 1 tablet by mouth 2 (two) times daily., Disp: 60 tablet, Rfl: 2   spironolactone (ALDACTONE) 25 MG tablet, Take 1 tablet (25 mg total) by mouth daily. Needs appt for further refills, Disp: 90 tablet, Rfl: 0   TRULICITY 1.82 XH/3.7JI SOPN, INJECT 0.5 MLS (0.75MG) SUBCUTANEOUSLY ONCE A WEEK, Disp: , Rfl:   Social History   Tobacco Use  Smoking Status Some Days   Packs/day: 1.00   Years: 24.00   Pack years: 24.00   Types: Cigars, Cigarettes   Last attempt to quit: 03/27/2016   Years since quitting: 4.9  Smokeless Tobacco  Never  Tobacco Comments   h/o cigarettes with ongoing occasional cigar use    Allergies  Allergen Reactions   Amoxicillin Rash   Nickel Rash   Objective:  There were no vitals filed for this visit. There is no height or weight on file to calculate BMI. Constitutional Well developed. Well nourished.  Vascular Dorsalis pedis pulses palpable bilaterally. Posterior tibial pulses palpable bilaterally. Capillary refill normal to all digits.  No cyanosis or clubbing noted. Pedal hair growth normal.  Neurologic Normal speech. Oriented to person, place, and time. Epicritic sensation to light touch grossly present bilaterally.  Dermatologic  hyperkeratotic  lesion noted submetatarsal 1 on the left side without any concern for underlying wound.  No clinical signs of infection noted.  No pinpoint bleeding noted.  Orthopedic: Normal joint ROM without pain or crepitus bilaterally. No visible deformities. No bony tenderness.   Radiographs: None Assessment:   1. Pes planovalgus   2. Uncontrolled type 2 diabetes mellitus with hyperglycemia ([place])   3. Status post transmetatarsal amputation of foot, right ([place])      Plan:  Patient was evaluated and treated and all questions answered.  Left submetatarsal 1 preulcerative lesion/callus with underlying pes planovalgus -I explained to the patient the etiology of callus formation various treatment options were extensively discussed.  I discussed with him the importance of glucose management and diet control to help lower the A1c which will give him the best optimal outcome to prevent ulceration.  Patient states understanding will work on that.  At this time using chisel blade and handle the lesion was debrided down to healthy striated tissue.  No underlying ulceration noted.  No pinpoint bleeding noted. -Orthotics were dispensed/diabetic shoes.  No acute complaints. -I discussed with him the importance of orthotics and offloading.  If any future ulceration reoccurs come back and see me.  He states understanding  No follow-ups on file.

## 2021-04-05 ENCOUNTER — Other Ambulatory Visit (HOSPITAL_COMMUNITY): Payer: Self-pay | Admitting: *Deleted

## 2021-04-05 MED ORDER — ENTRESTO 97-103 MG PO TABS: 1.0000 | ORAL_TABLET | Freq: Two times a day (BID) | ORAL | 3 refills | Status: DC

## 2021-04-05 MED ORDER — ISOSORBIDE MONONITRATE ER 30 MG PO TB24: 30.0000 mg | ORAL_TABLET | Freq: Every day | ORAL | 3 refills | Status: DC

## 2021-04-05 MED ORDER — DAPAGLIFLOZIN PROPANEDIOL 10 MG PO TABS: 10.0000 mg | ORAL_TABLET | Freq: Every day | ORAL | 3 refills | Status: DC

## 2021-05-21 ENCOUNTER — Other Ambulatory Visit (HOSPITAL_COMMUNITY): Payer: Self-pay | Admitting: Adult Health

## 2021-05-30 ENCOUNTER — Ambulatory Visit (INDEPENDENT_AMBULATORY_CARE_PROVIDER_SITE_OTHER): Payer: 59

## 2021-05-30 DIAGNOSIS — I255 Ischemic cardiomyopathy: Secondary | ICD-10-CM

## 2021-06-02 LAB — CUP PACEART REMOTE DEVICE CHECK
Battery Remaining Longevity: 138 mo
Battery Remaining Percentage: 95 %
Brady Statistic RV Percent Paced: 0 %
Date Time Interrogation Session: 20230128182700
HighPow Impedance: 69 Ohm
Implantable Lead Implant Date: 20190925
Implantable Lead Location: 753860
Implantable Lead Model: 293
Implantable Lead Serial Number: 441633
Implantable Pulse Generator Implant Date: 20190925
Lead Channel Impedance Value: 497 Ohm
Lead Channel Setting Pacing Amplitude: 2.5 V
Lead Channel Setting Pacing Pulse Width: 0.4 ms
Lead Channel Setting Sensing Sensitivity: 0.5 mV
Pulse Gen Serial Number: 244469

## 2021-06-10 NOTE — Progress Notes (Signed)
.  defi

## 2021-06-11 ENCOUNTER — Other Ambulatory Visit (HOSPITAL_COMMUNITY): Payer: Self-pay | Admitting: Adult Health

## 2021-08-29 ENCOUNTER — Ambulatory Visit (INDEPENDENT_AMBULATORY_CARE_PROVIDER_SITE_OTHER): Payer: 59

## 2021-08-29 DIAGNOSIS — I255 Ischemic cardiomyopathy: Secondary | ICD-10-CM | POA: Diagnosis not present

## 2021-08-30 LAB — CUP PACEART REMOTE DEVICE CHECK
Battery Remaining Longevity: 138 mo
Battery Remaining Percentage: 95 %
Brady Statistic RV Percent Paced: 0 %
Date Time Interrogation Session: 20230427213600
HighPow Impedance: 71 Ohm
Implantable Lead Implant Date: 20190925
Implantable Lead Location: 753860
Implantable Lead Model: 293
Implantable Lead Serial Number: 441633
Implantable Pulse Generator Implant Date: 20190925
Lead Channel Impedance Value: 540 Ohm
Lead Channel Setting Pacing Amplitude: 2.5 V
Lead Channel Setting Pacing Pulse Width: 0.4 ms
Lead Channel Setting Sensing Sensitivity: 0.5 mV
Pulse Gen Serial Number: 244469

## 2021-09-04 ENCOUNTER — Encounter (HOSPITAL_COMMUNITY): Payer: 59

## 2021-09-04 ENCOUNTER — Other Ambulatory Visit (HOSPITAL_COMMUNITY): Payer: 59

## 2021-09-04 ENCOUNTER — Ambulatory Visit: Payer: 59 | Admitting: Vascular Surgery

## 2021-09-05 ENCOUNTER — Other Ambulatory Visit: Payer: Self-pay

## 2021-09-05 DIAGNOSIS — I739 Peripheral vascular disease, unspecified: Secondary | ICD-10-CM

## 2021-09-13 NOTE — Progress Notes (Signed)
Remote ICD transmission.   

## 2021-09-18 ENCOUNTER — Ambulatory Visit (INDEPENDENT_AMBULATORY_CARE_PROVIDER_SITE_OTHER)
Admission: RE | Admit: 2021-09-18 | Discharge: 2021-09-18 | Disposition: A | Discharge status: Home / Self Care | Payer: 59 | Source: Ambulatory Visit | Attending: Vascular Surgery | Admitting: Vascular Surgery

## 2021-09-18 ENCOUNTER — Ambulatory Visit (INDEPENDENT_AMBULATORY_CARE_PROVIDER_SITE_OTHER): Payer: 59 | Admitting: Vascular Surgery

## 2021-09-18 ENCOUNTER — Ambulatory Visit (HOSPITAL_COMMUNITY)
Admission: RE | Admit: 2021-09-18 | Discharge: 2021-09-18 | Disposition: A | Discharge status: Home / Self Care | Payer: 59 | Source: Ambulatory Visit | Attending: Vascular Surgery | Admitting: Vascular Surgery

## 2021-09-18 ENCOUNTER — Encounter: Payer: Self-pay | Admitting: Vascular Surgery

## 2021-09-18 VITALS — BP 122/78 | HR 69 | Temp 98.0°F | Resp 20 | Ht 68.0 in | Wt 222.0 lb

## 2021-09-18 DIAGNOSIS — I739 Peripheral vascular disease, unspecified: Secondary | ICD-10-CM

## 2021-09-18 NOTE — Progress Notes (Signed)
? ?Patient ID: Christopher Roth, male   DOB: Oct 16, 1970, 51 y.o.   MRN: [id number] ? ?Reason for Consult: Follow-up ?  ?Referred by Gregor Hams, FNP ? ?Subjective:  ?   ?HPI: ? ?[name] is a 51 y.o. male with history of hypertension, diabetes and previous right lower extremity endovascular intervention now with a healed transmetatarsal amputation.  He is on aspirin and statin.  He does continue to occasionally smoke.  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  He does not have any complaints today. ? ?Past Medical History:  ?Diagnosis Date  ? Acute systolic congestive heart failure ([place]) 04/22/2016  ? 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 [place]  ? Diabetes mellitus with peripheral vascular disease ([place])   ? Type II  ? Diabetic foot ulcer ([place]) 01/31/2019  ? GERD (gastroesophageal reflux disease)   ? Hypertension   ? Osteomyelitis of ankle and foot ([place]) 02/22/2019  ? PVD (peripheral vascular disease) ([place])   ? Tobacco use disorder   ? intermittent cigar use now  ? ?Family History  ?Problem Relation Age of Onset  ? Diabetes Mellitus II Mother   ? ?Past Surgical History:  ?Procedure Laterality Date  ? ABDOMINAL AORTOGRAM W/LOWER EXTREMITY Bilateral 01/18/2019  ? Procedure: ABDOMINAL AORTOGRAM W/LOWER EXTREMITY;  Surgeon: Serafina Mitchell, MD;  Location: [facility];  Service: Cardiovascular;  Laterality: Bilateral;  ? AMPUTATION Right 02/11/2019  ?  AMPUTATION OF TRANSMETATARSAL OF RIGHT FOOT. (Right Toe)  ? AMPUTATION TOE Right 02/11/2019  ? Procedure: AMPUTATION OF TRANSMETATARSAL OF RIGHT FOOT.;  Surgeon: Felipa Furnace, DPM;  Location: [facility];  Service: Podiatry;  Laterality: Right;  ? CARDIAC CATHETERIZATION N/A 04/23/2016  ? Procedure: Right/Left Heart Cath and Coronary Angiography;  Surgeon: Belva Crome, MD;  Location: [facility];  Service: Cardiovascular;  Laterality: N/A;  ? ICD IMPLANT N/A 01/27/2018  ? Procedure: ICD IMPLANT;  Surgeon:  Deboraha Sprang, MD;  Location: [facility];  Service: Cardiovascular;  Laterality: N/A;  ? INCISION AND DRAINAGE PERIRECTAL ABSCESS Right 11/17/2014  ? Procedure: debridement of right perianal and groin wound;  Surgeon: Johnathan Hausen, MD;  [facility];  Service: General;  Laterality: Right;  ? PERIPHERAL VASCULAR BALLOON ANGIOPLASTY Right 01/18/2019  ? Procedure: PERIPHERAL VASCULAR BALLOON ANGIOPLASTY;  Surgeon: Serafina Mitchell, MD;  Location: [facility];  Service: Cardiovascular;  Laterality: Right;  posterior tibial, peroneal  ? ? ?Short Social History:  ?Social History  ? ?Tobacco Use  ? Smoking status: Some Days  ?  Packs/day: 1.00  ?  Years: 24.00  ?  Pack years: 24.00  ?  Types: Cigars, Cigarettes  ?  Last attempt to quit: 03/27/2016  ?  Years since quitting: 5.4  ? Smokeless tobacco: Never  ? Tobacco comments:  ?  h/o cigarettes with ongoing occasional cigar use  ?Substance Use Topics  ? Alcohol use: Yes  ?  Comment: occ  ? ? ?Allergies  ?Allergen Reactions  ? Amoxicillin Rash  ? Nickel Rash  ? ? ?Current Outpatient Medications  ?Medication Sig Dispense Refill  ? albuterol (PROVENTIL HFA;VENTOLIN HFA) 108 (90 Base) MCG/ACT inhaler Inhale 2 puffs into the lungs every 6 (six) hours as needed for wheezing or shortness of breath.    ? Alcohol Swabs PADS 1 Package by Does not apply route as needed (with blood glucose checks). 1 each 0  ? aspirin EC 81 MG tablet Take 81 mg by  mouth daily.    ? atorvastatin (LIPITOR) 80 MG tablet TAKE 1 TABLET EVERY DAY 90 tablet 3  ? carvedilol (COREG) 6.25 MG tablet Take 1.5 tablets (9.375 mg total) by mouth 2 (two) times daily with a meal. Needs appt for further refills 270 tablet 3  ? dapagliflozin propanediol (FARXIGA) 10 MG TABS tablet Take 1 tablet (10 mg total) by mouth daily. 90 tablet 3  ? furosemide (LASIX) 20 MG tablet TAKE 1 TABLET BY MOUTH DAILY AS NEEDED FOR FLUID OR EDEMA. 90 tablet 1  ? glucose blood (CHOICE DM FORA G20 TEST STRIPS) test strip  Use as instructed 100 each 11  ? glucose monitoring kit (FREESTYLE) monitoring kit 1 each by Does not apply route as needed for other. 1 each 0  ? hydrALAZINE (APRESOLINE) 50 MG tablet Take 3 tablets (150 mg total) by mouth 3 (three) times daily. PLEASE CALL OFFICE TO SCHEDULE YOUR FOLLOW UP APPOINTMENT 756 tablet 3  ? isosorbide mononitrate (IMDUR) 30 MG 24 hr tablet Take 1 tablet (30 mg total) by mouth daily. 90 tablet 3  ? Lancets (FREESTYLE) lancets Use as instructed 100 each 11  ? sacubitril-valsartan (ENTRESTO) 97-103 MG Take 1 tablet by mouth 2 (two) times daily. 180 tablet 3  ? spironolactone (ALDACTONE) 25 MG tablet Take 1 tablet (25 mg total) by mouth daily. Needs appt for further refills 90 tablet 0  ? TRULICITY 0.17 PZ/0.2HE SOPN INJECT 0.5 MLS (0.75MG) SUBCUTANEOUSLY ONCE A WEEK    ? metFORMIN (GLUCOPHAGE) 1000 MG tablet Take 1,000 mg by mouth 2 (two) times daily with a meal.    ? ?No current facility-administered medications for this visit.  ? ? ?Review of Systems  ?Constitutional:  Constitutional negative. ?HENT: HENT negative.  ?Eyes: Eyes negative.  ?Respiratory: Respiratory negative.  ?Cardiovascular: Cardiovascular negative.  ?GI: Gastrointestinal negative.  ?Musculoskeletal: Musculoskeletal negative.  ?Skin: Skin negative.  ?Neurological: Neurological negative. ?Hematologic: Hematologic/lymphatic negative.  ?Psychiatric: Psychiatric negative.   ? ?   ?Objective:  ?Objective  ? ?Vitals:  ? 09/18/21 1604  ?BP: 122/78  ?Pulse: 69  ?Resp: 20  ?Temp: 98 ?F (36.7 ?C)  ?SpO2: 95%  ?Weight: 222 lb (100.7 kg)  ?Height: 5' 8"  (1.727 m)  ? ?Body mass index is 33.75 kg/m?. ? ?Physical Exam ?HENT:  ?   Head: Normocephalic.  ?   Nose: Nose normal.  ?   Mouth/Throat:  ?   Mouth: Mucous membranes are moist.  ?Eyes:  ?   Pupils: Pupils are equal, round, and reactive to light.  ?Cardiovascular:  ?   Rate and Rhythm: Normal rate.  ?   Pulses: Normal pulses.  ?Pulmonary:  ?   Effort: Pulmonary effort is normal.   ?Abdominal:  ?   General: Abdomen is flat.  ?   Palpations: Abdomen is soft. There is no mass.  ?Musculoskeletal:     ?   General: Normal range of motion.  ?   Cervical back: Normal range of motion and neck supple.  ?   Right lower leg: No edema.  ?   Left lower leg: No edema.  ?Skin: ?   General: Skin is warm and dry.  ?   Capillary Refill: Capillary refill takes less than 2 seconds.  ?Neurological:  ?   General: No focal deficit present.  ?   Mental Status: He is alert.  ?Psychiatric:     ?   Mood and Affect: Mood normal.  ? ? ?Data: ?Current ABI:  Right: 1.10 , Left 1.21  ? ?Performing Technologist: Delorise Shiner RVT  ? ?   ?Examination Guidelines: A complete evaluation includes B-mode imaging,  ?spectral  ?Doppler, color Doppler, and power Doppler as needed of all accessible  ?portions  ?of each vessel. Bilateral testing is considered an integral part of a  ?complete  ?examination. Limited examinations for reoccurring indications may be  ?performed  ?as noted.  ? ?   ? ? ?+-----------+--------+-----+--------+---------+--------+  ?RIGHT      PSV cm/sRatioStenosisWaveform Comments  ?+-----------+--------+-----+--------+---------+--------+  ?CFA Distal 125                  triphasic          ?+-----------+--------+-----+--------+---------+--------+  ?DFA        65                   biphasic           ?+-----------+--------+-----+--------+---------+--------+  ?SFA Prox   88                   triphasic          ?+-----------+--------+-----+--------+---------+--------+  ?SFA Mid    72                   triphasic          ?+-----------+--------+-----+--------+---------+--------+  ?SFA Distal 120                  triphasic          ?+-----------+--------+-----+--------+---------+--------+  ?POP Prox   56                   triphasic          ?+-----------+--------+-----+--------+---------+--------+  ?POP Distal 88                   triphasic           ?+-----------+--------+-----+--------+---------+--------+  ?ATA Prox   93                   triphasic          ?+-----------+--------+-----+--------+---------+--------+  ?ATA Distal 64                   triphasic          ?+-----------+--------+-----+--------+---

## 2021-09-18 NOTE — Progress Notes (Deleted)
? ?Patient ID: Christopher Roth, male   DOB: 08/19/1970, 50 y.o.   MRN: [id number] ? ?Reason for Consult: Follow-up ?  ?Referred by Anderson, Shane D, FNP ? ?Subjective:  ?   ?HPI: ? ?Christopher Roth is a 50 y.o. male history of hypertension, diabetes and a previous right lower extremity endovascular intervention of his anterior tibial artery that healed a transmetatarsal amputation.  Continues on aspirin.  Does continue to smoke.  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  He does not have any complaints today. ? ?Past Medical History:  ?Diagnosis Date  ? Acute systolic congestive heart failure (HCC) 04/22/2016  ? 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 [employer]  ? Diabetes mellitus with peripheral vascular disease (HCC)   ? Type II  ? Diabetic foot ulcer (HCC) 01/31/2019  ? GERD (gastroesophageal reflux disease)   ? Hypertension   ? Osteomyelitis of ankle and foot (HCC) 02/22/2019  ? PVD (peripheral vascular disease) (HCC)   ? Tobacco use disorder   ? intermittent cigar use now  ? ?Family History  ?Problem Relation Age of Onset  ? Diabetes Mellitus II Mother   ? ?Past Surgical History:  ?Procedure Laterality Date  ? ABDOMINAL AORTOGRAM W/LOWER EXTREMITY Bilateral 01/18/2019  ? Procedure: ABDOMINAL AORTOGRAM W/LOWER EXTREMITY;  Surgeon: Brabham, Vance W, MD;  Location: [facility];  Service: Cardiovascular;  Laterality: Bilateral;  ? AMPUTATION Right 02/11/2019  ?  AMPUTATION OF TRANSMETATARSAL OF RIGHT FOOT. (Right Toe)  ? AMPUTATION TOE Right 02/11/2019  ? Procedure: AMPUTATION OF TRANSMETATARSAL OF RIGHT FOOT.;  Surgeon: Patel, Kevin P, DPM;  Location: [facility];  Service: Podiatry;  Laterality: Right;  ? CARDIAC CATHETERIZATION N/A 04/23/2016  ? Procedure: Right/Left Heart Cath and Coronary Angiography;  Surgeon: Henry W Smith, MD;  Location: [facility];  Service: Cardiovascular;  Laterality: N/A;  ? ICD IMPLANT N/A 01/27/2018  ? Procedure: ICD  IMPLANT;  Surgeon: Klein, Steven C, MD;  Location: [facility];  Service: Cardiovascular;  Laterality: N/A;  ? INCISION AND DRAINAGE PERIRECTAL ABSCESS Right 11/17/2014  ? Procedure: debridement of right perianal and groin wound;  Surgeon: Matthew Martin, MD;  Location: [facility];  Service: General;  Laterality: Right;  ? PERIPHERAL VASCULAR BALLOON ANGIOPLASTY Right 01/18/2019  ? Procedure: PERIPHERAL VASCULAR BALLOON ANGIOPLASTY;  Surgeon: Brabham, Vance W, MD;  Location: [facility];  Service: Cardiovascular;  Laterality: Right;  posterior tibial, peroneal  ? ? ?Short Social History:  ?Social History  ? ?Tobacco Use  ? Smoking status: Some Days  ?  Packs/day: 1.00  ?  Years: 24.00  ?  Pack years: 24.00  ?  Types: Cigars, Cigarettes  ?  Last attempt to quit: 03/27/2016  ?  Years since quitting: 5.4  ? Smokeless tobacco: Never  ? Tobacco comments:  ?  h/o cigarettes with ongoing occasional cigar use  ?Substance Use Topics  ? Alcohol use: Yes  ?  Comment: occ  ? ? ?Allergies  ?Allergen Reactions  ? Amoxicillin Rash  ? Nickel Rash  ? ? ?Current Outpatient Medications  ?Medication Sig Dispense Refill  ? albuterol (PROVENTIL HFA;VENTOLIN HFA) 108 (90 Base) MCG/ACT inhaler Inhale 2 puffs into the lungs every 6 (six) hours as needed for wheezing or shortness of breath.    ? Alcohol Swabs PADS 1 Package by Does not apply route as needed (with blood glucose checks). 1 each 0  ? aspirin EC 81 MG tablet Take   81 mg by mouth daily.    ? atorvastatin (LIPITOR) 80 MG tablet TAKE 1 TABLET EVERY DAY 90 tablet 3  ? carvedilol (COREG) 6.25 MG tablet Take 1.5 tablets (9.375 mg total) by mouth 2 (two) times daily with a meal. Needs appt for further refills 270 tablet 3  ? dapagliflozin propanediol (FARXIGA) 10 MG TABS tablet Take 1 tablet (10 mg total) by mouth daily. 90 tablet 3  ? furosemide (LASIX) 20 MG tablet TAKE 1 TABLET BY MOUTH DAILY AS NEEDED FOR FLUID OR EDEMA. 90 tablet 1  ? glucose blood (CHOICE DM FORA G20 TEST  STRIPS) test strip Use as instructed 100 each 11  ? glucose monitoring kit (FREESTYLE) monitoring kit 1 each by Does not apply route as needed for other. 1 each 0  ? hydrALAZINE (APRESOLINE) 50 MG tablet Take 3 tablets (150 mg total) by mouth 3 (three) times daily. PLEASE CALL OFFICE TO SCHEDULE YOUR FOLLOW UP APPOINTMENT 756 tablet 3  ? isosorbide mononitrate (IMDUR) 30 MG 24 hr tablet Take 1 tablet (30 mg total) by mouth daily. 90 tablet 3  ? Lancets (FREESTYLE) lancets Use as instructed 100 each 11  ? sacubitril-valsartan (ENTRESTO) 97-103 MG Take 1 tablet by mouth 2 (two) times daily. 180 tablet 3  ? spironolactone (ALDACTONE) 25 MG tablet Take 1 tablet (25 mg total) by mouth daily. Needs appt for further refills 90 tablet 0  ? TRULICITY 2.95 AO/1.3YQ SOPN INJECT 0.5 MLS (0.75MG) SUBCUTANEOUSLY ONCE A WEEK    ? metFORMIN (GLUCOPHAGE) 1000 MG tablet Take 1,000 mg by mouth 2 (two) times daily with a meal.    ? ?No current facility-administered medications for this visit.  ? ? ?REVIEW OF SYSTEMS  ?Review of Systems  ?Constitutional: Negative.   ?HENT: Negative.    ?Eyes: Negative.   ?Respiratory: Negative.    ?Gastrointestinal: Negative.   ?Musculoskeletal: Negative.   ?Skin: Negative.   ?Neurological: Negative.   ?Endo/Heme/Allergies: Negative.   ?Psychiatric/Behavioral: Negative.    ? ?   ?Objective:  ?Objective  ?Vitals:  ? 09/18/21 1604  ?BP: 122/78  ?Pulse: 69  ?Resp: 20  ?Temp: 98 ?F (36.7 ?C)  ?SpO2: 95%  ? ? ?Physical Exam ?HENT:  ?   Head: Normocephalic.  ?   Mouth/Throat:  ?   Mouth: Mucous membranes are moist.  ?Eyes:  ?   Pupils: Pupils are equal, round, and reactive to light.  ?Cardiovascular:  ?   Rate and Rhythm: Normal rate.  ?   Pulses: Normal pulses.  ?Pulmonary:  ?   Effort: Pulmonary effort is normal.  ?Abdominal:  ?   General: Abdomen is flat.  ?   Palpations: Abdomen is soft.  ?Musculoskeletal:  ?   Comments: Well healed right TMA  ?Skin: ?   General: Skin is warm.  ?   Capillary Refill:  Capillary refill takes less than 2 seconds.  ?Neurological:  ?   General: No focal deficit present.  ?   Mental Status: He is alert.  ?Psychiatric:     ?   Mood and Affect: Mood normal.  ? ? ? ?Data: ?ABI Findings:  ?+--------+------------------+-----+--------+--------+  ?Right   Rt Pressure (mmHg)IndexWaveformComment   ?+--------+------------------+-----+--------+--------+  ?[id number]                                      ?+--------+------------------+-----+--------+--------+  ?PTA     116  0.93 biphasic          ?+--------+------------------+-----+--------+--------+  ?DP      138               1.10 biphasic          ?+--------+------------------+-----+--------+--------+  ? ?+---------+------------------+-----+--------+-------+  ?Left     Lt Pressure (mmHg)IndexWaveformComment  ?+---------+------------------+-----+--------+-------+  ?Brachial 125                                     ?+---------+------------------+-----+--------+-------+  ?PTA      151               1.21 biphasic         ?+---------+------------------+-----+--------+-------+  ?DP       128               1.02 biphasic         ?+---------+------------------+-----+--------+-------+  ?Great Toe93                0.74                  ?+---------+------------------+-----+--------+-------+  ? ?+-------+-----------+-----------+------------+------------+  ?ABI/TBIToday's ABIToday's TBIPrevious ABIPrevious TBI  ?+-------+-----------+-----------+------------+------------+  ?Right  1.10       amputation 0.98        amputation    ?+-------+-----------+-----------+------------+------------+  ?Left   1.21       0.74       0.97        0.66          ?+-------+-----------+-----------+------------+------------+  ? ? ?   ?Bilateral ABIs appear essentially unchanged compared to prior study on  ?08/10/2020.  ?   ?Summary:  ?Right: Resting right ankle-brachial index is within normal  range. No  ?evidence of significant right lower extremity arterial disease.  ? ?Left: Resting left ankle-brachial index is within normal range. No  ?evidence of significant left lower extremity arterial disease. The left  ?toe-brachial index is normal.  ? ? ?RIGHT      PSV cm/sRat

## 2021-09-21 ENCOUNTER — Other Ambulatory Visit (HOSPITAL_COMMUNITY): Payer: Self-pay | Admitting: Internal Medicine

## 2021-11-28 ENCOUNTER — Ambulatory Visit (INDEPENDENT_AMBULATORY_CARE_PROVIDER_SITE_OTHER): Payer: 59

## 2021-11-28 DIAGNOSIS — I255 Ischemic cardiomyopathy: Secondary | ICD-10-CM | POA: Diagnosis not present

## 2021-11-28 LAB — CUP PACEART REMOTE DEVICE CHECK
Battery Remaining Longevity: 138 mo
Battery Remaining Percentage: 95 %
Brady Statistic RV Percent Paced: 0 %
Date Time Interrogation Session: 20230727043200
HighPow Impedance: 78 Ohm
Implantable Lead Implant Date: 20190925
Implantable Lead Location: 753860
Implantable Lead Model: 293
Implantable Lead Serial Number: 441633
Implantable Pulse Generator Implant Date: 20190925
Lead Channel Impedance Value: 617 Ohm
Lead Channel Setting Pacing Amplitude: 2.5 V
Lead Channel Setting Pacing Pulse Width: 0.4 ms
Lead Channel Setting Sensing Sensitivity: 0.5 mV
Pulse Gen Serial Number: 244469

## 2021-12-03 ENCOUNTER — Other Ambulatory Visit (HOSPITAL_COMMUNITY): Payer: Self-pay | Admitting: Internal Medicine

## 2021-12-19 NOTE — Progress Notes (Signed)
Remote ICD transmission.   

## 2022-01-06 ENCOUNTER — Other Ambulatory Visit (HOSPITAL_COMMUNITY): Payer: Self-pay | Admitting: Internal Medicine

## 2022-01-08 NOTE — Progress Notes (Signed)
[facility] Note   PCP: Gregor Hams, FNP Primary Cardiologist: Dr Stanford Breed HF Cardiologist: Dr. [name]  HPI: Christopher Roth is a 51 y.o. male with HTN, DM2, Tobacco abuse, CAD, PAD and systolic CHF. S/P [employer] ICD 01/2018.   Admitted 12/17 with new onest HF. Echo EF 25-30%, Grade 2 DD. Diuresed 12 lbs. LHC recent occlusion of LAD.   cMRI 07/16/16 -> recalculated 08/19/16: EF 41% scar in LAD distribution. No viability   Echo (2/21): EF 30-35%  s/p RLE endovascular intervention and R TMA for osteomyelitis October 2020. Followed by Dr. Donzetta Matters.  Echo (9/22): EF 45-50%, mild LVH, grade I DD, RV mildly reduced.  Today he returns for HF follow up. We last saw him 8/22. Overall feeling fine. He works full time at [employer], physical work. He has no SOB with work duties. Denies palpitations, CP, dizziness, edema, or PND/Orthopnea. Appetite ok. No fever or chills. Weight at home 222 pounds. Taking all medications. Smokes 1 cigar a couple time a week, stopped drinking ETOH a month ago. BP at home ~ 140/90s. Has not needed Lasix. Unable to tolerate CPAP.  Cardiac studies:  - Echo (9/22): EF 45-50%, mild LVH, grade I DD, RV mildly reduced  - Echo (2/21): EF 30-35%  - cMRI (07/16/16) -> recalculated 08/19/16: EF 41% scar in LAD distribution. No viability   - LHC (04/23/16) - Recent total occlusion of the proximal to mid LAD.  - 70% ramus intermedius, 65% ostial circumflex followed by 70% mid circumflex, 75% proximal LAD prior to the origin of a small to moderate size diagonal, and 90% mid PDA. - Mild PAH, PCWP 14 - LVEF 10-15% Hemodynamics RHC Procedural Findings: Hemodynamics (mmHg) RA mean 4 RV 39/1 PA 37/12 PCWP 14 AO 115/78 Cardiac Output (Fick) 5.63 Cardiac Index (Fick) 2.71  Past Medical History:  Diagnosis Date   Acute systolic congestive heart failure ([place]) 04/22/2016   AICD (automatic cardioverter/defibrillator) present 01/27/2018   [employer]   Diabetes mellitus with peripheral vascular disease ([place])    Type II   Diabetic foot ulcer ([place]) 01/31/2019   GERD (gastroesophageal reflux disease)    Hypertension    Osteomyelitis of ankle and foot ([place]) 02/22/2019   PVD (peripheral vascular disease) ([place])    Tobacco use disorder    intermittent cigar use now    Current Outpatient Medications  Medication Sig Dispense Refill   albuterol (PROVENTIL HFA;VENTOLIN HFA) 108 (90 Base) MCG/ACT inhaler Inhale 2 puffs into the lungs every 6 (six) hours as needed for wheezing or shortness of breath.     Alcohol Swabs PADS 1 Package by Does not apply route as needed (with blood glucose checks). 1 each 0   aspirin EC 81 MG tablet Take 81 mg by mouth daily.     atorvastatin (LIPITOR) 80 MG tablet TAKE 1 TABLET EVERY DAY 90 tablet 3   carvedilol (COREG) 6.25 MG tablet Take 1.5 tablets (9.375 mg total) by mouth 2 (two) times daily with a meal. Needs appt for further refills 270 tablet 3   dapagliflozin propanediol (FARXIGA) 10 MG TABS tablet Take 1 tablet (10 mg total) by mouth daily. 90 tablet 3   furosemide (LASIX) 20 MG tablet Take 1 tablet (20 mg total) by mouth as needed. PLEASE KEEP FOLLOW UP APPOINTMENT FOR MORE REFILLS 60 tablet 0   glipiZIDE (GLUCOTROL) 10 MG tablet Take 10 mg by mouth 2 (two) times daily.     glucose blood (CHOICE DM FORA  G20 TEST STRIPS) test strip Use as instructed 100 each 11   glucose monitoring kit (FREESTYLE) monitoring kit 1 each by Does not apply route as needed for other. 1 each 0   hydrALAZINE (APRESOLINE) 50 MG tablet Take 3 tablets (150 mg total) by mouth 3 (three) times daily. PLEASE CALL OFFICE TO SCHEDULE YOUR FOLLOW UP APPOINTMENT 756 tablet 3   isosorbide mononitrate (IMDUR) 30 MG 24 hr tablet Take 1 tablet (30 mg total) by mouth daily. 90 tablet 3   Lancets (FREESTYLE) lancets Use as instructed 100 each 11   sacubitril-valsartan (ENTRESTO) 97-103 MG Take 1 tablet by mouth 2 (two) times daily. 180  tablet 3   spironolactone (ALDACTONE) 25 MG tablet Take 1 tablet (25 mg total) by mouth daily. Needs appt for further refills 90 tablet 0   TRULICITY 9.19 TY/6.0AY SOPN INJECT 0.5 MLS (0.75MG) SUBCUTANEOUSLY ONCE A WEEK     No current facility-administered medications for this encounter.    Allergies  Allergen Reactions   Amoxicillin Rash   Nickel Rash     Social History   Socioeconomic History   Marital status: Married    Spouse name: Not on file   Number of children: Not on file   Years of education: Not on file   Highest education level: Not on file  Occupational History   Not on file  Tobacco Use   Smoking status: Some Days    Packs/day: 1.00    Years: 24.00    Total pack years: 24.00    Types: Cigars, Cigarettes    Last attempt to quit: 03/27/2016    Years since quitting: 5.7   Smokeless tobacco: Never   Tobacco comments:    h/o cigarettes with ongoing occasional cigar use  Vaping Use   Vaping Use: Never used  Substance and Sexual Activity   Alcohol use: Yes    Comment: occ   Drug use: No   Sexual activity: Not on file  Other Topics Concern   Not on file  Social History Narrative   Not on file   Social Determinants of Health   Financial Resource Strain: Not on file  Food Insecurity: Not on file  Transportation Needs: Not on file  Physical Activity: Not on file  Stress: Not on file  Social Connections: Not on file  Intimate Partner Violence: Not on file   Family History  Problem Relation Age of Onset   Diabetes Mellitus II Mother    BP (!) 154/96   Pulse 69   Ht 5' 8"  (1.727 m)   Wt 97.4 kg (214 lb 12.8 oz)   SpO2 99%   BMI 32.66 kg/m   Wt Readings from Last 3 Encounters:  01/10/22 97.4 kg (214 lb 12.8 oz)  09/18/21 100.7 kg (222 lb)  02/01/21 96.1 kg (211 lb 12.8 oz)   PHYSICAL EXAM: General:  NAD. No resp difficulty HEENT: Normal Neck: Supple. No JVD. Carotids 2+ bilat; no bruits. No lymphadenopathy or thryomegaly appreciated. Cor:  PMI nondisplaced. Regular rate & rhythm. No rubs, gallops or murmurs. Lungs: Clear Abdomen: Soft, nontender, nondistended. No hepatosplenomegaly. No bruits or masses. Good bowel sounds. Extremities: No cyanosis, clubbing, rash, edema Neuro: Alert & oriented x 3, cranial nerves grossly intact. Moves all 4 extremities w/o difficulty. Affect pleasant.  ICD interrogation: HL Score 1, 3.4 hrs/day activity, no VT, average HR 85  ECG: NSR 68 bpm, RBBB, old anteroseptal infarct  ASSESSMENT & PLAN: 1. Chronic Combined Systolic and diastolic CHF -  ICM with Echo 04/2016 LVEF 25-30%, Grade 2 DD (EF 10-15% by cath) - Cath with chronic occlusion of LAD with no viability on MRI. Moderate non-obstructive CAD elsewhere - cMRI 07/16/16 -> recalculated 08/2016 EF 41%. Anterior scar with no significant viability - S/p [employer] ICD 01/2018  - Echo 2/21 EF 30-35% - Echo (9/22): EF 45-50%, mild LVH, grade I DD, RV mildly reduced. - NYHA I-II. Volume looks good today. Not requiring loop diuretic. - Increase carvedilol to 12.5 mg bid. - Continue Entresto 97/103 mg bid.  - Continue hydralazine 150 mg tid + Imdur 30 mg daily (was unable to afford Bidil)  - Continue spiro 25 mg daily.  - Continue Farxiga 10 mg daily. - Recent labs with PCP looks OK, SCr 1.33, K 4.1 - Repeat echo.  2. CAD via [place] 04/2016 - Chronic occlusion of LAD with no viability on MRI. Moderate non-obstructive CAD elsewhere - No chest pain. - Continue ASA and statin.  3. HTN - Elevated. - Increase Coreg as above. - I asked him to check bP at home and log. - Unable to tolerate CPAP, see # 7   4. DMII - A1c 9.5 (8/23) - Continue SGLT2i. - Managed by PCP  5. Tobacco abuse - Cut down but still smoking 1-2/week. - Recommend complete cessation.  6. PAD/R Foot Osteomyelitis - s/p PTA right anterior tibial and peroneal arteries followed by R foot TMA amputation 2020. - Has callus on plantar aspect of left foot.   - He has  podiatry follow up in 2 week.  7. OSA - PSG (6/21) AHI 5.3. - Has CPAP but unable to tolerate mask - Refer back to Dr. Radford Pax to discuss options.  Follow up in 6 months with Dr. [name] + echo.  Rafael Bihari, FNP  8:47 AM

## 2022-01-10 ENCOUNTER — Ambulatory Visit (HOSPITAL_COMMUNITY)
Admission: RE | Admit: 2022-01-10 | Discharge: 2022-01-10 | Disposition: A | Discharge status: Home / Self Care | Payer: 59 | Source: Ambulatory Visit | Attending: Family Medicine | Admitting: Family Medicine

## 2022-01-10 ENCOUNTER — Encounter (HOSPITAL_COMMUNITY): Payer: Self-pay

## 2022-01-10 VITALS — BP 154/96 | HR 69 | Ht 68.0 in | Wt 214.8 lb

## 2022-01-10 DIAGNOSIS — I2510 Atherosclerotic heart disease of native coronary artery without angina pectoris: Secondary | ICD-10-CM | POA: Diagnosis not present

## 2022-01-10 DIAGNOSIS — I739 Peripheral vascular disease, unspecified: Secondary | ICD-10-CM

## 2022-01-10 DIAGNOSIS — L84 Corns and callosities: Secondary | ICD-10-CM | POA: Diagnosis not present

## 2022-01-10 DIAGNOSIS — Z95810 Presence of automatic (implantable) cardiac defibrillator: Secondary | ICD-10-CM | POA: Diagnosis not present

## 2022-01-10 DIAGNOSIS — I10 Essential (primary) hypertension: Secondary | ICD-10-CM

## 2022-01-10 DIAGNOSIS — I25118 Atherosclerotic heart disease of native coronary artery with other forms of angina pectoris: Secondary | ICD-10-CM

## 2022-01-10 DIAGNOSIS — Z794 Long term (current) use of insulin: Secondary | ICD-10-CM

## 2022-01-10 DIAGNOSIS — M868X7 Other osteomyelitis, ankle and foot: Secondary | ICD-10-CM | POA: Diagnosis not present

## 2022-01-10 DIAGNOSIS — Z79899 Other long term (current) drug therapy: Secondary | ICD-10-CM | POA: Diagnosis not present

## 2022-01-10 DIAGNOSIS — Z7984 Long term (current) use of oral hypoglycemic drugs: Secondary | ICD-10-CM | POA: Insufficient documentation

## 2022-01-10 DIAGNOSIS — F17210 Nicotine dependence, cigarettes, uncomplicated: Secondary | ICD-10-CM | POA: Diagnosis not present

## 2022-01-10 DIAGNOSIS — I5022 Chronic systolic (congestive) heart failure: Secondary | ICD-10-CM

## 2022-01-10 DIAGNOSIS — E1159 Type 2 diabetes mellitus with other circulatory complications: Secondary | ICD-10-CM

## 2022-01-10 DIAGNOSIS — E1151 Type 2 diabetes mellitus with diabetic peripheral angiopathy without gangrene: Secondary | ICD-10-CM | POA: Diagnosis not present

## 2022-01-10 DIAGNOSIS — Z7985 Long-term (current) use of injectable non-insulin antidiabetic drugs: Secondary | ICD-10-CM | POA: Diagnosis not present

## 2022-01-10 DIAGNOSIS — I110 Hypertensive heart disease with heart failure: Secondary | ICD-10-CM | POA: Diagnosis not present

## 2022-01-10 DIAGNOSIS — Z720 Tobacco use: Secondary | ICD-10-CM

## 2022-01-10 DIAGNOSIS — F17290 Nicotine dependence, other tobacco product, uncomplicated: Secondary | ICD-10-CM | POA: Diagnosis not present

## 2022-01-10 DIAGNOSIS — I5042 Chronic combined systolic (congestive) and diastolic (congestive) heart failure: Secondary | ICD-10-CM | POA: Diagnosis not present

## 2022-01-10 DIAGNOSIS — G4733 Obstructive sleep apnea (adult) (pediatric): Secondary | ICD-10-CM | POA: Diagnosis not present

## 2022-01-10 MED ORDER — CARVEDILOL 12.5 MG PO TABS: 12.5000 mg | ORAL_TABLET | Freq: Two times a day (BID) | ORAL | 1 refills | Status: DC

## 2022-01-10 NOTE — Patient Instructions (Addendum)
Thank you for coming in today  No labs   EKG was done today   INCREASE Coreg to 12.5 mg 1  tablet twice daily   Dr. Malachy Mood office will contact you with appointment details for your CPAP  Your physician recommends that you schedule a follow-up appointment in:  6 months with Dr. Gala Roth with echocardiogram please call November December to make appointment for February 2024  Your physician has requested that you have an echocardiogram. Echocardiography is a painless test that uses sound waves to create images of your heart. It provides your doctor with information about the size and shape of your heart and how well your heart's chambers and valves are working. This procedure takes approximately one hour. There are no restrictions for this procedure.     Do the following things EVERYDAY: Weigh yourself in the morning before breakfast. Write it down and keep it in a log. Take your medicines as prescribed Eat low salt foods--Limit salt (sodium) to 2000 mg per day.  Stay as active as you can everyday Limit all fluids for the day to less than 2 liters  At the [facility], you and your health needs are our priority. As part of our continuing mission to provide you with exceptional heart care, we have created designated Provider Care Teams. These Care Teams include your primary Cardiologist (physician) and Advanced Practice Providers (APPs- Physician Assistants and Nurse Practitioners) who all work together to provide you with the care you need, when you need it.   You may see any of the following providers on your designated Care Team at your next follow up: Dr Arvilla Meres Dr Marca Ancona Dr. Marcos Eke, NP Robbie Lis, [place] [facility] [place], [place] Brynda Peon, NP Karle Plumber, PharmD   Please be sure to bring in all your medications bottles to every appointment.   If you have any questions or concerns before your next appointment  please send [place] a message through [place] or call our office at [phone].    TO LEAVE A MESSAGE FOR THE NURSE SELECT OPTION 2, PLEASE LEAVE A MESSAGE INCLUDING: YOUR NAME DATE OF BIRTH CALL BACK NUMBER REASON FOR CALL**this is important as we prioritize the call backs  YOU WILL RECEIVE A CALL BACK THE SAME DAY AS LONG AS YOU CALL BEFORE 4:00 PM

## 2022-01-12 ENCOUNTER — Other Ambulatory Visit (HOSPITAL_COMMUNITY): Payer: Self-pay | Admitting: Internal Medicine

## 2022-02-01 ENCOUNTER — Other Ambulatory Visit (HOSPITAL_COMMUNITY): Payer: Self-pay | Admitting: Internal Medicine

## 2022-02-27 ENCOUNTER — Ambulatory Visit (INDEPENDENT_AMBULATORY_CARE_PROVIDER_SITE_OTHER): Payer: 59

## 2022-02-27 DIAGNOSIS — I255 Ischemic cardiomyopathy: Secondary | ICD-10-CM

## 2022-03-06 LAB — CUP PACEART REMOTE DEVICE CHECK
Battery Remaining Longevity: 138 mo
Battery Remaining Percentage: 95 %
Brady Statistic RV Percent Paced: 0 %
Date Time Interrogation Session: 20231030045800
HighPow Impedance: 63 Ohm
Implantable Lead Connection Status: 753985
Implantable Lead Implant Date: 20190925
Implantable Lead Location: 753860
Implantable Lead Model: 293
Implantable Lead Serial Number: 441633
Implantable Pulse Generator Implant Date: 20190925
Lead Channel Impedance Value: 521 Ohm
Lead Channel Setting Pacing Amplitude: 2.5 V
Lead Channel Setting Pacing Pulse Width: 0.4 ms
Lead Channel Setting Sensing Sensitivity: 0.5 mV
Pulse Gen Serial Number: 244469

## 2022-03-07 NOTE — Progress Notes (Signed)
Remote ICD transmission.   

## 2022-04-09 ENCOUNTER — Ambulatory Visit (HOSPITAL_COMMUNITY)
Admission: RE | Admit: 2022-04-09 | Discharge: 2022-04-09 | Disposition: A | Discharge status: Home / Self Care | Payer: 59 | Source: Ambulatory Visit | Attending: Family Medicine | Admitting: Family Medicine

## 2022-04-09 DIAGNOSIS — I110 Hypertensive heart disease with heart failure: Secondary | ICD-10-CM | POA: Diagnosis not present

## 2022-04-09 DIAGNOSIS — I5022 Chronic systolic (congestive) heart failure: Secondary | ICD-10-CM

## 2022-04-09 DIAGNOSIS — F17200 Nicotine dependence, unspecified, uncomplicated: Secondary | ICD-10-CM | POA: Insufficient documentation

## 2022-04-09 LAB — ECHOCARDIOGRAM COMPLETE
AR max vel: 2.08 cm2
AV Area VTI: 1.94 cm2
AV Area mean vel: 1.88 cm2
AV Mean grad: 5 mmHg
AV Peak grad: 8.4 mmHg
Ao pk vel: 1.45 m/s
Area-P 1/2: 3.08 cm2
Calc EF: 53.5 %
S' Lateral: 3.9 cm
Single Plane A2C EF: 54.6 %
Single Plane A4C EF: 49.3 %

## 2022-04-09 NOTE — Progress Notes (Signed)
  Echocardiogram 2D Echocardiogram has been performed.  [name] 04/09/2022, 2:27 PM

## 2022-04-10 ENCOUNTER — Telehealth (HOSPITAL_COMMUNITY): Payer: Self-pay

## 2022-04-10 NOTE — Telephone Encounter (Signed)
Patient aware of results.

## 2022-04-11 ENCOUNTER — Other Ambulatory Visit (HOSPITAL_COMMUNITY): Payer: Self-pay | Admitting: Family Medicine

## 2022-04-15 ENCOUNTER — Other Ambulatory Visit (HOSPITAL_COMMUNITY): Payer: Self-pay | Admitting: Internal Medicine

## 2022-05-29 ENCOUNTER — Ambulatory Visit: Payer: 59 | Attending: Internal Medicine

## 2022-05-29 DIAGNOSIS — I255 Ischemic cardiomyopathy: Secondary | ICD-10-CM | POA: Diagnosis not present

## 2022-05-30 LAB — CUP PACEART REMOTE DEVICE CHECK
Battery Remaining Longevity: 132 mo
Battery Remaining Percentage: 92 %
Brady Statistic RV Percent Paced: 0 %
Date Time Interrogation Session: 20240125164900
HighPow Impedance: 63 Ohm
Implantable Lead Connection Status: 753985
Implantable Lead Implant Date: 20190925
Implantable Lead Location: 753860
Implantable Lead Model: 293
Implantable Lead Serial Number: 441633
Implantable Pulse Generator Implant Date: 20190925
Lead Channel Impedance Value: 471 Ohm
Lead Channel Setting Pacing Amplitude: 2.5 V
Lead Channel Setting Pacing Pulse Width: 0.4 ms
Lead Channel Setting Sensing Sensitivity: 0.5 mV
Pulse Gen Serial Number: 244469

## 2022-06-01 ENCOUNTER — Other Ambulatory Visit (HOSPITAL_COMMUNITY): Payer: Self-pay | Admitting: Internal Medicine

## 2022-06-17 NOTE — Progress Notes (Signed)
Remote ICD transmission.   

## 2022-06-26 ENCOUNTER — Other Ambulatory Visit (HOSPITAL_COMMUNITY): Payer: Self-pay | Admitting: Internal Medicine

## 2022-08-09 ENCOUNTER — Other Ambulatory Visit (HOSPITAL_COMMUNITY): Payer: Self-pay | Admitting: Internal Medicine

## 2022-08-09 DIAGNOSIS — E113399 Type 2 diabetes mellitus with moderate nonproliferative diabetic retinopathy without macular edema, unspecified eye: Secondary | ICD-10-CM

## 2022-08-28 ENCOUNTER — Ambulatory Visit (INDEPENDENT_AMBULATORY_CARE_PROVIDER_SITE_OTHER): Payer: 59

## 2022-08-28 DIAGNOSIS — I255 Ischemic cardiomyopathy: Secondary | ICD-10-CM

## 2022-09-01 LAB — CUP PACEART REMOTE DEVICE CHECK
Battery Remaining Longevity: 126 mo
Battery Remaining Percentage: 86 %
Brady Statistic RV Percent Paced: 0 %
Date Time Interrogation Session: 20240426181800
HighPow Impedance: 72 Ohm
Implantable Lead Connection Status: 753985
Implantable Lead Implant Date: 20190925
Implantable Lead Location: 753860
Implantable Lead Model: 293
Implantable Lead Serial Number: 441633
Implantable Pulse Generator Implant Date: 20190925
Lead Channel Impedance Value: 599 Ohm
Lead Channel Setting Pacing Amplitude: 2.5 V
Lead Channel Setting Pacing Pulse Width: 0.4 ms
Lead Channel Setting Sensing Sensitivity: 0.5 mV
Pulse Gen Serial Number: 244469

## 2022-09-17 ENCOUNTER — Other Ambulatory Visit: Payer: Self-pay | Admitting: *Deleted

## 2022-09-17 DIAGNOSIS — I739 Peripheral vascular disease, unspecified: Secondary | ICD-10-CM

## 2022-09-23 NOTE — Progress Notes (Signed)
Remote ICD transmission.   

## 2022-09-24 ENCOUNTER — Ambulatory Visit (HOSPITAL_COMMUNITY): Payer: 59

## 2022-09-24 ENCOUNTER — Ambulatory Visit: Payer: 59 | Admitting: Vascular Surgery

## 2022-11-05 ENCOUNTER — Ambulatory Visit (HOSPITAL_COMMUNITY)
Admission: RE | Admit: 2022-11-05 | Discharge: 2022-11-05 | Disposition: A | Discharge status: Home / Self Care | Payer: 59 | Source: Ambulatory Visit | Attending: Vascular Surgery | Admitting: Vascular Surgery

## 2022-11-05 ENCOUNTER — Ambulatory Visit (INDEPENDENT_AMBULATORY_CARE_PROVIDER_SITE_OTHER): Payer: 59 | Admitting: Vascular Surgery

## 2022-11-05 ENCOUNTER — Encounter: Payer: Self-pay | Admitting: Vascular Surgery

## 2022-11-05 ENCOUNTER — Ambulatory Visit (INDEPENDENT_AMBULATORY_CARE_PROVIDER_SITE_OTHER)
Admission: RE | Admit: 2022-11-05 | Discharge: 2022-11-05 | Disposition: A | Discharge status: Home / Self Care | Payer: 59 | Source: Ambulatory Visit | Attending: Vascular Surgery | Admitting: Vascular Surgery

## 2022-11-05 VITALS — BP 171/109 | HR 58 | Temp 98.0°F | Resp 20 | Ht 68.0 in | Wt 222.0 lb

## 2022-11-05 DIAGNOSIS — I739 Peripheral vascular disease, unspecified: Secondary | ICD-10-CM

## 2022-11-05 LAB — VAS US ABI WITH/WO TBI
Left ABI: 1.01
Right ABI: 0.92

## 2022-11-05 NOTE — Progress Notes (Signed)
Patient ID: Christopher Roth, male   DOB: 03/04/1971, 52 y.o.   MRN: [id number]  Reason for Consult: Follow-up   Referred by Maudie Flakes, FNP  Subjective:     HPI:  [name] is a 52 y.o. male has a history of right lower extremity sensation and healed right transmetatarsal amputation.  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  He does have swelling particularly of the left lower extremity at the end of the day no issues with claudication, tissue loss or ulceration.  Past Medical History:  Diagnosis Date   Acute systolic congestive heart failure (HCC) 04/22/2016   AICD (automatic cardioverter/defibrillator) present 01/27/2018   [employer]   Diabetes mellitus with peripheral vascular disease (HCC)    Type II   Diabetic foot ulcer (HCC) 01/31/2019   GERD (gastroesophageal reflux disease)    Hypertension    Osteomyelitis of ankle and foot (HCC) 02/22/2019   PVD (peripheral vascular disease) (HCC)    Tobacco use disorder    intermittent cigar use now   Family History  Problem Relation Age of Onset   Diabetes Mellitus II Mother    Past Surgical History:  Procedure Laterality Date   ABDOMINAL AORTOGRAM W/LOWER EXTREMITY Bilateral 01/18/2019   Procedure: ABDOMINAL AORTOGRAM W/LOWER EXTREMITY;  Surgeon: Nada Libman, MD;  Location: [facility] INVASIVE CV LAB;  Service: Cardiovascular;  Laterality: Bilateral;   AMPUTATION Right 02/11/2019    AMPUTATION OF TRANSMETATARSAL OF RIGHT FOOT. (Right Toe)   AMPUTATION TOE Right 02/11/2019   Procedure: AMPUTATION OF TRANSMETATARSAL OF RIGHT FOOT.;  Surgeon: Candelaria Stagers, DPM;  Location: [facility] OR;  Service: Podiatry;  Laterality: Right;   CARDIAC CATHETERIZATION N/A 04/23/2016   Procedure: Right/Left Heart Cath and Coronary Angiography;  Surgeon: Lyn Records, MD;  Location: [facility] INVASIVE CV LAB;  Service: Cardiovascular;  Laterality: N/A;   ICD IMPLANT N/A 01/27/2018   Procedure: ICD IMPLANT;  Surgeon: Duke Salvia, MD;  Location: [facility] INVASIVE CV LAB;  Service: Cardiovascular;  Laterality: N/A;   INCISION AND DRAINAGE PERIRECTAL ABSCESS Right 11/17/2014   Procedure: debridement of right perianal and groin wound;  Surgeon: Luretha Murphy, MD;  Location: [facility] ORS;  Service: General;  Laterality: Right;   PERIPHERAL VASCULAR BALLOON ANGIOPLASTY Right 01/18/2019   Procedure: PERIPHERAL VASCULAR BALLOON ANGIOPLASTY;  Surgeon: Nada Libman, MD;  Location: [facility] INVASIVE CV LAB;  Service: Cardiovascular;  Laterality: Right;  posterior tibial, peroneal    Short Social History:  Social History   Tobacco Use   Smoking status: Some Days    Packs/day: 1.00    Years: 24.00    Additional pack years: 0.00    Total pack years: 24.00    Types: Cigars, Cigarettes    Last attempt to quit: 03/27/2016    Years since quitting: 6.6   Smokeless tobacco: Never   Tobacco comments:    h/o cigarettes with ongoing occasional cigar use  Substance Use Topics   Alcohol use: Yes    Comment: occ    Allergies  Allergen Reactions   Amoxicillin Rash   Nickel Rash    Current Outpatient Medications  Medication Sig Dispense Refill   albuterol (PROVENTIL HFA;VENTOLIN HFA) 108 (90 Base) MCG/ACT inhaler Inhale 2 puffs into the lungs every 6 (six) hours as needed for wheezing or shortness of breath.     Alcohol Swabs PADS 1 Package by Does not apply route as needed (with blood glucose checks). 1 each 0   aspirin  EC 81 MG tablet Take 81 mg by mouth daily.     atorvastatin (LIPITOR) 80 MG tablet TAKE 1 TABLET EVERY DAY 90 tablet 3   carvedilol (COREG) 12.5 MG tablet TAKE 1 TABLET (12.5MG  TOTAL) BY MOUTH TWICE A DAY WITH MEALS 180 tablet 1   ENTRESTO 97-103 MG TAKE 1 TABLET BY MOUTH TWICE A DAY 180 tablet 3   FARXIGA 10 MG TABS tablet TAKE 1 TABLET BY MOUTH EVERY DAY 90 tablet 3   furosemide (LASIX) 20 MG tablet Take 1 tablet (20 mg total) by mouth as needed. 30 tablet 9   glipiZIDE (GLUCOTROL) 10 MG tablet Take 10 mg by mouth 2  (two) times daily.     glucose blood (CHOICE DM FORA G20 TEST STRIPS) test strip Use as instructed 100 each 11   glucose monitoring kit (FREESTYLE) monitoring kit 1 each by Does not apply route as needed for other. 1 each 0   hydrALAZINE (APRESOLINE) 50 MG tablet Take 3 tablets (150 mg total) by mouth 3 (three) times daily. PLEASE CALL OFFICE TO SCHEDULE YOUR FOLLOW UP APPOINTMENT 756 tablet 3   isosorbide mononitrate (IMDUR) 30 MG 24 hr tablet TAKE 1 TABLET BY MOUTH EVERY DAY 90 tablet 0   Lancets (FREESTYLE) lancets Use as instructed 100 each 11   spironolactone (ALDACTONE) 25 MG tablet Take 1 tablet (25 mg total) by mouth daily. Needs appt for further refills 90 tablet 0   TRULICITY 0.75 MG/0.5ML SOPN INJECT 0.5 MLS (0.75MG ) SUBCUTANEOUSLY ONCE A WEEK     No current facility-administered medications for this visit.    Review of Systems  Constitutional:  Constitutional negative. HENT: HENT negative.  Eyes: Eyes negative.  Cardiovascular: Positive for leg swelling.  GI: Gastrointestinal negative.  Musculoskeletal: Musculoskeletal negative.  Skin: Skin negative.  Neurological: Neurological negative. Hematologic: Hematologic/lymphatic negative.  Psychiatric: Psychiatric negative.        Objective:  Objective   Vitals:   11/05/22 1521  BP: (!) 171/109  Pulse: (!) 58  Resp: 20  Temp: 98 F (36.7 C)  SpO2: 98%  Weight: 222 lb (100.7 kg)  Height: 5\' 8"  (1.727 m)   Body mass index is 33.75 kg/m.  Physical Exam HENT:     Head: Normocephalic.     Nose: Nose normal.     Mouth/Throat:     Mouth: Mucous membranes are moist.  Eyes:     Pupils: Pupils are equal, round, and reactive to light.  Cardiovascular:     Rate and Rhythm: Normal rate.     Pulses:          Dorsalis pedis pulses are 2+ on the right side and 2+ on the left side.  Pulmonary:     Effort: Pulmonary effort is normal.  Abdominal:     General: Abdomen is flat.     Palpations: Abdomen is soft.   Musculoskeletal:     Comments: Well-healed right transmetatarsal amputation  Skin:    Capillary Refill: Capillary refill takes less than 2 seconds.  Neurological:     Mental Status: He is alert.  Psychiatric:        Mood and Affect: Mood normal.        Thought Content: Thought content normal.        Judgment: Judgment normal.     Data: RIGHT      PSV cm/sRatioStenosisWaveform     Comments                       +-----------+--------+-----+--------+-------------+------------------------  ----+  CFA Distal 85                   triphasic                                   +-----------+--------+-----+--------+-------------+------------------------  ----+  DFA       72                   triphasic                                   +-----------+--------+-----+--------+-------------+------------------------  ----+  SFA Prox   136                  triphasic                                   +-----------+--------+-----+--------+-------------+------------------------  ----+  SFA Mid    138                  triphasic                                   +-----------+--------+-----+--------+-------------+------------------------  ----+  SFA Distal 135                  triphasic                                   +-----------+--------+-----+--------+-------------+------------------------  ----+  POP Prox   83                   triphasic                                   +-----------+--------+-----+--------+-------------+------------------------  ----+  POP Distal 114                  triphasic                                   +-----------+--------+-----+--------+-------------+------------------------  ----+  TP Trunk   93                   triphasic                                   +-----------+--------+-----+--------+-------------+------------------------  ----+  ATA Prox   84                   triphasic                                    +-----------+--------+-----+--------+-------------+------------------------  ----+  ATA Mid    87                   triphasic                                   +-----------+--------+-----+--------+-------------+------------------------  ----+  ATA Distal 55                   triphasic                                   +-----------+--------+-----+--------+-------------+------------------------  ----+  PTA Prox   16                   monophasic                                  +-----------+--------+-----+--------+-------------+------------------------  ----+  PTA Mid    13                   monophasic                                  +-----------+--------+-----+--------+-------------+------------------------  ----+  PTA Distal 37                   monophasic   Reconstituted via  collateral  +-----------+--------+-----+--------+-------------+------------------------  ----+  PERO Prox  35                   barely                                                                      audible                                     +-----------+--------+-----+--------+-------------+------------------------  ----+  PERO Mid   38                   barely                                                                      audible                                     +-----------+--------+-----+--------+-------------+------------------------  ----+  PERO Distal32                   monophasic                                  +-----------+--------+-----+--------+-------------+------------------------  ----+     Summary:  Right: No evidence of restenosis involving the right proximal anterior  tibial artery.   The right peroneal artery is difficult to insonate, suggestive of possible  reocclusion/near-occlusion.   ABI Findings:  +--------+------------------+-----+---------+--------+  Right   Rt Pressure (mmHg)IndexWaveform Comment   +--------+------------------+-----+---------+--------+  [id number]                                      +--------+------------------+-----+---------+--------+  PTA    152               0.84 triphasic          +--------+------------------+-----+---------+--------+  DP     168               0.92 triphasic          +--------+------------------+-----+---------+--------+   +---------+------------------+-----+---------+-------+  Left    Lt Pressure (mmHg)IndexWaveform Comment  +---------+------------------+-----+---------+-------+  Brachial 182                                      +---------+------------------+-----+---------+-------+  PTA     184               1.01 triphasic         +---------+------------------+-----+---------+-------+  DP      159               0.87 triphasic         +---------+------------------+-----+---------+-------+  Ernst Spell               0.68                   +---------+------------------+-----+---------+-------+   +-------+-----------+-----------+------------+------------+  ABI/TBIToday's ABIToday's TBIPrevious ABIPrevious TBI  +-------+-----------+-----------+------------+------------+  Right 0.92       Amputation 1.10        Amputation    +-------+-----------+-----------+------------+------------+  Left  1.01       0.68       1.21        0.74          +-------+-----------+-----------+------------+------------+      Right ABIs appear decreased compared to prior study on 09/18/2021. Left  ABIs appear essentially unchanged compared to prior study on 09/18/2021.    Summary:  Right: Resting right ankle-brachial index indicates mild right lower  extremity arterial disease.   Left: Resting left ankle-brachial index is within normal range. The left  toe-brachial index is abnormal.        Assessment/Plan:    52 year old male with history of  right lower extremity endovascular revascularization to heal and right transmetatarsal amputation which remains well-healed without any lower extremity symptoms at this time.  Plan to follow-up in 1 year with repeat noninvasive studies.     Maeola Harman MD [facility]

## 2022-11-21 ENCOUNTER — Other Ambulatory Visit: Payer: Self-pay

## 2022-11-21 DIAGNOSIS — I739 Peripheral vascular disease, unspecified: Secondary | ICD-10-CM

## 2022-11-27 ENCOUNTER — Ambulatory Visit: Payer: 59

## 2023-01-16 ENCOUNTER — Encounter (HOSPITAL_COMMUNITY): Payer: 59 | Admitting: Cardiology

## 2023-01-19 ENCOUNTER — Ambulatory Visit: Payer: 59

## 2023-01-19 LAB — CUP PACEART REMOTE DEVICE CHECK
Battery Remaining Longevity: 120 mo
Battery Remaining Percentage: 83 %
Brady Statistic RV Percent Paced: 0 %
Date Time Interrogation Session: 20240914094100
HighPow Impedance: 62 Ohm
Implantable Lead Connection Status: 753985
Implantable Lead Implant Date: 20190925
Implantable Lead Location: 753860
Implantable Lead Model: 293
Implantable Lead Serial Number: 441633
Implantable Pulse Generator Implant Date: 20190925
Lead Channel Impedance Value: 492 Ohm
Lead Channel Setting Pacing Amplitude: 2.5 V
Lead Channel Setting Pacing Pulse Width: 0.4 ms
Lead Channel Setting Sensing Sensitivity: 0.5 mV
Pulse Gen Serial Number: 244469

## 2023-02-26 ENCOUNTER — Ambulatory Visit: Payer: 59

## 2023-03-10 ENCOUNTER — Other Ambulatory Visit (HOSPITAL_COMMUNITY): Payer: Self-pay

## 2023-03-10 MED ORDER — CARVEDILOL 12.5 MG PO TABS: 12.5000 mg | ORAL_TABLET | Freq: Two times a day (BID) | ORAL | 0 refills | Status: DC

## 2023-03-11 ENCOUNTER — Ambulatory Visit (INDEPENDENT_AMBULATORY_CARE_PROVIDER_SITE_OTHER): Payer: 59

## 2023-03-11 DIAGNOSIS — I255 Ischemic cardiomyopathy: Secondary | ICD-10-CM | POA: Diagnosis not present

## 2023-03-17 LAB — CUP PACEART REMOTE DEVICE CHECK
Battery Remaining Longevity: 126 mo
Battery Remaining Percentage: 86 %
Brady Statistic RV Percent Paced: 0 %
Date Time Interrogation Session: 20241106184900
HighPow Impedance: 68 Ohm
Implantable Lead Connection Status: 753985
Implantable Lead Implant Date: 20190925
Implantable Lead Location: 753860
Implantable Lead Model: 293
Implantable Lead Serial Number: 441633
Implantable Pulse Generator Implant Date: 20190925
Lead Channel Impedance Value: 541 Ohm
Lead Channel Setting Pacing Amplitude: 2.5 V
Lead Channel Setting Pacing Pulse Width: 0.4 ms
Lead Channel Setting Sensing Sensitivity: 0.5 mV
Pulse Gen Serial Number: 244469

## 2023-03-31 NOTE — Progress Notes (Signed)
Remote ICD transmission.   

## 2023-04-11 ENCOUNTER — Other Ambulatory Visit (HOSPITAL_COMMUNITY): Payer: Self-pay | Admitting: Internal Medicine

## 2023-04-15 ENCOUNTER — Other Ambulatory Visit (HOSPITAL_COMMUNITY): Payer: Self-pay | Admitting: Internal Medicine

## 2023-04-20 ENCOUNTER — Ambulatory Visit: Payer: 59

## 2023-05-27 ENCOUNTER — Telehealth (HOSPITAL_COMMUNITY): Payer: Self-pay | Admitting: Cardiology

## 2023-05-27 NOTE — Telephone Encounter (Signed)
Patient called to report he is admitted at [facility] pt reports he doesn't not feel comfortable with care given would like Dr Gala Romney to authorize transfer and or review records.  Reports extreme frustration with care, reports multiple procedures are being performed and no details as to why.    Would appreciate providers input    Advised to schedule followup once discharged, ask to speak with "inpatient service response" team to discuss concerns with current admission, and will forward to provider

## 2023-05-28 ENCOUNTER — Ambulatory Visit: Payer: 59

## 2023-06-10 ENCOUNTER — Ambulatory Visit (INDEPENDENT_AMBULATORY_CARE_PROVIDER_SITE_OTHER): Payer: 59

## 2023-06-10 DIAGNOSIS — I255 Ischemic cardiomyopathy: Secondary | ICD-10-CM

## 2023-06-10 LAB — CUP PACEART REMOTE DEVICE CHECK
Battery Remaining Longevity: 126 mo
Battery Remaining Percentage: 91 %
Brady Statistic RV Percent Paced: 0 %
Date Time Interrogation Session: 20250205044400
HighPow Impedance: 58 Ohm
Implantable Lead Connection Status: 753985
Implantable Lead Implant Date: 20190925
Implantable Lead Location: 753860
Implantable Lead Model: 293
Implantable Lead Serial Number: 441633
Implantable Pulse Generator Implant Date: 20190925
Lead Channel Impedance Value: 554 Ohm
Lead Channel Setting Pacing Amplitude: 2.5 V
Lead Channel Setting Pacing Pulse Width: 0.4 ms
Lead Channel Setting Sensing Sensitivity: 0.5 mV
Pulse Gen Serial Number: 244469

## 2023-06-17 NOTE — Progress Notes (Signed)
Advanced Heart Failure Clinic Note   PCP: Maudie Flakes, FNP Primary Cardiologist: Dr Jens Som HF Cardiologist: Dr. Gala Romney  HPI: Christopher Roth is a 53 y.o. male with HTN, DM2, Tobacco abuse, CAD, PAD and systolic CHF. S/P [employer] ICD 01/2018.   Admitted 12/17 with new onest HF. Echo EF 25-30%, Grade 2 DD. Diuresed 12 lbs. LHC recent occlusion of LAD.   cMRI 07/16/16 -> recalculated 08/19/16: EF 41% scar in LAD distribution. No viability   Echo (2/21): EF 30-35%  s/p RLE endovascular intervention and R TMA for osteomyelitis October 2020. Followed by Dr. Randie Heinz.  Echo (9/22): EF 45-50%, mild LVH, grade I DD, RV mildly reduced.  Today he returns for HF follow up. We last saw him 8/22. Overall feeling fine. He works full time at [employer], physical work. He has no SOB with work duties. Denies palpitations, CP, dizziness, edema, or PND/Orthopnea. Appetite ok. No fever or chills. Weight at home 222 pounds. Taking all medications. Smokes 1 cigar a couple time a week, stopped drinking ETOH a month ago. BP at home ~ 140/90s. Has not needed Lasix. Unable to tolerate CPAP.  Cardiac studies:  - Echo (9/22): EF 45-50%, mild LVH, grade I DD, RV mildly reduced  - Echo (2/21): EF 30-35%  - cMRI (07/16/16) -> recalculated 08/19/16: EF 41% scar in LAD distribution. No viability   - LHC (04/23/16) - Recent total occlusion of the proximal to mid LAD.  - 70% ramus intermedius, 65% ostial circumflex followed by 70% mid circumflex, 75% proximal LAD prior to the origin of a small to moderate size diagonal, and 90% mid PDA. 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     Alcohol Swabs PADS 1 Package by Does not apply route as needed (with blood glucose checks). 1 each 0   aspirin EC 81 MG tablet Take 81 mg by mouth daily.     atorvastatin (LIPITOR) 80 MG tablet TAKE 1 TABLET EVERY DAY 90 tablet 3   carvedilol (COREG) 12.5 MG tablet Take 1 tablet (12.5 mg total) by mouth 2 (two) times daily with a meal. 60 tablet 0   ENTRESTO 97-103 MG TAKE 1 TABLET BY MOUTH TWICE A DAY 180 tablet 3   FARXIGA 10 MG TABS tablet TAKE 1 TABLET BY MOUTH EVERY DAY 90 tablet 3   furosemide (LASIX) 20 MG tablet Take 1 tablet (20 mg total) by mouth as needed. 60 tablet 0   glipiZIDE (GLUCOTROL) 10 MG tablet Take 10 mg by mouth 2 (two) times daily.     glucose blood (CHOICE DM FORA G20  TEST STRIPS) test strip Use as instructed 100 each 11   glucose monitoring kit (FREESTYLE) monitoring kit 1 each by Does not apply route as needed for other. 1 each 0   hydrALAZINE (APRESOLINE) 50 MG tablet Take 3 tablets (150 mg total) by mouth 3 (three) times daily. PLEASE CALL OFFICE TO SCHEDULE YOUR FOLLOW UP APPOINTMENT 756 tablet 3   isosorbide mononitrate (IMDUR) 30 MG 24 hr tablet TAKE 1 TABLET BY MOUTH EVERY DAY 90 tablet 0   Lancets (FREESTYLE) lancets Use as instructed 100 each 11   spironolactone (ALDACTONE) 25 MG tablet Take 1 tablet (25 mg total) by mouth daily. Needs appt for further refills 90 tablet 0   TRULICITY 0.75 MG/0.5ML SOPN  INJECT 0.5 MLS (0.75MG ) SUBCUTANEOUSLY ONCE A WEEK     No current facility-administered medications for this visit.    Allergies  Allergen Reactions   Amoxicillin Rash   Nickel Rash     Social History   Socioeconomic History   Marital status: Married    Spouse name: Not on file   Number of children: Not on file   Years of education: Not on file   Highest education level: Not on file  Occupational History   Not on file  Tobacco Use   Smoking status: Some Days    Current packs/day: 0.00    Average packs/day: 1 pack/day for 24.0 years (24.0 ttl pk-yrs)    Types: Cigars, Cigarettes    Start date: 03/27/1992    Last attempt to quit: 03/27/2016    Years since quitting: 7.2   Smokeless tobacco: Never   Tobacco comments:    h/o cigarettes with ongoing occasional cigar use  Vaping Use   Vaping status: Never Used  Substance and Sexual Activity   Alcohol use: Yes    Comment: occ   Drug use: No   Sexual activity: Not on file  Other Topics Concern   Not on file  Social History Narrative   Not on file   Social Drivers of Health   Financial Resource Strain: Low Risk  (01/30/2023)   Received from [facility]   Overall Financial Resource Strain (CARDIA)    Difficulty of Paying Living Expenses: Not hard at all  Food Insecurity: No Food Insecurity (01/30/2023)   Received from [facility]   Hunger Vital Sign    Worried About Running Out of Food in the Last Year: Never true    Ran Out of Food in the Last Year: Never true  Transportation Needs: No Transportation Needs (01/30/2023)   Received from [facility] - Transportation    Lack of Transportation (Medical): No    Lack of Transportation (Non-Medical): No  Physical Activity: Unknown (01/30/2023)   Received from [facility]   Exercise Vital Sign    Days of Exercise per Week: 2 days    Minutes of Exercise per Session: Not on file  Stress: No Stress Concern Present (01/30/2023)   Received from [facility] - Occupational Stress Questionnaire    Feeling of Stress : Only a little  Social Connections: Socially Integrated (01/30/2023)   Received from [facility]   Social Network    How would you rate your social network (family, work, friends)?: Good participation with social networks  Intimate Partner Violence: Not At Risk (01/30/2023)   Received from [facility]   HITS    Over the last 12 months how often did your partner physically hurt  you?: Never    Over the last 12 months how often did your partner insult you or talk down to you?: Never    Over the last 12 months how often did your partner threaten you with physical harm?: Never    Over the last 12 months how often did your partner scream or curse at you?: Never   Family History  Problem Relation Age of Onset   Diabetes Mellitus II Mother    There were no vitals taken for this visit.  Wt Readings from Last 3 Encounters:  11/05/22 100.7 kg (222 lb)  01/10/22 97.4 kg (214 lb 12.8 oz)  09/18/21 100.7 kg (222 lb)   PHYSICAL EXAM: General:  NAD. No resp difficulty HEENT: Normal Neck: Supple. No JVD. Carotids 2+ bilat; no bruits. No lymphadenopathy or thryomegaly appreciated. Cor: PMI nondisplaced. Regular rate & rhythm. No rubs, gallops or murmurs. Lungs: Clear Abdomen: Soft, nontender, nondistended. No hepatosplenomegaly. No bruits or masses. Good bowel sounds. Extremities: No cyanosis, clubbing, rash, edema Neuro: Alert & oriented x 3, cranial nerves grossly intact. Moves all 4 extremities w/o difficulty. Affect pleasant.  ICD interrogation: HL Score 1, 3.4 hrs/day activity, no VT, average HR 85  ECG: NSR 68 bpm, RBBB, old anteroseptal infarct  ASSESSMENT & PLAN: 1. Chronic Combined Systolic and diastolic CHF -  ICM with Echo 82/9562 LVEF 25-30%, Grade 2 DD (EF 10-15% by cath) - Cath with chronic occlusion of LAD with no viability on MRI. Moderate non-obstructive CAD elsewhere - cMRI  07/16/16 -> recalculated 08/2016 EF 41%. Anterior scar with no significant viability - S/p [employer] ICD 01/2018  - Echo 2/21 EF 30-35% - Echo (9/22): EF 45-50%, mild LVH, grade I DD, RV mildly reduced. - NYHA I-II. Volume looks good today. Not requiring loop diuretic. - Increase carvedilol to 12.5 mg bid. - Continue Entresto 97/103 mg bid.  - Continue hydralazine 150 mg tid + Imdur 30 mg daily (was unable to afford Bidil)  - Continue spiro 25 mg daily.  - Continue Farxiga 10 mg daily. - Recent labs with PCP looks OK, SCr 1.33, K 4.1 - Repeat echo.  2. CAD via LHC 04/2016 - Chronic occlusion of LAD with no viability on MRI. Moderate non-obstructive CAD elsewhere - No chest pain. - Continue ASA and statin.  3. HTN - Elevated. - Increase Coreg as above. - I asked him to check bP at home and log. - Unable to tolerate CPAP, see # 7   4. DMII - A1c 9.5 (8/23) - Continue SGLT2i. - Managed by PCP  5. Tobacco abuse - Cut down but still smoking 1-2/week. - Recommend complete cessation.  6. PAD/R Foot Osteomyelitis - s/p PTA right anterior tibial and peroneal arteries followed by R foot TMA amputation 2020. - Has callus on plantar aspect of left foot.   - He has podiatry follow up in 2 week.  7. OSA - PSG (6/21) AHI 5.3. - Has CPAP but unable to tolerate mask - Refer back to Dr. Mayford Knife to discuss options.  Follow up in 6 months with Dr. Gala Romney + echo.  Jacklynn Ganong, FNP  2:18 PM

## 2023-06-18 ENCOUNTER — Telehealth (HOSPITAL_COMMUNITY): Payer: Self-pay

## 2023-06-18 NOTE — Telephone Encounter (Signed)
Called to confirm/remind patient of their appointment at the Advanced [facility] Failure Clinic on 06/19/23.   Patient reminded to bring all medications and/or complete list.  Confirmed patient has transportation. Gave directions, instructed to utilize valet parking.  Confirmed appointment prior to ending call.

## 2023-06-19 ENCOUNTER — Other Ambulatory Visit (HOSPITAL_COMMUNITY): Payer: Self-pay

## 2023-06-19 ENCOUNTER — Telehealth (HOSPITAL_COMMUNITY): Payer: Self-pay | Admitting: Pharmacy Technician

## 2023-06-19 ENCOUNTER — Encounter (HOSPITAL_COMMUNITY): Payer: Self-pay

## 2023-06-19 ENCOUNTER — Ambulatory Visit (HOSPITAL_COMMUNITY)
Admission: RE | Admit: 2023-06-19 | Discharge: 2023-06-19 | Disposition: A | Discharge status: Home / Self Care | Payer: 59 | Source: Ambulatory Visit | Attending: Family Medicine | Admitting: Family Medicine

## 2023-06-19 VITALS — BP 129/90 | HR 78 | Ht 68.0 in | Wt 222.0 lb

## 2023-06-19 DIAGNOSIS — Z720 Tobacco use: Secondary | ICD-10-CM

## 2023-06-19 DIAGNOSIS — I5042 Chronic combined systolic (congestive) and diastolic (congestive) heart failure: Secondary | ICD-10-CM | POA: Insufficient documentation

## 2023-06-19 DIAGNOSIS — I10 Essential (primary) hypertension: Secondary | ICD-10-CM

## 2023-06-19 DIAGNOSIS — I493 Ventricular premature depolarization: Secondary | ICD-10-CM | POA: Diagnosis not present

## 2023-06-19 DIAGNOSIS — I513 Intracardiac thrombosis, not elsewhere classified: Secondary | ICD-10-CM | POA: Diagnosis not present

## 2023-06-19 DIAGNOSIS — Z7984 Long term (current) use of oral hypoglycemic drugs: Secondary | ICD-10-CM | POA: Insufficient documentation

## 2023-06-19 DIAGNOSIS — I739 Peripheral vascular disease, unspecified: Secondary | ICD-10-CM

## 2023-06-19 DIAGNOSIS — Z7985 Long-term (current) use of injectable non-insulin antidiabetic drugs: Secondary | ICD-10-CM | POA: Diagnosis not present

## 2023-06-19 DIAGNOSIS — I5022 Chronic systolic (congestive) heart failure: Secondary | ICD-10-CM

## 2023-06-19 DIAGNOSIS — G4733 Obstructive sleep apnea (adult) (pediatric): Secondary | ICD-10-CM | POA: Insufficient documentation

## 2023-06-19 DIAGNOSIS — I25118 Atherosclerotic heart disease of native coronary artery with other forms of angina pectoris: Secondary | ICD-10-CM

## 2023-06-19 DIAGNOSIS — I742 Embolism and thrombosis of arteries of the upper extremities: Secondary | ICD-10-CM

## 2023-06-19 DIAGNOSIS — L905 Scar conditions and fibrosis of skin: Secondary | ICD-10-CM | POA: Insufficient documentation

## 2023-06-19 DIAGNOSIS — Z79899 Other long term (current) drug therapy: Secondary | ICD-10-CM | POA: Diagnosis not present

## 2023-06-19 DIAGNOSIS — Z794 Long term (current) use of insulin: Secondary | ICD-10-CM | POA: Insufficient documentation

## 2023-06-19 DIAGNOSIS — I110 Hypertensive heart disease with heart failure: Secondary | ICD-10-CM | POA: Diagnosis present

## 2023-06-19 DIAGNOSIS — E1151 Type 2 diabetes mellitus with diabetic peripheral angiopathy without gangrene: Secondary | ICD-10-CM | POA: Diagnosis not present

## 2023-06-19 DIAGNOSIS — F17290 Nicotine dependence, other tobacco product, uncomplicated: Secondary | ICD-10-CM | POA: Diagnosis not present

## 2023-06-19 DIAGNOSIS — Z7901 Long term (current) use of anticoagulants: Secondary | ICD-10-CM | POA: Insufficient documentation

## 2023-06-19 DIAGNOSIS — F17210 Nicotine dependence, cigarettes, uncomplicated: Secondary | ICD-10-CM | POA: Insufficient documentation

## 2023-06-19 DIAGNOSIS — I2510 Atherosclerotic heart disease of native coronary artery without angina pectoris: Secondary | ICD-10-CM | POA: Insufficient documentation

## 2023-06-19 DIAGNOSIS — Z91148 Patient's other noncompliance with medication regimen for other reason: Secondary | ICD-10-CM | POA: Insufficient documentation

## 2023-06-19 DIAGNOSIS — E1159 Type 2 diabetes mellitus with other circulatory complications: Secondary | ICD-10-CM

## 2023-06-19 LAB — COMPREHENSIVE METABOLIC PANEL
ALT: 29 U/L (ref 0–44)
AST: 20 U/L (ref 15–41)
Albumin: 3.6 g/dL (ref 3.5–5.0)
Alkaline Phosphatase: 68 U/L (ref 38–126)
Anion gap: 9 (ref 5–15)
BUN: 13 mg/dL (ref 6–20)
CO2: 24 mmol/L (ref 22–32)
Calcium: 9.5 mg/dL (ref 8.9–10.3)
Chloride: 106 mmol/L (ref 98–111)
Creatinine, Ser: 1.19 mg/dL (ref 0.61–1.24)
GFR, Estimated: >60 mL/min (ref >60)
Glucose, Bld: 114 mg/dL — ABNORMAL HIGH (ref 70–99)
Potassium: 4.4 mmol/L (ref 3.5–5.1)
Sodium: 139 mmol/L (ref 135–145)
Total Bilirubin: 0.6 mg/dL (ref 0.0–1.2)
Total Protein: 7.3 g/dL (ref 6.5–8.1)

## 2023-06-19 LAB — CBC
HCT: 32.5 % — ABNORMAL LOW (ref 39.0–52.0)
Hemoglobin: 10.4 g/dL — ABNORMAL LOW (ref 13.0–17.0)
MCH: 29.1 pg (ref 26.0–34.0)
MCHC: 32 g/dL (ref 30.0–36.0)
MCV: 90.8 fL (ref 80.0–100.0)
Platelets: 457 10*3/uL — ABNORMAL HIGH (ref 150–400)
RBC: 3.58 MIL/uL — ABNORMAL LOW (ref 4.22–5.81)
RDW: 16.6 % — ABNORMAL HIGH (ref 11.5–15.5)
WBC: 6.9 10*3/uL (ref 4.0–10.5)
nRBC: 0 % (ref 0.0–0.2)

## 2023-06-19 LAB — BRAIN NATRIURETIC PEPTIDE: B Natriuretic Peptide: 53.2 pg/mL (ref 0.0–100.0)

## 2023-06-19 MED ORDER — APIXABAN 5 MG PO TABS: 5.0000 mg | ORAL_TABLET | Freq: Two times a day (BID) | ORAL | 11 refills | Status: AC

## 2023-06-19 MED ORDER — APIXABAN 5 MG PO TABS: 5.0000 mg | ORAL_TABLET | Freq: Two times a day (BID) | ORAL | 3 refills | Status: DC

## 2023-06-19 MED ORDER — POTASSIUM CHLORIDE CRYS ER 20 MEQ PO TBCR: 20.0000 meq | EXTENDED_RELEASE_TABLET | Freq: Every day | ORAL | 3 refills | Status: DC

## 2023-06-19 MED ORDER — FUROSEMIDE 40 MG PO TABS: 40.0000 mg | ORAL_TABLET | Freq: Every day | ORAL | 3 refills | Status: DC

## 2023-06-19 NOTE — Patient Instructions (Addendum)
Thank you for coming in today  If you had labs drawn today, any labs that are abnormal the clinic will call you No news is good news  Medications: STOP Aspirin STOP Coumadin  START 06/21/2023 Eliquis 5 mg 1 tablet twice daily a copay card was given  START Lasix 40 mg 1 tablet daily START Potassium 20 meq 1 tablet daily   Follow up appointments:  Your physician recommends that you schedule a follow-up appointment in:  3 weeks Pharmacy 6 weeks in clinic   Do the following things EVERYDAY: Weigh yourself in the morning before breakfast. Write it down and keep it in a log. Take your medicines as prescribed Eat low salt foods--Limit salt (sodium) to 2000 mg per day.  Stay as active as you can everyday Limit all fluids for the day to less than 2 liters   At the Advanced [facility] Failure Clinic, you and your health needs are our priority. As part of our continuing mission to provide you with exceptional heart care, we have created designated Provider Care Teams. These Care Teams include your primary Cardiologist (physician) and Advanced Practice Providers (APPs- Physician Assistants and Nurse Practitioners) who all work together to provide you with the care you need, when you need it.   You may see any of the following providers on your designated Care Team at your next follow up: Dr Arvilla Meres Dr Marca Ancona Dr. Marcos Eke, NP Robbie Lis, [place] [facility] [place], [place] Brynda Peon, NP Karle Plumber, PharmD   Please be sure to bring in all your medications bottles to every appointment.    Thank you for choosing [facility]  If you have any questions or concerns before your next appointment please send [place] a message through [place] or call our office at [phone].    TO LEAVE A MESSAGE FOR THE NURSE SELECT OPTION 2, PLEASE LEAVE A MESSAGE INCLUDING: YOUR NAME DATE OF BIRTH CALL BACK NUMBER REASON  FOR CALL**this is important as we prioritize the call backs  YOU WILL RECEIVE A CALL BACK THE SAME DAY AS LONG AS YOU CALL BEFORE 4:00 PM

## 2023-06-19 NOTE — Progress Notes (Signed)
ReDS Vest / Clip - 06/19/23 1500       ReDS Vest / Clip   Station Marker D    Ruler Value 33    ReDS Value Range High volume overload    ReDS Actual Value 42

## 2023-06-19 NOTE — Telephone Encounter (Signed)
Pharmacy Patient Advocate Encounter  Insurance verification completed.   The patient is insured through [facility]   Ran test claim for Eliquis. Currently a quantity of 180 is a 90 day supply and the co-pay is $100 (co-pay card should bring it to $20).  This test claim was processed through [facility]- copay amounts may vary at other pharmacies due to pharmacy/plan contracts, or as the patient moves through the different stages of their insurance plan.   Archer Asa, CPhT

## 2023-06-22 ENCOUNTER — Other Ambulatory Visit (HOSPITAL_COMMUNITY): Payer: Self-pay

## 2023-06-22 DIAGNOSIS — I5022 Chronic systolic (congestive) heart failure: Secondary | ICD-10-CM

## 2023-06-22 NOTE — Progress Notes (Signed)
 bmet

## 2023-06-25 NOTE — Progress Notes (Incomplete)
***In Progress***    Advanced Heart Failure Clinic Note   PCP: Maudie Flakes, FNP Dr. Noah Delaine Primary Cardiologist: Dr Jens Som HF Cardiologist: Dr. Gala Romney    HPI:  Christopher Roth is a 53 y.o. male with HTN, DM2, Tobacco abuse, CAD, PAD and systolic CHF.    S/P Bos[employer]i ICD 01/2018.    Admitted 04/2016 with new onset HF. Echo EF 25-30%, Grade 2 DD. Diuresed 12 lbs. LHC recent occlusion of LAD.    cMRI 07/16/16 -> recalculated 08/19/16: EF 41% scar in LAD distribution. No viability    Echo (06/2019): EF 30-35%   s/p RLE endovascular intervention and R TMA for osteomyelitis 02/2019. Followed by Dr. Randie Heinz.   Echo (01/2021): EF 45-50%, mild LVH, grade I DD, RV mildly reduced.   Last seen in AHF clinic 01/2022. Echo 04/2022 EF 50-55%, mild LVH, G1DD, RV normal.   Admitted 05/2023 with left axillary artery thrombus => critical limb ischemia. Underwent LUE brachial artery thrombectomy, LUE forearm fasciotomy, carpal tunnel release, & hand fasciotomy. Required I&D and wound vac. Echo showed an LV thrombus, EF of 25-30%, RV not well seen but appeared reduced, akinetic apex with severe hypokinetic anteroseptum in the LAD territory. Cards consulted and AC started. Farxiga stopped, but Entresto, Lasix and spironolactone continued. He was discharged home, weight 217 lbs.   Returned to [facility] for HF follow up 06/19/23. We had not seen him since 01/2022. Overall was feeling fine. He had SOB walking further distances on flat ground. + bendopnea. Noted his right leg swells. He had rare, atypical chest pain, he attributes this to indigestion. Resolves spontaneously. Denied palpitations, abnormal bleeding, dizziness, or PND/Orthopnea. Appetite was ok. No fever or chills. Weight at home was 216 pounds. He had been off of Lasix, carvedilol, and hydralazine. Unable to tolerate CPAP, vapes daily. He works full time at [employer], physical work. Planning to start PT on arm soon and going back to work on  light duty the following week.   Today he returns to HF clinic for pharmacist medication titration. At last visit with APP, Lasix 40 mg daily and KCL 20 mEq daily were restarted. Additionally, warfarin and aspirin were discontinued and Eliquis was initiated.   Shortness of breath/dyspnea on exertion? {YES NO:22349}  Orthopnea/PND? {YES NO:22349} Edema? {YES NO:22349} Lightheadedness/dizziness? {YES NO:22349} Daily weights at home? {YES NO:22349} Blood pressure/heart rate monitoring at home? {YES [id number] Following low-sodium/fluid-restricted diet? {YES NO:22349}  HF Medications: Entresto 97/103 mg BID Spironolactone 25 mg daily Lasix 40 mg daily + KCL 20 mEq daily  Has the patient been experiencing any side effects to the medications prescribed?  {YES NO:22349}  Does the patient have any problems obtaining medications due to transportation or finances?   {YES NO:22349}  Understanding of regimen: {excellent/good/fair/poor:19665} Understanding of indications: {excellent/good/fair/poor:19665} Potential of compliance: {excellent/good/fair/poor:19665} Patient understands to avoid NSAIDs. Patient understands to avoid decongestants.    Pertinent Lab Values: Serum creatinine ***, BUN ***, Potassium ***, Sodium ***, BNP ***, Magnesium ***, Digoxin ***   Vital Signs: Weight: *** (last clinic weight: ***) Blood pressure: ***  Heart rate: ***   Assessment/Plan: 1. Chronic Combined Systolic and diastolic CHF -  ICM with Echo 16/1096 LVEF 25-30%, Grade 2 DD (EF 10-15% by cath) - Cath with chronic occlusion of LAD with no viability on MRI. Moderate non-obstructive CAD elsewhere - cMRI 07/16/16 -> recalculated 08/2016 EF 41%. Anterior scar with no significant viability - S/p [employer] ICD 01/2018  - Echo 06/2019 EF 30-35% -  Echo (01/2021): EF 45-50%, mild LVH, grade I DD, RV mildly reduced. - Echo (04/2022): EF 50-55%, mild LVH, G1DD, RV normal. - Echo (05/2023 at [facility]): EF 25-30%,  + thrombus - ? Drop in EF 2/2 to medication non compliance vs stress-mediated from acute illness. No chest pain. - NYHA II-IIb. Volume up today on exam and device interrogation from last week, REDs elevated at 42% *** - Continue Lasix 40 mg daily and KCL 20 mEq daily. - Continue Entresto 97/103 mg BID.  - Continue spironolactone 25 mg daily. - Hold off on SGLT2i with recent A1C of 12. - Restart Coreg when volume improved. *** - Plan to repeat echo when GDMT optimized.    2. CAD via LHC 04/2016 - Chronic occlusion of LAD with no viability on MRI. Moderate non-obstructive CAD elsewhere - No chest pain. - Continue statin. - Stop ASA since on Eliquis   3. HTN - BP stable today. - Meds as above   4. LV thrombus - Seen on echo 05/2023 at [facility]. - Coumadin was stopped and he was changed to Eliquis 06/19/23. Continue Eliquis.    5. DM II - A1C 05/16/23 was 12 - He is on insulin - Hold off on SGLT2i - Managed by PCP   6. Tobacco abuse - Now vaping. - Recommend complete cessation.   7. PAD - s/p PTA right anterior tibial and peroneal arteries followed by R foot TMA amputation 2020. - Followed by podiatry and vascular.   8. LUE thrombus - s/p LUE brachial artery thrombectomy, LUE forearm fasciotomy, - s/p I&D, delayed closure and L forearm skin graft - Followed by Plastics and Vascular   9. OSA - PSG (10/2019) AHI 5.3. - Has CPAP but unable to tolerate mask - Refer back to Dr. Mayford Knife to discuss options next visit  Follow up ***   Karle Plumber, PharmD, BCPS, BCCP, CPP Heart Failure Clinic Pharmacist [phone]

## 2023-07-06 ENCOUNTER — Ambulatory Visit (HOSPITAL_COMMUNITY)
Admission: RE | Admit: 2023-07-06 | Discharge: 2023-07-06 | Disposition: A | Discharge status: Home / Self Care | Source: Ambulatory Visit | Attending: Internal Medicine | Admitting: Internal Medicine

## 2023-07-06 ENCOUNTER — Other Ambulatory Visit (HOSPITAL_COMMUNITY): Payer: Self-pay

## 2023-07-06 VITALS — BP 168/92 | HR 75 | Wt 215.0 lb

## 2023-07-06 DIAGNOSIS — G4733 Obstructive sleep apnea (adult) (pediatric): Secondary | ICD-10-CM | POA: Insufficient documentation

## 2023-07-06 DIAGNOSIS — Z794 Long term (current) use of insulin: Secondary | ICD-10-CM | POA: Insufficient documentation

## 2023-07-06 DIAGNOSIS — I110 Hypertensive heart disease with heart failure: Secondary | ICD-10-CM | POA: Insufficient documentation

## 2023-07-06 DIAGNOSIS — I5042 Chronic combined systolic (congestive) and diastolic (congestive) heart failure: Secondary | ICD-10-CM | POA: Insufficient documentation

## 2023-07-06 DIAGNOSIS — E1151 Type 2 diabetes mellitus with diabetic peripheral angiopathy without gangrene: Secondary | ICD-10-CM | POA: Diagnosis not present

## 2023-07-06 DIAGNOSIS — I513 Intracardiac thrombosis, not elsewhere classified: Secondary | ICD-10-CM | POA: Diagnosis not present

## 2023-07-06 DIAGNOSIS — Z7901 Long term (current) use of anticoagulants: Secondary | ICD-10-CM | POA: Insufficient documentation

## 2023-07-06 DIAGNOSIS — I2510 Atherosclerotic heart disease of native coronary artery without angina pectoris: Secondary | ICD-10-CM | POA: Diagnosis not present

## 2023-07-06 DIAGNOSIS — I5022 Chronic systolic (congestive) heart failure: Secondary | ICD-10-CM | POA: Diagnosis not present

## 2023-07-06 DIAGNOSIS — E119 Type 2 diabetes mellitus without complications: Secondary | ICD-10-CM | POA: Diagnosis present

## 2023-07-06 LAB — BASIC METABOLIC PANEL
Anion gap: 8 (ref 5–15)
BUN: 14 mg/dL (ref 6–20)
CO2: 27 mmol/L (ref 22–32)
Calcium: 9.4 mg/dL (ref 8.9–10.3)
Chloride: 108 mmol/L (ref 98–111)
Creatinine, Ser: 1.21 mg/dL (ref 0.61–1.24)
GFR, Estimated: >60 mL/min (ref >60)
Glucose, Bld: 131 mg/dL — ABNORMAL HIGH (ref 70–99)
Potassium: 4.1 mmol/L (ref 3.5–5.1)
Sodium: 143 mmol/L (ref 135–145)

## 2023-07-06 LAB — BRAIN NATRIURETIC PEPTIDE: B Natriuretic Peptide: 40.8 pg/mL (ref 0.0–100.0)

## 2023-07-06 MED ORDER — ATORVASTATIN CALCIUM 80 MG PO TABS: 80.0000 mg | ORAL_TABLET | Freq: Every day | ORAL | 3 refills | Status: AC

## 2023-07-06 MED ORDER — CARVEDILOL 6.25 MG PO TABS: 3.1250 mg | ORAL_TABLET | Freq: Two times a day (BID) | ORAL | 3 refills | Status: DC

## 2023-07-06 MED ORDER — ISOSORB DINITRATE-HYDRALAZINE 20-37.5 MG PO TABS: 1.0000 | ORAL_TABLET | Freq: Three times a day (TID) | ORAL | 3 refills | Status: AC

## 2023-07-06 MED ORDER — SACUBITRIL-VALSARTAN 97-103 MG PO TABS: 1.0000 | ORAL_TABLET | Freq: Two times a day (BID) | ORAL | 3 refills | Status: AC

## 2023-07-06 NOTE — Progress Notes (Signed)
 Advanced Heart Failure Clinic Note   PCP: Maudie Flakes, FNP Dr. Noah Delaine Primary Cardiologist: Dr Jens Som HF Cardiologist: Dr. Gala Romney     HPI:  Christopher Roth is a 84 YOM with HTN, T2DM, tobacco abuse, CAD, PAD and systolic CHF.    S/p BosSci ICD 01/2018.    Admitted 04/2016 with new onset HF. ECHO EF 25-30%, grade 2 DD. Diuresed 12 lbs. LHC recent occlusion of LAD.    cMRI 07/16/16 -> recalculated 08/19/16: EF 41% scar in LAD distribution. No viability    ECHO (06/2019): EF 30-35%   s/p RLE endovascular intervention and R TMA for osteomyelitis 02/2019. Followed by Dr. Randie Heinz.   ECHO (01/2021): EF 45-50%, mild LVH, grade I DD, RV mildly reduced.   Last seen in AHF clinic 01/2022. Echo 04/2022 EF 50-55%, mild LVH, G1DD, RV normal.   Admitted 05/2023 with left axillary artery thrombus with subsequent critical limb ischemia. Underwent LUE brachial artery thrombectomy, LUE forearm fasciotomy, carpal tunnel release, & hand fasciotomy. Required I&D and wound vac. Echo showed an LV thrombus, EF of 25-30%, RV not well seen but appeared reduced, akinetic apex with severe hypokinetic anteroseptum in the LAD territory. Cards consulted and AC started. Farxiga and carvedilol stopped, but Entresto, furosemide, and spironolactone continued. He was discharged home, weight 217 lbs.   Returned to [facility] for HF follow up 06/19/23. Overall was feeling fine. He had SOB walking further distances on flat ground. + bendopnea. Weight at home was 216 pounds.    Today he returns to HF clinic for pharmacist medication titration. At last visit with APP, furosemide 40 mg daily and KCL 20 mEq daily were restarted. Additionally, warfarin and aspirin were discontinued and Eliquis was initiated. Patient states he is feeling well overall. He denies SOB or DOE with normal activities. He denies in any lightheadedness, dizziness, or fatigue. He does not routinely check his blood pressures at home but BP is elevated in  clinic today at 168/92 mmHg. No chest pain or palpitations. He has no LEE. No PND/orthopnea. His appetite is OK. He reports taking all of his medications and using a pill box to remember doses. He states he has no issues with medication cost.     HF Medications: Entresto 97/103 mg BID Spironolactone 25 mg daily Furosemide 40 mg daily + KCL 20 mEq daily   Has the patient been experiencing any side effects to the medications prescribed?  no   Does the patient have any problems obtaining medications due to transportation or finances?   No; Chartered loss adjuster   Understanding of regimen: good Understanding of indications: good Potential of compliance: good Patient understands to avoid NSAIDs. Patient understands to avoid decongestants.     Pertinent Lab Values: 06/19/23: Serum creatinine 1.19 mg/dL, BUN 14 mg/dL, Potassium 4.1 mmol/L, Sodium 143 mmol/L, BNP 53.2 pg/mL 07/06/23: Serum creatinine 1.21 mg/dL, BUN 14 mg/dL, Potassium 4.1 mmol/L, Sodium 143 mmol/L, BNP 40.8 pg/mL   Vital Signs: Weight: 215 lbs (last clinic weight: 214 lbs) Blood pressure: 168/92 mmHg  Heart rate: 75 bpm    Assessment/Plan: 1. Chronic Combined Systolic and diastolic CHF -  ICM with Echo 16/1096 LVEF 25-30%, Grade 2 DD (EF 10-15% by cath) - Cath with chronic occlusion of LAD with no viability on MRI. Moderate non-obstructive CAD elsewhere - cMRI 07/16/16 >recalculated 08/2016 EF 41%. Anterior scar with no significant viability - S/p [employer] ICD 01/2018  - Echo 06/2019 EF 30-35% - Echo 01/2021 EF 45-50%, mild LVH, grade I  DD, RV mildly reduced. - Echo 04/2022 EF 50-55%, mild LVH, G1DD, RV normal - Echo 05/2023 at [facility] EF 25-30%, + thrombus - ? Drop in EF 2/2 to medication non compliance vs stress-mediated from acute illness. No chest pain. - NYHA I-II.  Volume status stable, BNP 40.8. BMET stable. - Continue furosemide 40 mg daily and KCL 20 mEq daily - Start carvedilol 3.125 mg BID - Continue  Entresto 97/103 mg BID  - Continue spironolactone 25 mg daily. - Hold off on SGLT2i with recent A1C of 12. Look to resume when DM better controlled - Start Bidil 20/37.5 mg 1 tablet TID - Plan to repeat Echo when GDMT optimized.    2. CAD via LHC 04/2016 - Chronic occlusion of LAD with no viability on MRI. Moderate non-obstructive CAD elsewhere - No chest pain. - Continue atorvastatin 80 mg daily - Holding ASA since on Eliquis   3. HTN - BP elevated today - Start carvedilol and Bidil as above   4. LV thrombus - Seen on echo 05/2023 at [facility]. - Coumadin was stopped and he was changed to Eliquis 06/19/23. Continue Eliquis.     5. DM II - A1C 05/16/23 was 12.0% - He is on insulin - Hold off on SGLT2i - Managed by PCP   6. Tobacco abuse - Now vaping. - Recommend complete cessation.   7. PAD - s/p PTA right anterior tibial and peroneal arteries followed by R foot TMA amputation 2020. - Followed by podiatry and vascular.   8. LUE thrombus - s/p LUE brachial artery thrombectomy, LUE forearm fasciotomy, - s/p I&D, delayed closure and L forearm skin graft - Followed by Plastics and Vascular   9. OSA - PSG (10/2019) AHI 5.3. - Has CPAP but unable to tolerate mask - Refer back to Dr. Mayford Knife to discuss options next visit   Follow up in HF clinic on 07/31/23 with APP   Wilmer Floor, PharmD PGY2 Cardiology Pharmacy Resident  Karle Plumber, PharmD, BCPS, [facility], CPP Heart Failure Clinic Pharmacist [phone]

## 2023-07-06 NOTE — Patient Instructions (Signed)
 It was a pleasure seeing you today!  MEDICATIONS: -We are changing your medications today -Start carvedilol 3.125 mg twice daily. -Start Bidil 20/37.5 mg (one tablet) three times daily. -Call if you have questions about your medications.  LABS: -We will call you if your labs need attention.  NEXT APPOINTMENT: Return to clinic on 07/31/23 with HF APP.  In general, to take care of your heart failure: -Limit your fluid intake to 2 Liters (half-gallon) per day.   -Limit your salt intake to ideally 2-3 grams (2000-3000 mg) per day. -Weigh yourself daily and record, and bring that "weight diary" to your next appointment.  (Weight gain of 2-3 pounds in 1 day typically means fluid weight.) -The medications for your heart are to help your heart and help you live longer.   -Please contact us before stopping any of your heart medications.  Call the clinic at [phone] with questions or to reschedule future appointments.

## 2023-07-07 ENCOUNTER — Inpatient Hospital Stay (HOSPITAL_COMMUNITY)
Admission: RE | Admit: 2023-07-07 | Discharge: 2023-07-07 | Disposition: A | Discharge status: Home / Self Care | Payer: 59 | Source: Ambulatory Visit

## 2023-07-07 VITALS — BP 167/92 | HR 75 | Wt 226.7 lb

## 2023-07-07 DIAGNOSIS — I5022 Chronic systolic (congestive) heart failure: Secondary | ICD-10-CM

## 2023-07-09 ENCOUNTER — Other Ambulatory Visit (HOSPITAL_COMMUNITY): Payer: Self-pay

## 2023-07-17 NOTE — Progress Notes (Signed)
 Remote ICD transmission.

## 2023-07-20 ENCOUNTER — Ambulatory Visit: Payer: 59

## 2023-07-29 NOTE — Progress Notes (Signed)
 Advanced Heart Failure Clinic Note   PCP: Maudie Flakes, FNP Dr. Noah Delaine Primary Cardiologist: Dr Jens Som HF Cardiologist: Dr. Gala Romney  HPI: Christopher Roth is a 53 y.o. male with HTN, DM2, Tobacco abuse, CAD, PAD and systolic CHF.   S/P [employer] ICD 01/2018.   Admitted 12/17 with new onest HF. Echo EF 25-30%, Grade 2 DD. Diuresed 12 lbs. LHC recent occlusion of LAD.   cMRI 07/16/16 -> recalculated 08/19/16: EF 41% scar in LAD distribution. No viability   Echo (2/21): EF 30-35%  s/p RLE endovascular intervention and R TMA for osteomyelitis 02/2019. Followed by Dr. Randie Heinz.  Echo (9/22): EF 45-50%, mild LVH, grade I DD, RV mildly reduced.  Last seen in AHF clinic 01/2022. Echo 12/23 EF 50-55%, mild LVH, G1DD, RV normal.  Admitted 1/25 with left axillary artery thrombus => critical limb ischemia. Underwent LUE brachial artery thrombectomy, LUE forearm fasciotomy, carpal tunnel release, & hand fasciotomy. Required I&D and wound vac. Echo showed an LV thrombus, EF of 25-30%, RV not well seen but appeared reduced, akinetic apex with severe hypokinetic anteroseptum in the LAD territory. Cards consulted and AC started. Farxiga stopped, but Entresto, Lasix and spiro continued. He was discharged home, weight 217 lbs.  Today he returns for HF follow up. We have not seen him since 01/2022. Overall feeling fine. He has SOB walking further distances on flat ground. + bendopnea. Right leg swells. He rare atypical chest pain, he attributes this is indigestion. Resolves spontaneously. Denies palpitations, abnormal bleeding, dizziness, or PND/Orthopnea. Appetite ok. No fever or chills. Weight at home 216 pounds. He has been off of Lasix, Coreg, and hydralazine. Unable to tolerate CPAP, vapes daily. He works full time at [employer], physical work. Planning to start PT on arm soon and going back to work on light duty next week.   Cardiac studies: - Echo (1/25 at [facility]): EF 25-30%, + thrombus  -  Echo (12/23): EF 50-55%, mild LVH, G1DD, RV normal.  - Echo (9/22): EF 45-50%, mild LVH, grade I DD, RV mildly reduced  - Echo (2/21): EF 30-35%  - cMRI (07/16/16) -> recalculated 08/19/16: EF 41% scar in LAD distribution. No viability   - LHC (04/23/16) - Recent total occlusion of the proximal to mid LAD.  - 70% ramus intermedius, 65% ostial circumflex followed by 70% mid circumflex, 75% proximal LAD prior to the origin of a small to moderate size diagonal, and 90% mid PDA. 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     Alcohol Swabs PADS 1 Package by Does not apply route as needed (with blood glucose checks). 1 each 0   apixaban (ELIQUIS) 5 MG  TABS tablet Take 1 tablet (5 mg total) by mouth 2 (two) times daily. Start 06/21/2023 60 tablet 11   atorvastatin (LIPITOR) 80 MG tablet Take 1 tablet (80 mg total) by mouth daily. 90 tablet 3   carvedilol (COREG) 6.25 MG tablet Take 0.5 tablets (3.125 mg total) by mouth 2 (two) times daily. 90 tablet 3   furosemide (LASIX) 40 MG tablet Take 1 tablet (40 mg total) by mouth daily. 90 tablet 3   glucose blood (CHOICE DM FORA G20 TEST STRIPS) test strip Use as  instructed 100 each 11   glucose monitoring kit (FREESTYLE) monitoring kit 1 each by Does not apply route as needed for other. 1 each 0   isosorbide-hydrALAZINE (BIDIL) 20-37.5 MG tablet Take 1 tablet by mouth 3 (three) times daily. 270 tablet 3   Lancets (FREESTYLE) lancets Use as instructed 100 each 11   potassium chloride SA (KLOR-CON M) 20 MEQ tablet Take 1 tablet (20 mEq total) by mouth daily. 90 tablet 3   sacubitril-valsartan (ENTRESTO) 97-103 MG Take 1 tablet by mouth 2 (two) times daily. 180 tablet 3   spironolactone (ALDACTONE) 25 MG tablet Take 1 tablet (25 mg total) by mouth daily. Needs appt for further refills 90 tablet 0   No current facility-administered medications for this visit.    Allergies  Allergen Reactions   Amoxicillin Rash   Nickel Rash     Social History   Socioeconomic History   Marital status: Married    Spouse name: Not on file   Number of children: Not on file   Years of education: Not on file   Highest education level: Not on file  Occupational History   Not on file  Tobacco Use   Smoking status: Some Days    Current packs/day: 0.00    Average packs/day: 1 pack/day for 24.0 years (24.0 ttl pk-yrs)    Types: Cigars, Cigarettes    Start date: 03/27/1992    Last attempt to quit: 03/27/2016    Years since quitting: 7.3   Smokeless tobacco: Never   Tobacco comments:    h/o cigarettes with ongoing occasional cigar use  Vaping Use   Vaping status: Never Used  Substance and Sexual Activity   Alcohol use: Yes    Comment: occ   Drug use: No   Sexual activity: Not on file  Other Topics Concern   Not on file  Social History Narrative   Not on file   Social Drivers of Health   Financial Resource Strain: Low Risk  (07/29/2023)   Received [facility]   Overall Financial Resource Strain (CARDIA)    Difficulty of Paying Living Expenses: Not hard at all  Food Insecurity: No Food Insecurity (07/29/2023)   Received from [facility]   Hunger  Vital Sign    Worried About Running Out of Food in the Last Year: Never true    Ran Out of Food in the Last Year: Never true  Transportation Needs: No Transportation Needs (07/29/2023)   Received from [facility] - Transportation    Lack of Transportation (Medical): No    Lack of Transportation (Non-Medical): No  Physical Activity: Insufficiently Active (07/29/2023)   Received from [facility]   Exercise Vital Sign    Days of Exercise per Week: 3 days    Minutes of Exercise per Session: 20 min  Stress: No Stress Concern Present (07/29/2023)   Received from [facility] -  Occupational Stress Questionnaire    Feeling of Stress : Not at all  Social Connections: Somewhat Isolated (07/29/2023)   Received from [facility]   Social Network    How would you rate your social network (family, work, friends)?: Restricted participation with some degree of social isolation  Intimate Partner Violence: Not At Risk (07/29/2023)   Received from [facility]   HITS    Over the last 12 months how often did your partner physically hurt you?: Never    Over the last 12 months how often did your partner insult you or talk down to you?: Never    Over the last 12 months how often did your partner threaten you with physical harm?: Never    Over the last 12 months how often did your partner scream or curse at you?: Never   Family History  Problem Relation Age of Onset   Diabetes Mellitus II Mother    There were no vitals taken for this visit.  Wt Readings from Last 3 Encounters:  07/06/23 102.8 kg (226 lb 11.2 oz)  07/06/23 97.5 kg (215 lb)  06/19/23 100.7 kg (222 lb)   PHYSICAL EXAM: General:  NAD. No resp difficulty, walked into clinic HEENT: Normal Neck: Supple. JVP 10 Cor: Regular rate & rhythm. No rubs, gallops or murmurs. Lungs: Clear Abdomen: Soft, nontender, nondistended.  Extremities: No cyanosis, clubbing, rash, 1+ LUE edema, L arm  in sling Neuro: Alert & oriented x 3, moves all 4 extremities w/o difficulty. Affect pleasant.  ICD interrogation (personally reviewed from 06/10/23): HL score 20, 1.2 hr/day activity, impedence down, average HR 57 bpm, 8 NSVT episodes  ECG (personally reviewed):  NSR, rBBB 82 bpm  ReDs reading: 42%, abnormal  ASSESSMENT & PLAN: 1. Chronic Combined Systolic and diastolic CHF -  ICM with Echo 65/7846 LVEF 25-30%, Grade 2 DD (EF 10-15% by cath) - Cath with chronic occlusion of LAD with no viability on MRI. Moderate non-obstructive CAD elsewhere - cMRI 07/16/16 -> recalculated 08/2016 EF 41%. Anterior scar with no significant viability - S/p [employer] ICD 01/2018  - Echo 2/21 EF 30-35% - Echo (9/22): EF 45-50%, mild LVH, grade I DD, RV mildly reduced. - Echo (12/23): EF 50-55%, mild LVH, G1DD, RV normal. - Echo (1/25 at [facility]): EF 25-30%, + clot - ? Drop in EF 2/2 to medication non compliance vs stress-mediated from acute illness. No chest pain. - NYHA II-IIb. Volume up today on exam and device interrogation from last week, REDs elevated at 42% - Restart Lasix 40 mg daily, add 20 KCL daily. - Continue Entresto 97/103 mg bid.  - Continue spiro 25 mg daily. - Hold off on SGLT2i with recent A1C of 12. - Restart Coreg when volume improved. - Labs today, repeat BMET at follow up - Plan to repeat echo when GDMT optimized.   2. CAD via LHC 04/2016 - Chronic occlusion of LAD with no viability on MRI. Moderate non-obstructive CAD elsewhere - No chest pain. - Continue statin. - Stop ASA with addition of AC  3. HTN - BP stable today. - Meds as above  4. LV thrombus - Seen on echo 1/25 at [facility]. - On Coumadin, INR 2.7 yesterday (Goal 2-3) - We discussed DOAC. He is agreeable. Stop ASA as above. - Stop warfarin, in 2 days (06/21/23), start Eliquis 5 mg bid. (90-day supply, co-pay card given) - Personally discussed with PharmD - Check CBC today.   5. DM II - A1C 05/16/23 was 12 -  He is on insulin - Hold off on SGLT2i - Managed by PCP  6. Tobacco abuse - Now vaping. - Recommend complete cessation.  7. PAD - s/p PTA right anterior tibial and peroneal arteries followed by R foot TMA amputation 2020. - Followed by podiatry and vascular.  8. LUE thrombus - s/p LUE brachial artery thrombectomy, LUE forearm fasciotomy, - s/p I&D, delayed closure and L forearm skin graft - Followed by Plastics and Vascular  9. OSA - PSG (6/21) AHI 5.3. - Has CPAP but unable to tolerate mask - Refer back to Dr. Mayford Knife to discuss options next visit  Follow up in 3 weeks with PharmD (will need a BMET, GDMT titration) and 6 weeks with APP.  Jacklynn Ganong, FNP  4:29 PM

## 2023-07-30 ENCOUNTER — Telehealth (HOSPITAL_COMMUNITY): Payer: Self-pay

## 2023-07-30 NOTE — Telephone Encounter (Signed)
 Called to confirm/remind patient of their appointment at the Advanced Heart Failure Clinic on 07/31/23***.   Appointment:   [x] Confirmed  [] Left mess   [] No answer/No voice mail  [] Phone not in service  Patient reminded to bring all medications and/or complete list.  Confirmed patient has transportation. Gave directions, instructed to utilize valet parking.

## 2023-07-31 ENCOUNTER — Ambulatory Visit (HOSPITAL_COMMUNITY)
Admission: RE | Admit: 2023-07-31 | Discharge: 2023-07-31 | Disposition: A | Discharge status: Home / Self Care | Payer: 59 | Source: Ambulatory Visit | Attending: Family Medicine | Admitting: Family Medicine

## 2023-07-31 ENCOUNTER — Encounter (HOSPITAL_COMMUNITY): Payer: Self-pay

## 2023-07-31 VITALS — BP 142/82 | HR 86 | Ht 68.0 in | Wt 229.0 lb

## 2023-07-31 DIAGNOSIS — F17290 Nicotine dependence, other tobacco product, uncomplicated: Secondary | ICD-10-CM | POA: Insufficient documentation

## 2023-07-31 DIAGNOSIS — I25118 Atherosclerotic heart disease of native coronary artery with other forms of angina pectoris: Secondary | ICD-10-CM

## 2023-07-31 DIAGNOSIS — I2510 Atherosclerotic heart disease of native coronary artery without angina pectoris: Secondary | ICD-10-CM | POA: Diagnosis not present

## 2023-07-31 DIAGNOSIS — Z7901 Long term (current) use of anticoagulants: Secondary | ICD-10-CM | POA: Diagnosis not present

## 2023-07-31 DIAGNOSIS — I5042 Chronic combined systolic (congestive) and diastolic (congestive) heart failure: Secondary | ICD-10-CM | POA: Diagnosis not present

## 2023-07-31 DIAGNOSIS — Z794 Long term (current) use of insulin: Secondary | ICD-10-CM | POA: Insufficient documentation

## 2023-07-31 DIAGNOSIS — Z79899 Other long term (current) drug therapy: Secondary | ICD-10-CM | POA: Insufficient documentation

## 2023-07-31 DIAGNOSIS — I513 Intracardiac thrombosis, not elsewhere classified: Secondary | ICD-10-CM | POA: Insufficient documentation

## 2023-07-31 DIAGNOSIS — Z95810 Presence of automatic (implantable) cardiac defibrillator: Secondary | ICD-10-CM | POA: Diagnosis not present

## 2023-07-31 DIAGNOSIS — I5022 Chronic systolic (congestive) heart failure: Secondary | ICD-10-CM | POA: Diagnosis not present

## 2023-07-31 DIAGNOSIS — E1159 Type 2 diabetes mellitus with other circulatory complications: Secondary | ICD-10-CM

## 2023-07-31 DIAGNOSIS — G4733 Obstructive sleep apnea (adult) (pediatric): Secondary | ICD-10-CM | POA: Insufficient documentation

## 2023-07-31 DIAGNOSIS — I110 Hypertensive heart disease with heart failure: Secondary | ICD-10-CM | POA: Insufficient documentation

## 2023-07-31 DIAGNOSIS — Z720 Tobacco use: Secondary | ICD-10-CM

## 2023-07-31 DIAGNOSIS — I10 Essential (primary) hypertension: Secondary | ICD-10-CM | POA: Diagnosis not present

## 2023-07-31 DIAGNOSIS — Z833 Family history of diabetes mellitus: Secondary | ICD-10-CM | POA: Diagnosis not present

## 2023-07-31 DIAGNOSIS — I742 Embolism and thrombosis of arteries of the upper extremities: Secondary | ICD-10-CM

## 2023-07-31 DIAGNOSIS — E1151 Type 2 diabetes mellitus with diabetic peripheral angiopathy without gangrene: Secondary | ICD-10-CM | POA: Diagnosis not present

## 2023-07-31 DIAGNOSIS — I739 Peripheral vascular disease, unspecified: Secondary | ICD-10-CM

## 2023-07-31 LAB — BASIC METABOLIC PANEL WITH GFR
Anion gap: 8 (ref 5–15)
BUN: 21 mg/dL — ABNORMAL HIGH (ref 6–20)
CO2: 25 mmol/L (ref 22–32)
Calcium: 9.4 mg/dL (ref 8.9–10.3)
Chloride: 105 mmol/L (ref 98–111)
Creatinine, Ser: 1.23 mg/dL (ref 0.61–1.24)
GFR, Estimated: >60 mL/min (ref >60)
Glucose, Bld: 237 mg/dL — ABNORMAL HIGH (ref 70–99)
Potassium: 5.3 mmol/L — ABNORMAL HIGH (ref 3.5–5.1)
Sodium: 138 mmol/L (ref 135–145)

## 2023-07-31 LAB — BRAIN NATRIURETIC PEPTIDE: B Natriuretic Peptide: 66 pg/mL (ref 0.0–100.0)

## 2023-07-31 MED ORDER — CARVEDILOL 6.25 MG PO TABS: 6.2500 mg | ORAL_TABLET | Freq: Two times a day (BID) | ORAL | 3 refills | Status: DC

## 2023-07-31 MED ORDER — SPIRONOLACTONE 25 MG PO TABS: 25.0000 mg | ORAL_TABLET | Freq: Every day | ORAL | 3 refills | Status: AC

## 2023-07-31 NOTE — Patient Instructions (Signed)
 Great to see you today!!!  Medication Changes:  INCREASE Carvedilol to 6.25 mg Twice daily   Lab Work:  Labs done today, your results will be available in MyChart, we will contact you for abnormal readings.   Testing/Procedures:  Your physician has requested that you have an echocardiogram. Echocardiography is a painless test that uses sound waves to create images of your heart. It provides your doctor with information about the size and shape of your heart and how well your heart's chambers and valves are working. This procedure takes approximately one hour. There are no restrictions for this procedure. Please do NOT wear cologne, perfume, aftershave, or lotions (deodorant is allowed). Please arrive 15 minutes prior to your appointment time. IN 3 MONTHS  Please note: We ask at that you not bring children with you during ultrasound (echo/ vascular) testing. Due to room size and safety concerns, children are not allowed in the ultrasound rooms during exams. Our front office staff cannot provide observation of children in our lobby area while testing is being conducted. An adult accompanying a patient to their appointment will only be allowed in the ultrasound room at the discretion of the ultrasound technician under special circumstances. We apologize for any inconvenience.   Referrals:  You have been referred to follow-up with Dr Mayford Knife for CPAP  Special Instructions // Education:  Do the following things EVERYDAY: Weigh yourself in the morning before breakfast. Write it down and keep it in a log. Take your medicines as prescribed Eat low salt foods--Limit salt (sodium) to 2000 mg per day.  Stay as active as you can everyday Limit all fluids for the day to less than 2 liters   Follow-Up in: 2-3 months with an echocardiogram   At the [facility], you and your health needs are our priority. We have a designated team specialized in the treatment of Heart Failure.  This Care Team includes your primary Heart Failure Specialized Cardiologist (physician), Advanced Practice Providers (APPs- Physician Assistants and Nurse Practitioners), and Pharmacist who all work together to provide you with the care you need, when you need it.   You may see any of the following providers on your designated Care Team at your next follow up:  Dr. Arvilla Meres Dr. Marca Ancona Dr. Dorthula Nettles Dr. Theresia Bough Tonye Becket, NP Robbie Lis, [place] [facility] [place], [place] Brynda Peon, NP [place] Lee, NP Karle Plumber, PharmD   Please be sure to bring in all your medications bottles to every appointment.   Need to Contact us:  If you have any questions or concerns before your next appointment please send [place] a message through [place] or call our office at [phone].    TO LEAVE A MESSAGE FOR THE NURSE SELECT OPTION 2, PLEASE LEAVE A MESSAGE INCLUDING: YOUR NAME DATE OF BIRTH CALL BACK NUMBER REASON FOR CALL**this is important as we prioritize the call backs  YOU WILL RECEIVE A CALL BACK THE SAME DAY AS LONG AS YOU CALL BEFORE 4:00 PM

## 2023-07-31 NOTE — Progress Notes (Signed)
 ReDS Vest / Clip - 07/31/23 1500       ReDS Vest / Clip   Station Marker C    Ruler Value 33    ReDS Value Range High volume overload    ReDS Actual Value 41

## 2023-08-06 ENCOUNTER — Encounter: Payer: Self-pay | Admitting: Podiatry

## 2023-08-06 ENCOUNTER — Ambulatory Visit (INDEPENDENT_AMBULATORY_CARE_PROVIDER_SITE_OTHER): Admitting: Podiatry

## 2023-08-06 DIAGNOSIS — E11621 Type 2 diabetes mellitus with foot ulcer: Secondary | ICD-10-CM

## 2023-08-06 DIAGNOSIS — L97422 Non-pressure chronic ulcer of left heel and midfoot with fat layer exposed: Secondary | ICD-10-CM

## 2023-08-06 DIAGNOSIS — E1149 Type 2 diabetes mellitus with other diabetic neurological complication: Secondary | ICD-10-CM

## 2023-08-06 DIAGNOSIS — Z89431 Acquired absence of right foot: Secondary | ICD-10-CM

## 2023-08-06 DIAGNOSIS — E119 Type 2 diabetes mellitus without complications: Secondary | ICD-10-CM | POA: Diagnosis not present

## 2023-08-09 NOTE — Progress Notes (Signed)
 Subjective:  Patient ID: Christopher Roth, male    DOB: Aug 19, 1970,  MRN: [id number]  Chief Complaint  Patient presents with   Foot Pain    Rm#13 Patient is in need of diabetic shoe and inserts.    Discussed the use of AI scribe software for clinical note transcription with the patient, who gave verbal consent to proceed.  History of Present Illness The patient, with a history of diabetes, presents for diabetic foot care. He has been self-treating a recent foot wound at home due to work commitments. The wound was initially oozing and had a smell, but is now healing. He has been using an over-the-counter cream, the name of which he cannot recall, and has been trying to stay off the foot as much as possible. He also has a callus on the right foot, which he has been managing with a felt pad provided during his last visit.  The patient also reports that he has been placed on insulin for his diabetes, but his blood sugar control has been suboptimal with a recent HbA1c of 11. He is due for a follow-up with his primary care provider in May.  In addition to his diabetes, the patient has been dealing with a blood clot in his left arm, which required surgery and has resulted in muscle loss. He is currently undergoing physical therapy for this issue.    Objective:    Physical Exam General: AAO x3, NAD  Dermatological: On the plantar aspect of the left foot is a hyperkeratotic lesion with underlying ulceration as pictured below measuring 0.4 x 0.4 x 0.1 cm with a granular base.  There is no surrounding erythema, ascending cellulitis.  There is no fluctuance or crepitation was noted.  On the right foot hyperkeratotic lesion noted without any underlying ulceration, drainage or signs of infection.  Vascular: Dorsalis Pedis artery and Posterior Tibial artery pedal pulses are palpable bilateral with immedate capillary fill time.  There is no pain with calf compression, swelling, warmth, erythema.    Neruologic: Grossly intact via light touch bilateral.   Musculoskeletal: Right transmetatarsal amputation noted.  Gait: Unassisted, Nonantalgic.         Results    Assessment:   1. History of transmetatarsal amputation of right foot (HCC)   2. Ulcerated, foot, left, with fat layer exposed (HCC)   3. Dermatophytosis of nail   4. Pain in toes of both feet      Plan:  Patient was evaluated and treated and all questions answered.  Assessment and Plan Assessment & Plan Diabetic foot ulcer Healing diabetic foot ulcer on right foot, previously oozing and malodorous, now improving. Callus trimmed to promote healing. Glycemic control suboptimal with A1c of 11, affecting wound healing. -Medically necessary wound debridements performed today.  Sharply debrided the hyperkeratotic lesion revealed underlying ulceration with a #312 with scalpel.  I debrided nonviable, devitalized tissue in order promote wound healing.  Pre and post wound measurements are noted above but I was not able to measure the wound prior to debridement.  There is blood loss.  I cleaned the area with saline.  Betadine applied followed by dressing. - Apply Betadine to ulcer and cover with a bandage. - Measure for diabetic shoes and inserts-will follow-up with Bethann Berkshire, pedorthist for this. - Monitor for infection signs: swelling, redness, drainage, fever, chills. - Monitor for any clinical signs or symptoms of infection and directed to call the office immediately should any occur or go to the ER.  Transmetatarsal amputation (right  foot) Surgical site well-healed with callus formation, managed with moisturizing and trimming. Diabetic shoes and inserts to offload pressure and prevent complications. - Moisturize amputation site to prevent callus formation. - Trim calluses as needed. - Provide diabetic shoes and inserts.  Diabetes mellitus with poor glycemic control Diabetes managed with insulin, poor glycemic control  with A1c of 11, affecting wound healing and health. Scheduled follow-up with primary care provider in early May. - Encourage follow-up with primary care provider or endocrinologist. - Monitor blood glucose levels regularly   Return in about 3 weeks (around 08/27/2023) for left foot ulcer.  X-ray if the wound is still present  Vivi Barrack DPM

## 2023-08-13 ENCOUNTER — Other Ambulatory Visit (HOSPITAL_COMMUNITY)

## 2023-08-27 ENCOUNTER — Ambulatory Visit: Payer: 59

## 2023-09-01 ENCOUNTER — Ambulatory Visit (INDEPENDENT_AMBULATORY_CARE_PROVIDER_SITE_OTHER): Admitting: Podiatry

## 2023-09-01 ENCOUNTER — Ambulatory Visit (INDEPENDENT_AMBULATORY_CARE_PROVIDER_SITE_OTHER)

## 2023-09-01 DIAGNOSIS — E1149 Type 2 diabetes mellitus with other diabetic neurological complication: Secondary | ICD-10-CM

## 2023-09-01 DIAGNOSIS — E11621 Type 2 diabetes mellitus with foot ulcer: Secondary | ICD-10-CM

## 2023-09-01 DIAGNOSIS — Z89431 Acquired absence of right foot: Secondary | ICD-10-CM | POA: Diagnosis not present

## 2023-09-01 DIAGNOSIS — L97422 Non-pressure chronic ulcer of left heel and midfoot with fat layer exposed: Secondary | ICD-10-CM

## 2023-09-01 NOTE — Progress Notes (Unsigned)
  Subjective:  Patient ID: Christopher Roth, male    DOB: 11/17/1970,  MRN: [id number]  No chief complaint on file.    History of Present Illness The patient, with a history of diabetes, presents for follow-up evaluation of a wound on the bottom of his foot.  He states he has been doing well.  Denies any drainage or pus or increasing swelling or redness.  Does not report any fevers or chills.  He has been doing well.  No other concerns.  He has not been keeping a bandage on.    Objective:    Physical Exam General: AAO x3, NAD  Dermatological: On the plantar aspect of the left foot is a hyperkeratotic lesion.  Upon debridement ulceration is healed.  There is no surrounding erythema, ascending cellulitis.  No fluctuation or crepitation and there is no malodor.  Vascular: Dorsalis Pedis artery and Posterior Tibial artery pedal pulses are palpable bilateral with immedate capillary fill time.  There is no pain with calf compression, swelling, warmth, erythema.   Neruologic: Grossly intact via light touch bilateral.   Musculoskeletal: Right transmetatarsal amputation noted.  Gait: Unassisted, Nonantalgic.      Assessment:   Ulceration left foot with improvement  Plan:  Patient was evaluated and treated and all questions answered.  Assessment and Plan Assessment & Plan Diabetic foot ulcer -X-rays obtained reviewed.  Multiple views obtained.  There is no definitive cortical changes suggest osteomyelitis.  There is no soft tissue emphysema. -Sharply debrided the hyperkeratotic tissue without any complications or bleeding today.  Appears the wound is healed.  I recommended moisturizer, offloading.  I do think he will benefit from new diabetic shoes, insoles.  Order was placed for this and we will fax this to [facility]. -Discussed daily foot inspection with glucose control.  Charity Conch DPM

## 2023-09-09 ENCOUNTER — Encounter: Payer: Self-pay | Admitting: Podiatry

## 2023-09-09 ENCOUNTER — Ambulatory Visit (INDEPENDENT_AMBULATORY_CARE_PROVIDER_SITE_OTHER): Payer: 59

## 2023-09-09 DIAGNOSIS — I255 Ischemic cardiomyopathy: Secondary | ICD-10-CM | POA: Diagnosis not present

## 2023-09-09 NOTE — Telephone Encounter (Signed)
 Christopher Roth, can you call to see if they got the order? Thanks!

## 2023-09-10 LAB — CUP PACEART REMOTE DEVICE CHECK
Battery Remaining Longevity: 126 mo
Battery Remaining Percentage: 87 %
Brady Statistic RV Percent Paced: 0 %
Date Time Interrogation Session: 20250507200200
HighPow Impedance: 68 Ohm
Implantable Lead Connection Status: 753985
Implantable Lead Implant Date: 20190925
Implantable Lead Location: 753860
Implantable Lead Model: 293
Implantable Lead Serial Number: 441633
Implantable Pulse Generator Implant Date: 20190925
Lead Channel Impedance Value: 544 Ohm
Lead Channel Setting Pacing Amplitude: 2.5 V
Lead Channel Setting Pacing Pulse Width: 0.4 ms
Lead Channel Setting Sensing Sensitivity: 0.5 mV
Pulse Gen Serial Number: 244469

## 2023-09-15 ENCOUNTER — Other Ambulatory Visit (HOSPITAL_COMMUNITY): Payer: Self-pay | Admitting: Cardiology

## 2023-09-15 MED ORDER — CARVEDILOL 6.25 MG PO TABS: 6.2500 mg | ORAL_TABLET | Freq: Two times a day (BID) | ORAL | 3 refills | Status: AC

## 2023-09-29 ENCOUNTER — Ambulatory Visit

## 2023-09-29 DIAGNOSIS — E1149 Type 2 diabetes mellitus with other diabetic neurological complication: Secondary | ICD-10-CM

## 2023-09-29 DIAGNOSIS — Z89431 Acquired absence of right foot: Secondary | ICD-10-CM

## 2023-09-29 NOTE — Progress Notes (Signed)
 Patient presents to the office today for diabetic shoe and insole measuring.  Patient was measured with brannock device to determine size and width for 1 pair of extra depth shoes and scanned for RT Toefiller and left inserts 3ea   Documentation to be sent to [employer] for approval  Shoes and insoles will be ordered at that time and patient will be notified for an appointment for fitting when they arrive.  Shoe size (per patient): 11.5  Shoe choice:   681 black  Shoe size ordered: 11.5 WD and will order XWD as well to try both    Prior auth request sent

## 2023-09-30 ENCOUNTER — Ambulatory Visit: Payer: Self-pay | Admitting: Cardiology

## 2023-10-11 ENCOUNTER — Other Ambulatory Visit (HOSPITAL_COMMUNITY): Payer: Self-pay | Admitting: Family Medicine

## 2023-10-19 ENCOUNTER — Ambulatory Visit: Payer: 59

## 2023-10-19 NOTE — Addendum Note (Signed)
 Addended by: Lott Rouleau A on: 10/19/2023 03:35 PM   Modules accepted: Orders

## 2023-10-19 NOTE — Progress Notes (Signed)
 Remote ICD transmission.

## 2023-10-28 ENCOUNTER — Ambulatory Visit (INDEPENDENT_AMBULATORY_CARE_PROVIDER_SITE_OTHER)

## 2023-10-28 ENCOUNTER — Encounter (HOSPITAL_COMMUNITY): Payer: Self-pay | Admitting: Internal Medicine

## 2023-10-28 ENCOUNTER — Ambulatory Visit (HOSPITAL_COMMUNITY)
Admission: RE | Admit: 2023-10-28 | Discharge: 2023-10-28 | Disposition: A | Discharge status: Home / Self Care | Source: Ambulatory Visit | Attending: Family Medicine | Admitting: Family Medicine

## 2023-10-28 ENCOUNTER — Ambulatory Visit (HOSPITAL_BASED_OUTPATIENT_CLINIC_OR_DEPARTMENT_OTHER)
Admission: RE | Admit: 2023-10-28 | Discharge: 2023-10-28 | Disposition: A | Discharge status: Home / Self Care | Source: Ambulatory Visit | Attending: Internal Medicine | Admitting: Internal Medicine

## 2023-10-28 VITALS — BP 120/88 | HR 67 | Wt 241.6 lb

## 2023-10-28 DIAGNOSIS — I5022 Chronic systolic (congestive) heart failure: Secondary | ICD-10-CM

## 2023-10-28 DIAGNOSIS — I358 Other nonrheumatic aortic valve disorders: Secondary | ICD-10-CM | POA: Insufficient documentation

## 2023-10-28 DIAGNOSIS — E1149 Type 2 diabetes mellitus with other diabetic neurological complication: Secondary | ICD-10-CM

## 2023-10-28 DIAGNOSIS — I110 Hypertensive heart disease with heart failure: Secondary | ICD-10-CM | POA: Diagnosis not present

## 2023-10-28 DIAGNOSIS — I739 Peripheral vascular disease, unspecified: Secondary | ICD-10-CM | POA: Diagnosis not present

## 2023-10-28 DIAGNOSIS — I10 Essential (primary) hypertension: Secondary | ICD-10-CM | POA: Diagnosis not present

## 2023-10-28 DIAGNOSIS — Z89431 Acquired absence of right foot: Secondary | ICD-10-CM | POA: Diagnosis not present

## 2023-10-28 DIAGNOSIS — Z95810 Presence of automatic (implantable) cardiac defibrillator: Secondary | ICD-10-CM | POA: Diagnosis not present

## 2023-10-28 DIAGNOSIS — Z79899 Other long term (current) drug therapy: Secondary | ICD-10-CM | POA: Diagnosis not present

## 2023-10-28 DIAGNOSIS — G4733 Obstructive sleep apnea (adult) (pediatric): Secondary | ICD-10-CM | POA: Diagnosis not present

## 2023-10-28 DIAGNOSIS — L97422 Non-pressure chronic ulcer of left heel and midfoot with fat layer exposed: Secondary | ICD-10-CM

## 2023-10-28 DIAGNOSIS — Z7901 Long term (current) use of anticoagulants: Secondary | ICD-10-CM | POA: Diagnosis not present

## 2023-10-28 DIAGNOSIS — Z720 Tobacco use: Secondary | ICD-10-CM

## 2023-10-28 DIAGNOSIS — I2510 Atherosclerotic heart disease of native coronary artery without angina pectoris: Secondary | ICD-10-CM | POA: Insufficient documentation

## 2023-10-28 DIAGNOSIS — M2141 Flat foot [pes planus] (acquired), right foot: Secondary | ICD-10-CM | POA: Diagnosis not present

## 2023-10-28 DIAGNOSIS — E1151 Type 2 diabetes mellitus with diabetic peripheral angiopathy without gangrene: Secondary | ICD-10-CM | POA: Insufficient documentation

## 2023-10-28 DIAGNOSIS — Z794 Long term (current) use of insulin: Secondary | ICD-10-CM | POA: Insufficient documentation

## 2023-10-28 DIAGNOSIS — Z7984 Long term (current) use of oral hypoglycemic drugs: Secondary | ICD-10-CM | POA: Insufficient documentation

## 2023-10-28 DIAGNOSIS — M2142 Flat foot [pes planus] (acquired), left foot: Secondary | ICD-10-CM

## 2023-10-28 DIAGNOSIS — F17290 Nicotine dependence, other tobacco product, uncomplicated: Secondary | ICD-10-CM | POA: Diagnosis not present

## 2023-10-28 DIAGNOSIS — E11621 Type 2 diabetes mellitus with foot ulcer: Secondary | ICD-10-CM

## 2023-10-28 LAB — ECHOCARDIOGRAM COMPLETE
AR max vel: 3.21 cm2
AV Area VTI: 2.62 cm2
AV Area mean vel: 2.28 cm2
AV Mean grad: 5 mmHg
AV Peak grad: 8.6 mmHg
Ao pk vel: 1.47 m/s
Area-P 1/2: 2.93 cm2
Calc EF: 42.6 %
S' Lateral: 4.2 cm
Single Plane A2C EF: 45.9 %
Single Plane A4C EF: 35.9 %

## 2023-10-28 MED ORDER — FUROSEMIDE 40 MG PO TABS
40.0000 mg | ORAL_TABLET | Freq: Every day | ORAL | Status: AC | PRN
Start: 2023-10-28 — End: 2024-01-26

## 2023-10-28 MED ORDER — PERFLUTREN LIPID MICROSPHERE
1.0000 mL | INTRAVENOUS | Status: DC | PRN
  Administered 2023-10-28: 2 mL via INTRAVENOUS

## 2023-10-28 MED ORDER — EMPAGLIFLOZIN 10 MG PO TABS: 10.0000 mg | ORAL_TABLET | Freq: Every day | ORAL | 6 refills | Status: AC

## 2023-10-28 NOTE — Patient Instructions (Signed)
 Great to see you today!!!  Medication Changes:  START Jardiance 10 mg daily  CHANGE Furosemide  to AS NEEDED ONLY   Follow-Up in: 6 months (DECEMBER), **PLEASE CALL OUR OFFICE IN OCTOBER TO SCHEDULE THIS APPOINTMENT   At the [facility], you and your health needs are our priority. We have a designated team specialized in the treatment of Heart Failure. This Care Team includes your primary Heart Failure Specialized Cardiologist (physician), Advanced Practice Providers (APPs- Physician Assistants and Nurse Practitioners), and Pharmacist who all work together to provide you with the care you need, when you need it.   You may see any of the following providers on your designated Care Team at your next follow up:  Dr. Toribio Fuel Dr. Ezra Shuck Dr. Ria Commander Dr. Odis Brownie Greig Mosses, NP Caffie Shed, [place] [facility] [place], [place] Beckey Coe, NP [place] Lee, NP Tinnie Redman, PharmD   Please be sure to bring in all your medications bottles to every appointment.   Need to Contact Us :  If you have any questions or concerns before your next appointment please send us  a message through [place] or call our office at [phone].    TO LEAVE A MESSAGE FOR THE NURSE SELECT OPTION 2, PLEASE LEAVE A MESSAGE INCLUDING: YOUR NAME DATE OF BIRTH CALL BACK NUMBER REASON FOR CALL**this is important as we prioritize the call backs  YOU WILL RECEIVE A CALL BACK THE SAME DAY AS LONG AS YOU CALL BEFORE 4:00 PM

## 2023-10-28 NOTE — Progress Notes (Signed)
 [facility] Note   PCP: Lenon Ludie BIRCH, FNP Dr. Nickey Primary Cardiologist: Dr Pietro HF Cardiologist: Dr. Cherrie  HPI: Christopher Roth is a 53 y.o. male with HTN, DM2, Tobacco abuse, CAD, PAD and systolic CHF.   S/P [employer] ICD 01/2018.   Admitted 12/17 with new onest HF. Echo EF 25-30%, Grade 2 DD. Diuresed 12 lbs. LHC recent occlusion of LAD.   cMRI 07/16/16 -> recalculated 08/19/16: EF 41% scar in LAD distribution. No viability   Echo (2/21): EF 30-35%  s/p RLE endovascular intervention and R TMA for osteomyelitis 02/2019. Followed by Dr. Sheree.  Echo (9/22): EF 45-50%, mild LVH, grade I DD, RV mildly reduced.  Echo 12/23 EF 50-55%, mild LVH, G1DD, RV normal.  Admitted 1/25 with left axillary artery thrombus => critical limb ischemia. Underwent LUE brachial artery thrombectomy, LUE forearm fasciotomy, carpal tunnel release, & hand fasciotomy. Required I&D and wound vac. Echo showed an LV thrombus, EF of 25-30%, RV not well seen but appeared reduced, akinetic apex with severe hypokinetic anteroseptum  Today he returns for HF follow up. Now back to work. Feels better. Denies CP or SOB. No edema, orthopnea or PND. Remains on Eliquis   Echo today 10/28/23 EF 45-50%    Cardiac studies: - Echo (1/25 at [facility]): EF 25-30%, + thrombus - Echo (12/23): EF 50-55%, mild LVH, G1DD, RV normal. - Echo (9/22): EF 45-50%, mild LVH, grade I DD, RV mildly reduced - Echo (2/21): EF 30-35% - cMRI (07/16/16) -> recalculated 08/19/16: EF 41% scar in LAD distribution. No viability   - LHC (04/23/16) - Recent total occlusion of the proximal to mid LAD.  - 70% ramus intermedius, 65% ostial circumflex followed by 70% mid circumflex, 75% proximal LAD prior to the origin of a small to moderate size diagonal, and 90% mid PDA. 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     Alcohol  Swabs  PADS 1 Package by Does not apply route as needed (with blood glucose checks). 1 each 0   apixaban  (ELIQUIS ) 5 MG TABS tablet Take 1 tablet (5 mg total) by mouth 2 (two) times daily. Start 06/21/2023 60 tablet 11   atorvastatin  (LIPITOR ) 80 MG tablet Take 1 tablet (80 mg total) by mouth daily. 90 tablet 3   carvedilol  (COREG ) 6.25 MG tablet Take 1 tablet (6.25 mg total) by mouth 2 (two) times daily. 180 tablet 3   furosemide  (LASIX ) 40 MG tablet Take 1 tablet (40 mg total) by mouth daily. 90 tablet 3   glucose blood (CHOICE DM FORA G20 TEST STRIPS) test strip Use as instructed 100 each 11   glucose monitoring kit (FREESTYLE) monitoring kit 1 each by Does not apply route as needed for other. 1 each 0  isosorbide -hydrALAZINE  (BIDIL ) 20-37.5 MG tablet Take 1 tablet by mouth 3 (three) times daily. 270 tablet 3   Lancets (FREESTYLE) lancets Use as instructed 100 each 11   sacubitril -valsartan  (ENTRESTO ) 97-103 MG Take 1 tablet by mouth 2 (two) times daily. 180 tablet 3   spironolactone  (ALDACTONE ) 25 MG tablet Take 1 tablet (25 mg total) by mouth daily. Needs appt for further refills 90 tablet 3   No current facility-administered medications for this encounter.   Facility-Administered Medications Ordered in Other Encounters  Medication Dose Route Frequency  Provider Last Rate Last Admin   perflutren lipid microspheres (DEFINITY) IV suspension  1-10 mL Intravenous PRN Clovis Mankins, Toribio SAUNDERS, MD   2 mL at 10/28/23 1004   Allergies  Allergen Reactions   Amoxicillin  Rash   Nickel Rash   Social History   Socioeconomic History   Marital status: Married    Spouse name: Not on file   Number of children: Not on file   Years of education: Not on file   Highest education level: Not on file  Occupational History   Not on file  Tobacco Use   Smoking status: Some Days    Current packs/day: 0.00    Average packs/day: 1 pack/day for 24.0 years (24.0 ttl pk-yrs)    Types: Cigars, Cigarettes    Start date: 03/27/1992    Last attempt to quit: 03/27/2016    Years since quitting: 7.5   Smokeless tobacco: Never   Tobacco comments:    h/o cigarettes with ongoing occasional cigar use  Vaping Use   Vaping status: Never Used  Substance and Sexual Activity   Alcohol  use: Yes    Comment: occ   Drug use: No   Sexual activity: Not on file  Other Topics Concern   Not on file  Social History Narrative   Not on file   Social Drivers of Health   Financial Resource Strain: Low Risk  (07/29/2023)   Received from [facility]    Overall Financial Resource Strain (CARDIA)    Difficulty of Paying Living Expenses: Not hard at all  Food Insecurity: No Food Insecurity (07/29/2023)   Received from [facility]   Hunger Vital Sign    Within the past 12 months, you worried that your food would run out before you got the money to buy more.: Never true    Within the past 12 months, the food you bought just didn't last and you didn't have money to get more.: Never true  Transportation Needs: No Transportation Needs (07/29/2023)   Received from [facility] - Transportation    Lack of Transportation (Medical): No    Lack of Transportation (Non-Medical): No  Physical Activity: Insufficiently Active (07/29/2023)   Received from [facility]   Exercise  Vital Sign    On average, how many days per week do you engage in moderate to strenuous exercise (like a brisk walk)?: 3 days    On average, how many minutes do you engage in exercise at this level?: 20 min  Stress: No Stress Concern Present (07/29/2023)   Received from [facility] - Occupational Stress Questionnaire    Feeling of Stress : Not at all  Social Connections: Somewhat Isolated (07/29/2023)   Received from [facility]   Social Network    How would you rate your social network (family, work, friends)?: Restricted participation with some degree of social isolation  Intimate Partner Violence: Not At Risk (07/29/2023)  Received from [facility]   HITS    Over the last 12 months how often did your partner physically hurt you?: Never    Over the last 12 months how often did your partner insult you or talk down to you?: Never    Over the last 12 months how often did your partner threaten you with physical harm?: Never    Over the last 12 months how often did your partner scream or curse at you?: Never   Family History  Problem Relation Age of Onset   Diabetes Mellitus II Mother    BP 120/88   Pulse 67   Wt 109.6 kg (241 lb 9.6 oz)   SpO2 97%   BMI 36.74 kg/m   Wt Readings from Last 3 Encounters:  10/28/23 109.6 kg (241 lb 9.6 oz)  07/31/23 103.9 kg (229 lb)  07/06/23 102.8 kg (226 lb 11.2 oz)   PHYSICAL EXAM: General:  Well appearing. No resp difficulty HEENT: normal Neck: supple. no JVD. Carotids 2+ bilat; no bruits. No lymphadenopathy or thryomegaly appreciated. Cor: PMI nondisplaced. Regular rate & rhythm. No rubs, gallops or murmurs. Lungs: clear Abdomen: soft, nontender, nondistended. No hepatosplenomegaly. No bruits or masses. Good bowel sounds. Extremities: no cyanosis, clubbing, rash, edema Neuro: alert & orientedx3, cranial nerves grossly intact. moves all 4 extremities w/o difficulty. Affect pleasant  ICD  interrogation (personally reviewed): volume ok. No VT/AF. darus  ReDs reading: n/a  ASSESSMENT & PLAN: 1. Chronic Combined Systolic and diastolic CHF -  ICM with Echo 87/7982 LVEF 25-30%, Grade 2 DD (EF 10-15% by cath) - Cath with chronic occlusion of LAD with no viability on MRI. Moderate non-obstructive CAD elsewhere - cMRI 07/16/16 -> recalculated 08/2016 EF 41%. Anterior scar with no significant viability - S/p [employer] ICD 01/2018  - Echo 2/21 EF 30-35% - Echo (9/22): EF 45-50%, mild LVH, grade I DD, RV mildly reduced. - Echo (12/23): EF 50-55%, mild LVH, G1DD, RV normal. - Echo (1/25 at [facility]): EF 25-30%, + clot - Echo today 10/28/23 45-50%  - NYHA I-II Volume ok  - Continue carvedilol  6.25 mg bid. - Continue Entresto  97/103 mg bid.  - Continue spiro 25 mg daily. - Continue BiDil  1 tab tid. - Start Jardiance 10. Switch lasix  to prn  - Recent bloodwork on 09/08/23 reviewed K 4.6 Scr 1.15 A1c 9.0  2. CAD via [facility] 04/2016 - Chronic occlusion of LAD with no viability on MRI. Moderate non-obstructive CAD elsewhere - No s/s angina - No ASA with Eliquis  - Continue statin. - Start jardiance  3. HTN - BP much improved on Bidil   4. LV thrombus - Seen on echo 1/25 at [facility]. - Now on Eliquis . 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 DM II - A1C 05/16/23 was 12 - He is on insulin  - Managed by PCP - Start Jardiance  6. Tobacco abuse - Now vaping. - Now trying to cut back.  - We discussed need for cessation   7. PAD - s/p PTA right anterior tibial and peroneal arteries followed by R foot TMA amputation 2020. - Followed by podiatry and vascular.  8. LUE thrombus (embolic)  - s/p LUE brachial artery thrombectomy, LUE forearm fasciotomy, in 1/25 - s/p I&D, delayed closure and L forearm skin graft - Healed  9. OSA - PSG (6/21) AHI 5.3. - Has CPAP but unable to tolerate mask - Follows with Dr. Shlomo - Consider [id number]  Toribio Fuel,  MD  10:55 AM

## 2023-10-28 NOTE — Progress Notes (Signed)
 Patient presents today to pick up diabetic shoes and insoles.  Patient was dispensed 1 pair of diabetic shoes and 3ea foam casted diabetic insoles Left Foot and Right Toefiller Fit was satisfactory. Instructions for break-in and wear was reviewed and a copy was given to the patient.   Re-appointment for regularly scheduled diabetic foot care visits or if they should experience any trouble with the shoes or insoles.

## 2023-10-30 ENCOUNTER — Ambulatory Visit (HOSPITAL_COMMUNITY): Payer: Self-pay | Admitting: Family Medicine

## 2023-11-20 ENCOUNTER — Encounter: Payer: Self-pay | Admitting: Advanced Practice Midwife

## 2023-11-26 ENCOUNTER — Ambulatory Visit: Payer: 59

## 2023-11-27 ENCOUNTER — Ambulatory Visit: Admitting: Podiatry

## 2023-12-09 ENCOUNTER — Ambulatory Visit (INDEPENDENT_AMBULATORY_CARE_PROVIDER_SITE_OTHER): Payer: 59

## 2023-12-09 DIAGNOSIS — I255 Ischemic cardiomyopathy: Secondary | ICD-10-CM | POA: Diagnosis not present

## 2023-12-10 LAB — CUP PACEART REMOTE DEVICE CHECK
Battery Remaining Longevity: 132 mo
Battery Remaining Percentage: 91 %
Brady Statistic RV Percent Paced: 0 %
Date Time Interrogation Session: 20250804190100
HighPow Impedance: 76 Ohm
Implantable Lead Connection Status: 753985
Implantable Lead Implant Date: 20190925
Implantable Lead Location: 753860
Implantable Lead Model: 293
Implantable Lead Serial Number: 441633
Implantable Pulse Generator Implant Date: 20190925
Lead Channel Impedance Value: 655 Ohm
Lead Channel Setting Pacing Amplitude: 2.5 V
Lead Channel Setting Pacing Pulse Width: 0.4 ms
Lead Channel Setting Sensing Sensitivity: 0.5 mV
Pulse Gen Serial Number: 244469

## 2023-12-21 ENCOUNTER — Ambulatory Visit: Admitting: Podiatry

## 2023-12-29 ENCOUNTER — Telehealth: Payer: Self-pay

## 2023-12-29 ENCOUNTER — Encounter: Payer: Self-pay | Admitting: Cardiology

## 2023-12-29 DIAGNOSIS — G4733 Obstructive sleep apnea (adult) (pediatric): Secondary | ICD-10-CM | POA: Insufficient documentation

## 2023-12-29 NOTE — Telephone Encounter (Signed)
 SABRA

## 2023-12-29 NOTE — Progress Notes (Unsigned)
 SLEEP MEDICINE VIRTUAL CONSULT NOTE via Video Note   Because of Christopher Roth's co-morbid illnesses, he is at least at moderate risk for complications without adequate follow up.  This format is felt to be most appropriate for this patient at this time.  All issues noted in this document were discussed and addressed.  A limited physical exam was performed with this format.  Please refer to the patient's chart for his consent to telehealth for [facility]      Date:  12/30/2023   ID:  Christopher Roth, DOB [birth year], MRN [id number] The patient was identified using 2 identifiers.  Patient Location: Home Provider Location: Home Office   PCP:  Lenon Ludie BIRCH, FNP   [facility] Providers Cardiologist:  Toribio Fuel, MD  Sleep Medicine:  Wilbert Bihari, MD     Evaluation Performed:  New Patient Evaluation  Chief Complaint:  OSA  History of Present Illness:    Christopher Roth is a 53 y.o. male who is being seen today for the evaluation of OSA at the request of Toribio Fuel, MD.  Christopher Roth is a 53 y.o. male with a hx of chronic systolic CHF, DM2 and PVD who was referred for home sleep study. His sleep study showed minimal OSA with ah AHI of 5.3/hr with no central events, but mild snoring and nocturnal hypoxemia with O2 sats < 88% for 6 minutes, shortened REM sleep latency.  I saw him in 2021 I ordered a ResMed auto CPAP from 4 to 15 cm H2O.  He was supposed to follow-up but never followed up.  He is now referred back for sleep medicine consultation to reestablish sleep care.  He tells me that he could not use the CPAP device.  He says that he cannot sleep with it on.  He tosses and turns and the mask becomes dislodged. He tried the FFM and the nasal pillow mask but both moved a lot when he tosses and turns. Stop Wynona is 7.    Past Medical History:  Diagnosis Date   Acute systolic congestive heart failure (HCC) 04/22/2016   AICD (automatic  cardioverter/defibrillator) present 01/27/2018   [employer]   Diabetes mellitus with peripheral vascular disease (HCC)    Type II   Diabetic foot ulcer (HCC) 01/31/2019   GERD (gastroesophageal reflux disease)    Hypertension    OSA (obstructive sleep apnea)    minimal OSA with ah AHI of 5.3/hr with no central events, but mild snoring and nocturnal hypoxemia with O2 sats < 88% for 6 minutes, shortened REM sleep latency.   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 (Right Toe)   AMPUTATION TOE Right 02/11/2019   Procedure: AMPUTATION OF TRANSMETATARSAL OF RIGHT FOOT.;  Surgeon: Tobie Franky SQUIBB, DPM;  Location: [facility] OR;  Service: Podiatry;  Laterality: Right;   CARDIAC CATHETERIZATION N/A 04/23/2016   Procedure: Right/Left Heart Cath and Coronary Angiography;  Surgeon: Victory ORN Sharps, MD;  Location: [facility] INVASIVE CV LAB;  Service: Cardiovascular;  Laterality: N/A;   ICD  IMPLANT N/A 01/27/2018   Procedure: ICD IMPLANT;  Surgeon: Fernande Elspeth BROCKS, MD;  Location: [facility] INVASIVE CV LAB;  Service: Cardiovascular;  Laterality: N/A;   INCISION AND DRAINAGE PERIRECTAL ABSCESS Right 11/17/2014   Procedure: debridement of right perianal and groin wound;  Surgeon: Donnice Lunger, MD;  Location: [facility] ORS;  Service: General;  Laterality: Right;   PERIPHERAL VASCULAR BALLOON ANGIOPLASTY Right 01/18/2019   Procedure: PERIPHERAL VASCULAR BALLOON ANGIOPLASTY;  Surgeon: Serene Gaile ORN, MD;  Location: [facility] INVASIVE CV LAB;  Service: Cardiovascular;  Laterality: Right;   posterior tibial, peroneal     Current Meds  Medication Sig   albuterol  (PROVENTIL  HFA;VENTOLIN  HFA) 108 (90 Base) MCG/ACT inhaler Inhale 2 puffs into the lungs every 6 (six) hours as needed for wheezing or shortness of breath.   Alcohol  Swabs  PADS 1 Package by Does not apply route as needed (with blood glucose checks).   apixaban  (ELIQUIS ) 5 MG TABS tablet Take 1 tablet (5 mg total) by mouth 2 (two) times daily. Start 06/21/2023   atorvastatin  (LIPITOR ) 80 MG tablet Take 1 tablet (80 mg total) by mouth daily.   carvedilol  (COREG ) 6.25 MG tablet Take 1 tablet (6.25 mg total) by mouth 2 (two) times daily.   empagliflozin  (JARDIANCE ) 10 MG TABS tablet Take 1 tablet (10 mg total) by mouth daily before breakfast.   furosemide  (LASIX ) 40 MG tablet Take 1 tablet (40 mg total) by mouth daily as needed.   glucose blood (CHOICE DM FORA G20 TEST STRIPS) test strip Use as instructed   glucose monitoring kit (FREESTYLE) monitoring kit 1 each by Does not apply route as needed for other.   isosorbide -hydrALAZINE  (BIDIL ) 20-37.5 MG tablet Take 1 tablet by mouth 3 (three) times daily.   Lancets (FREESTYLE) lancets Use as instructed   sacubitril -valsartan  (ENTRESTO ) 97-103 MG Take 1 tablet by mouth 2 (two) times daily.   spironolactone  (ALDACTONE ) 25 MG tablet Take 1 tablet (25 mg total) by mouth daily. Needs appt for further refills     Allergies:   Amoxicillin  and Nickel   Social History   Tobacco Use   Smoking status: Some Days    Current packs/day: 0.00    Average packs/day: 1 pack/day for 24.0 years (24.0 ttl pk-yrs)    Types: Cigars, Cigarettes    Start date: 03/27/1992    Last attempt to quit: 03/27/2016    Years since quitting: 7.7   Smokeless tobacco: Never   Tobacco comments:    h/o cigarettes with ongoing occasional cigar use  Vaping Use   Vaping status: Never Used  Substance Use Topics   Alcohol  use: Yes    Comment: occ   Drug use: No     Family Hx: The patient's family history  includes Diabetes Mellitus II in his mother.  ROS:   Please see the history of present illness.     All other systems reviewed and are negative.   Prior Sleep studies:   The following studies were reviewed today:  HST 2021  Labs/Other Tests and Data Reviewed:     Recent Labs: 06/19/2023: ALT 29; Hemoglobin 10.4; Platelets 457 07/31/2023: B Natriuretic Peptide 66.0; BUN 21; Creatinine, Ser 1.23; Potassium 5.3; Sodium 138    Wt Readings from Last 3 Encounters:  12/30/23 231 lb (104.8 kg)  10/28/23 241 lb 9.6 oz (109.6 kg)  07/31/23 229 lb (103.9 kg)        Objective:    Vital Signs:  BP 130/80   Ht 5' 8 (1.727 m)  Wt 231 lb (104.8 kg)   BMI 35.12 kg/m    VITAL SIGNS:  reviewed GEN:  no acute distress EYES:  sclerae anicteric, EOMI - Extraocular Movements Intact RESPIRATORY:  normal respiratory effort, symmetric expansion CARDIOVASCULAR:  no peripheral edema SKIN:  no rash, lesions or ulcers. MUSCULOSKELETAL:  no obvious deformities. NEURO:  alert and oriented x 3, no obvious focal deficit PSYCH:  normal affect  ASSESSMENT & PLAN:    OSA  -sleep study 2021 showed minimal OSA with ah AHI of 5.3/hr with no central events, but mild snoring and nocturnal hypoxemia with O2 sats < 88% for 6 minutes, shortened REM sleep latency.   -Saw him in 2021 and ordered a ResMed auto CPAP from 4 to 15 cm H2O but he never followed back with me -he tried using the CPAP and tried FFM and nasal pillow mask but he tosses a lot in bed and could not keep the masks in place and it completely disrupted his sleep. -he still has excessive daytime sleepiness with a Stop Bang score of 7 -will get an NPSG and if mild OSA then refer to ENT and sleep dentistry for oral device -if moderate then refer to ENT for Inspire device  Hypertension - BP has been controlled at home - Continue carvedilol  6.25 mg twice daily, BiDil  20-37.5 mg 3 times daily, Entresto  97-23 mg twice daily and spironolactone  25  mg daily with as needed refills  Time:   Today, I have spent 15 minutes with the patient with telehealth technology discussing the above problems.     Medication Adjustments/Labs and Tests Ordered: Current medicines are reviewed at length with the patient today.  Concerns regarding medicines are outlined above.   Tests Ordered: No orders of the defined types were placed in this encounter.   Medication Changes: No orders of the defined types were placed in this encounter.   Follow Up:  In Person in 1 year(s)  Signed, Wilbert Bihari, MD  12/30/2023 8:01 AM    [facility]

## 2023-12-30 ENCOUNTER — Ambulatory Visit: Attending: Cardiovascular Disease | Admitting: Cardiology

## 2023-12-30 VITALS — BP 130/80 | Ht 68.0 in | Wt 231.0 lb

## 2023-12-30 DIAGNOSIS — G4733 Obstructive sleep apnea (adult) (pediatric): Secondary | ICD-10-CM

## 2023-12-30 DIAGNOSIS — I10 Essential (primary) hypertension: Secondary | ICD-10-CM | POA: Diagnosis not present

## 2023-12-30 NOTE — Patient Instructions (Signed)

## 2024-01-07 ENCOUNTER — Telehealth: Payer: Self-pay | Admitting: *Deleted

## 2024-01-07 DIAGNOSIS — I2510 Atherosclerotic heart disease of native coronary artery without angina pectoris: Secondary | ICD-10-CM

## 2024-01-07 DIAGNOSIS — G4733 Obstructive sleep apnea (adult) (pediatric): Secondary | ICD-10-CM

## 2024-01-07 DIAGNOSIS — I5022 Chronic systolic (congestive) heart failure: Secondary | ICD-10-CM

## 2024-01-07 DIAGNOSIS — I10 Essential (primary) hypertension: Secondary | ICD-10-CM

## 2024-01-07 NOTE — Telephone Encounter (Signed)
-----   Message from Wilbert Bihari sent at 01/07/2024  4:11 PM EDT ----- This is the patient who I ordered that NPSG on on 27 August

## 2024-01-08 NOTE — Telephone Encounter (Signed)
**Note De-Identified Christopher Roth Obfuscation** I called [employer] and s/w Joy R who advised me that a PA is not required for a NPSG. Reference #: [id number]  I have  notified the pt and I proved him with the Sleep Lab's phone number so he can call them to be scheduled.  He verbalized understanding and thanked me for my call.  I have transferred the order to the sleep lab.

## 2024-01-08 NOTE — Telephone Encounter (Addendum)
**Note De-Identified Anaiyah Anglemyer Obfuscation** I called [employer] and S/w Tracie who confirmed that the pts coverage is active with them and she transferred my call to the PA dept. Reference#: [id number] Per message on the PA dept VM they are not currently open and to call back at 8 am. I will call them back later today to do this NPSG PA.

## 2024-01-18 ENCOUNTER — Ambulatory Visit: Payer: 59

## 2024-02-01 NOTE — Progress Notes (Signed)
 Remote ICD Transmission

## 2024-02-23 ENCOUNTER — Ambulatory Visit (HOSPITAL_BASED_OUTPATIENT_CLINIC_OR_DEPARTMENT_OTHER): Attending: Cardiology | Admitting: Cardiology

## 2024-02-23 DIAGNOSIS — I10 Essential (primary) hypertension: Secondary | ICD-10-CM

## 2024-02-23 DIAGNOSIS — I2510 Atherosclerotic heart disease of native coronary artery without angina pectoris: Secondary | ICD-10-CM | POA: Insufficient documentation

## 2024-02-23 DIAGNOSIS — I5022 Chronic systolic (congestive) heart failure: Secondary | ICD-10-CM | POA: Insufficient documentation

## 2024-02-23 DIAGNOSIS — G4733 Obstructive sleep apnea (adult) (pediatric): Secondary | ICD-10-CM | POA: Diagnosis not present

## 2024-02-23 DIAGNOSIS — I110 Hypertensive heart disease with heart failure: Secondary | ICD-10-CM | POA: Diagnosis not present

## 2024-02-25 ENCOUNTER — Ambulatory Visit: Payer: 59

## 2024-02-27 ENCOUNTER — Ambulatory Visit: Payer: Self-pay | Admitting: Cardiology

## 2024-03-01 NOTE — Procedures (Addendum)
 Indications for Polysomnography The patient is a 53 year old Male who is 5' 8 and weighs 234.0 lbs. His BMI equals 35.9.  A full night polysomnogram was performed to evaluate for obstructive sleep apnea.  Medications Taken:NO MEDICATIONS TAKEN. Polysomnogram Data A full night polysomnogram recorded the standard physiologic parameters including EEG, EOG, EMG, EKG, nasal and oral airflow.  Respiratory parameters of chest and abdominal movements were recorded with Respiratory Inductance Plethysmography belts.   Oxygen saturation was recorded by pulse oximetry.  Sleep Architecture The total recording time of the polysomnogram was 411.8 minutes.  The total sleep time was 326.0 minutes.  The patient spent 2.3% of total sleep time in Stage N1, 73.9% in Stage N2, 4.1% in Stages N3, and 19.6% in REM.  Sleep latency was 58.8 minutes.   REM latency was 69.0 minutes.  Sleep Efficiency was 79.2%.  Wake after Sleep Onset time was 27.0 minutes.  Respiratory Events The polysomnogram revealed a presence of 58 obstructive, 10 central, and 13 mixed apneas resulting in an Apnea index of 14.9 events per hour.  There were 131 hypopneas (GreaterEqual to3% desaturation and/or arousal) resulting in an Apnea\Hypopnea Index  (AHI GreaterEqual to3% desaturation and/or arousal) of 39.0 events per hour.  There were 85 hypopneas (GreaterEqual to4% desaturation) resulting in an Apnea\Hypopnea Index (AHI GreaterEqual to4% desaturation) of 30.6 events per hour.  There were 28  Respiratory Effort Related Arousals resulting in a RERA index of 5.2 events per hour. The Respiratory Disturbance Index is 44.2 events per hour.  The snore index was 370.5 events per hour.  Mean oxygen saturation was 90.6%.  The lowest oxygen saturation during sleep was 78.0%.  Time spent LessEqual to88% oxygen saturation was  minutes ().  Limb Activity There were 0 total limb movements recorded.  Cardiac Summary The average pulse rate was 69.5 bpm.   The minimum pulse rate was 61.0 bpm while the maximum pulse rate was 95.0 bpm.  Cardiac rhythm was normal sinus rhythm with PVCs.   Diagnosis: Severe obstructive sleep apnea Nocturnal hypoxemia  Recommendations: 1.  Clinical correlation of these findings is necessary.  The decision to treat obstructive sleep apnea (OSA) is usually based on the presence of apnea symptoms or the presence of associated medical conditions such as Hypertension, Congestive Heart  Failure, Atrial Fibrillation or Obesity.  The most common symptoms of OSA are snoring, gasping for breath while sleeping, daytime sleepiness and fatigue.  2.  Initiating apnea therapy is recommended given the presence of symptoms and/or associated conditions. Recommend proceeding with one of the following:  a.  Auto-CPAP therapy with a pressure range of 5-20cm H2O.  b.  An oral appliance (OA) that can be obtained from certain dentists with expertise in sleep medicine.  These are primarily of use in non-obese patients with mild and moderate disease.  c.  An ENT consultation which may be useful to look for specific causes of obstruction and possible treatment options.  d.  If patient is intolerant to PAP therapy, consider referral to ENT for evaluation for hypoglossal nerve stimulator.  3.  Close follow-up is necessary to ensure success with CPAP or oral appliance therapy for maximum benefit.  4.  A follow-up oximetry study on CPAP is recommended to assess the adequacy of therapy and determine the need for supplemental oxygen or the potential need for Bi-level therapy.  An arterial blood gas to determine the adequacy of baseline ventilation  and oxygenation should also be considered.  5.  Healthy sleep recommendations include:  adequate nightly sleep (normal 7-9 hrs/night), avoidance of caffeine after noon and alcohol  near bedtime, and maintaining a sleep environment that is cool, dark and quiet.  6.  Weight loss for overweight patients  is recommended.  Even modest amounts of weight loss can significantly improve the severity of sleep apnea.  7.  Snoring recommendations include:  weight loss where appropriate, side sleeping, and avoidance of alcohol  before bed.  8.  Operation of motor vehicle should be avoided when sleepy.     This study was personally reviewed and electronically signed by: SHLOMO WILBERT SAUNDERS., MD Accredited Board Certified in Sleep Medicine Date/Time: 03/01/2024 5:52 PM

## 2024-03-07 ENCOUNTER — Telehealth: Payer: Self-pay | Admitting: *Deleted

## 2024-03-07 DIAGNOSIS — G4733 Obstructive sleep apnea (adult) (pediatric): Secondary | ICD-10-CM

## 2024-03-07 NOTE — Telephone Encounter (Signed)
 Called results no voicemail set up.

## 2024-03-07 NOTE — Telephone Encounter (Signed)
-----   Message from Wilbert Bihari sent at 03/01/2024  5:54 PM EDT ----- Let pt know he has severe OSA and refer to Dr. Carlie for [facility] as he is intolerant to CPAP

## 2024-03-09 ENCOUNTER — Ambulatory Visit

## 2024-03-09 ENCOUNTER — Encounter: Payer: Self-pay | Admitting: *Deleted

## 2024-03-09 DIAGNOSIS — I255 Ischemic cardiomyopathy: Secondary | ICD-10-CM

## 2024-03-10 LAB — CUP PACEART REMOTE DEVICE CHECK
Battery Remaining Longevity: 126 mo
Battery Remaining Percentage: 88 %
Brady Statistic RV Percent Paced: 0 %
Date Time Interrogation Session: 20251105165500
HighPow Impedance: 73 Ohm
Implantable Lead Connection Status: 753985
Implantable Lead Implant Date: 20190925
Implantable Lead Location: 753860
Implantable Lead Model: 293
Implantable Lead Serial Number: 441633
Implantable Pulse Generator Implant Date: 20190925
Lead Channel Impedance Value: 620 Ohm
Lead Channel Setting Pacing Amplitude: 2.5 V
Lead Channel Setting Pacing Pulse Width: 0.4 ms
Lead Channel Setting Sensing Sensitivity: 0.5 mV
Pulse Gen Serial Number: 244469

## 2024-03-10 NOTE — Telephone Encounter (Signed)
 The patient has been notified of the result and verbalized understanding.  All questions (if any) were answered. Joshua Dalton Seip, CMA 03/10/2024 1:16 PM    Patient understands he will be referred to Dr Carlie for [facility] device.

## 2024-03-10 NOTE — Telephone Encounter (Signed)
 Call to patient to advise of referral to Dr. Carlie, patient verbalizes understanding.

## 2024-03-10 NOTE — Addendum Note (Signed)
 Addended by: JANIT GENI CROME on: 03/10/2024 03:55 PM   Modules accepted: Orders

## 2024-03-10 NOTE — Progress Notes (Signed)
 Remote ICD Transmission

## 2024-03-13 ENCOUNTER — Ambulatory Visit: Payer: Self-pay | Admitting: Student in an Organized Health Care Education/Training Program

## 2024-05-11 ENCOUNTER — Telehealth (HOSPITAL_COMMUNITY): Payer: Self-pay | Admitting: Internal Medicine

## 2024-05-14 ENCOUNTER — Encounter: Payer: Self-pay | Admitting: Cardiology

## 2024-05-17 ENCOUNTER — Ambulatory Visit (HOSPITAL_COMMUNITY)
Admission: RE | Admit: 2024-05-17 | Discharge: 2024-05-17 | Disposition: A | Discharge status: Home / Self Care | Source: Ambulatory Visit

## 2024-05-17 NOTE — Progress Notes (Incomplete)
 "   Advanced Heart Failure Clinic Note   PCP: Ulanda Montenegro, MD [facility]) Primary Cardiologist: Dr Pietro HF Cardiologist: Dr. Cherrie  HPI: Christopher Roth is a 54 y.o. male with HTN, DM2, Tobacco abuse, CAD, PAD and systolic CHF.   S/P Bos[employer]i ICD 01/2018.   Admitted 12/17 with new onest HF. Echo EF 25-30%, Grade 2 DD. Diuresed 12 lbs. LHC recent occlusion of LAD.   cMRI 07/16/16 -> recalculated 08/19/16: EF 41% scar in LAD distribution. No viability   Echo (2/21): EF 30-35%  s/p RLE endovascular intervention and R TMA for osteomyelitis 02/2019. Followed by Dr. Sheree.  Echo (9/22): EF 45-50%, mild LVH, grade I DD, RV mildly reduced.  Echo 12/23 EF 50-55%, mild LVH, G1DD, RV normal.  Admitted 1/25 with left axillary artery thrombus => critical limb ischemia. Underwent LUE brachial artery thrombectomy, LUE forearm fasciotomy, carpal tunnel release, & hand fasciotomy. Required I&D and wound vac. Echo showed an LV thrombus, EF of 25-30%, RV not well seen but appeared reduced, akinetic apex with severe hypokinetic anteroseptum  Today he returns for HF follow up. Now back to work. Feels better. Denies CP or SOB. No edema, orthopnea or PND. Remains on Eliquis   Echo today 10/28/23 EF 45-50%    Cardiac studies: - Echo (1/25 at [facility]): EF 25-30%, + thrombus - Echo (12/23): EF 50-55%, mild LVH, G1DD, RV normal. - Echo (9/22): EF 45-50%, mild LVH, grade I DD, RV mildly reduced - Echo (2/21): EF 30-35% - cMRI (07/16/16) -> recalculated 08/19/16: EF 41% scar in LAD distribution. No viability   - LHC (04/23/16) - Recent total occlusion of the proximal to mid LAD.  - 70% ramus intermedius, 65% ostial circumflex followed by 70% mid circumflex, 75% proximal LAD prior to the origin of a small to moderate size diagonal, and 90% mid PDA. - Mild PAH, PCWP 14 - LVEF 10-15%   Past Medical History:  Diagnosis Date   Acute systolic congestive heart failure (HCC) 04/22/2016   AICD (automatic  cardioverter/defibrillator) present 01/27/2018   [employer]   Diabetes mellitus with peripheral vascular disease (HCC)    Type II   Diabetic foot ulcer (HCC) 01/31/2019   GERD (gastroesophageal reflux disease)    Hypertension    OSA (obstructive sleep apnea)    minimal OSA with ah AHI of 5.3/hr with no central events, but mild snoring and nocturnal hypoxemia with O2 sats < 88% for 6 minutes, shortened REM sleep latency.   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     Alcohol  Swabs  PADS 1 Package by Does not apply route as needed (with blood glucose checks). 1 each 0   apixaban  (ELIQUIS ) 5 MG TABS tablet Take 1 tablet (5 mg total) by mouth 2 (two) times daily. Start 06/21/2023 60 tablet 11   atorvastatin  (LIPITOR ) 80 MG tablet Take 1 tablet (80 mg total) by mouth daily. 90 tablet 3   carvedilol  (COREG ) 6.25 MG tablet Take 1 tablet (6.25 mg total) by mouth 2 (two) times daily. 180 tablet 3   empagliflozin  (JARDIANCE ) 10 MG TABS tablet Take 1 tablet (10 mg total) by mouth daily before breakfast. 30 tablet  6   furosemide  (LASIX ) 40 MG tablet Take 1 tablet (40 mg total) by mouth daily as needed.     glucose blood (CHOICE DM FORA G20 TEST STRIPS) test strip Use as instructed 100 each 11   glucose monitoring kit (FREESTYLE) monitoring kit 1 each by Does not apply route as needed for other. 1 each 0   isosorbide -hydrALAZINE  (BIDIL ) 20-37.5 MG tablet Take 1 tablet by mouth 3 (three) times daily. 270 tablet 3   Lancets (FREESTYLE) lancets Use as instructed 100 each 11   sacubitril -valsartan  (ENTRESTO ) 97-103 MG Take 1 tablet by mouth 2 (two) times daily. 180 tablet 3    spironolactone  (ALDACTONE ) 25 MG tablet Take 1 tablet (25 mg total) by mouth daily. Needs appt for further refills 90 tablet 3   No current facility-administered medications for this visit.   Allergies  Allergen Reactions   Amoxicillin  Rash   Nickel Rash   Social History   Socioeconomic History   Marital status: Married    Spouse name: Not on file   Number of children: Not on file   Years of education: Not on file   Highest education level: Not on file  Occupational History   Not on file  Tobacco Use   Smoking status: Some Days    Current packs/day: 0.00    Average packs/day: 1 pack/day for 24.0 years (24.0 ttl pk-yrs)    Types: Cigars, Cigarettes    Start date: 03/27/1992    Last attempt to quit: 03/27/2016    Years since quitting: 8.1   Smokeless tobacco: Never   Tobacco comments:    h/o cigarettes with ongoing occasional cigar use  Vaping Use   Vaping status: Never Used  Substance and Sexual Activity   Alcohol  use: Yes    Comment: occ   Drug use: No   Sexual activity: Not on file  Other Topics Concern   Not on file  Social History Narrative   Not on file   Social Drivers of Health   Tobacco Use: Medium Risk (03/28/2024)   Received from [facility]   Patient History    Smoking Tobacco Use: Former    Smokeless Tobacco Use: Never    Passive Exposure: Current  Physicist, Medical Strain: Low Risk (07/29/2023)   Received from [facility]   Overall Financial Resource Strain (CARDIA)    Difficulty of Paying Living Expenses: Not hard at all  Food Insecurity: No Food Insecurity (07/29/2023)   Received from [facility]   Epic    Within the past 12 months, you worried that your food would run out before you got the money to buy more.: Never true    Within the past 12 months, the food you bought just didn't last and you didn't have money to get more.: Never true  Transportation Needs: No Transportation Needs (07/29/2023)   Received from [facility]  - Transportation    Lack of Transportation (Medical): No    Lack of Transportation (Non-Medical): No  Physical Activity: Insufficiently Active (07/29/2023)   Received from [facility]   Exercise Vital Sign    On average, how many days per week do you engage in moderate to strenuous exercise (like a brisk walk)?: 3 days    On average, how many minutes do you engage in exercise at this level?: 20 min  Stress: No Stress Concern Present (07/29/2023)   Received from [facility]ational [facility] - Occupational Stress Questionnaire  Feeling of Stress : Not at all  Social Connections: Somewhat Isolated (07/29/2023)   Received from [facility]   Social Network    How would you rate your social network (family, work, friends)?: Restricted participation with some degree of social isolation  Intimate Partner Violence: Not At Risk (07/29/2023)   Received from [facility]   HITS    Over the last 12 months how often did your partner physically hurt you?: Never    Over the last 12 months how often did your partner insult you or talk down to you?: Never    Over the last 12 months how often did your partner threaten you with physical harm?: Never    Over the last 12 months how often did your partner scream or curse at you?: Never  Depression (PHQ2-9): Not on file  Alcohol  Screen: Not on file  Housing: Low Risk (07/29/2023)   Received from [facility]    In the last 12 months, was there a time when you were not able to pay the mortgage or rent on time?: No    In the past 12 months, how many times have you moved where you were living?: 0    At any time in the past 12 months, were you homeless or living in a shelter (including now)?: No  Utilities: Not At Risk (07/29/2023)   Received from [facility] Utilities    Threatened with loss of utilities: No  Health Literacy: Not on file   Family History  Problem Relation Age of Onset   Diabetes Mellitus II  Mother    There were no vitals taken for this visit.  Wt Readings from Last 3 Encounters:  02/23/24 106.1 kg (234 lb)  12/30/23 104.8 kg (231 lb)  10/28/23 109.6 kg (241 lb 9.6 oz)   PHYSICAL EXAM: General:  Well appearing. No resp difficulty HEENT: normal Neck: supple. no JVD. Carotids 2+ bilat; no bruits. No lymphadenopathy or thryomegaly appreciated. Cor: PMI nondisplaced. Regular rate & rhythm. No rubs, gallops or murmurs. Lungs: clear Abdomen: soft, nontender, nondistended. No hepatosplenomegaly. No bruits or masses. Good bowel sounds. Extremities: no cyanosis, clubbing, rash, edema Neuro: alert & orientedx3, cranial nerves grossly intact. moves all 4 extremities w/o difficulty. Affect pleasant  ICD interrogation (personally reviewed): volume ok. No VT/AF. darus  ReDs reading: n/a  ASSESSMENT & PLAN: 1. Chronic Combined Systolic and diastolic CHF -  ICM with Echo 87/7982 LVEF 25-30%, Grade 2 DD (EF 10-15% by cath) - Cath with chronic occlusion of LAD with no viability on MRI. Moderate non-obstructive CAD elsewhere - cMRI 07/16/16 -> recalculated 08/2016 EF 41%. Anterior scar with no significant viability - S/p [employer] ICD 01/2018  - Echo 2/21 EF 30-35% - Echo (9/22): EF 45-50%, mild LVH, grade I DD, RV mildly reduced. - Echo (12/23): EF 50-55%, mild LVH, G1DD, RV normal. - Echo (1/25 at [facility]): EF 25-30%, + clot - Echo today 10/28/23 45-50%  - NYHA I-II Volume ok  - Continue carvedilol  6.25 mg bid. - Continue Entresto  97/103 mg bid.  - Continue spiro 25 mg daily. - Continue BiDil  1 tab tid. - Start Jardiance  10. Switch lasix  to prn  - Recent bloodwork on 09/08/23 reviewed K 4.6 Scr 1.15 A1c 9.0  2. CAD via [facility] 04/2016 - Chronic occlusion of LAD with no viability on MRI. Moderate non-obstructive CAD elsewhere - No s/s angina - No ASA with Eliquis  - Continue statin. - Start jardiance   3. HTN - BP much improved on Bidil   4. LV thrombus - Seen on echo 1/25  at [facility]. - Now on Eliquis . 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 DM II - A1C 05/16/23 was 12 - He is on insulin  - Managed by PCP - Start Jardiance   6. Tobacco abuse - Now vaping. - Now trying to cut back.  - We discussed need for cessation   7. PAD - s/p PTA right anterior tibial and peroneal arteries followed by R foot TMA amputation 2020. - Followed by podiatry and vascular.  8. LUE thrombus (embolic)  - s/p LUE brachial artery thrombectomy, LUE forearm fasciotomy, in 1/25 - s/p I&D, delayed closure and L forearm skin graft - Healed  9. OSA - Sleep study 10/25: Severe OSA with nocturnal hypoxemia - Unable to tolerate CPAP - Refer to *** for GLP1-RA  Follow-up  Brenly Trawick N, PA-C  9:20 AM "

## 2024-05-25 ENCOUNTER — Ambulatory Visit: Admitting: Physician Assistant

## 2024-05-27 ENCOUNTER — Ambulatory Visit (HOSPITAL_COMMUNITY)

## 2024-05-27 NOTE — Progress Notes (Incomplete)
 "   Advanced Heart Failure Clinic Note   PCP: Ulanda Montenegro, MD [facility]) Primary Cardiologist: Dr Pietro HF Cardiologist: Dr. Cherrie  HPI: Christopher Roth is a 54 y.o. male with HTN, DM2, Tobacco abuse, CAD, PAD and systolic CHF.   S/P [employer] ICD 01/2018.   Admitted 12/17 with new onest HF. Echo EF 25-30%, Grade 2 DD. Diuresed 12 lbs. LHC recent occlusion of LAD.   cMRI 07/16/16 -> recalculated 08/19/16: EF 41% scar in LAD distribution. No viability   Echo (2/21): EF 30-35%  s/p RLE endovascular intervention and R TMA for osteomyelitis 02/2019. Followed by Dr. Sheree.  Echo (9/22): EF 45-50%, mild LVH, grade I DD, RV mildly reduced.  Echo 12/23 EF 50-55%, mild LVH, G1DD, RV normal.  Admitted 1/25 with left axillary artery thrombus => critical limb ischemia. Underwent LUE brachial artery thrombectomy, LUE forearm fasciotomy, carpal tunnel release, & hand fasciotomy. Required I&D and wound vac. Echo showed an LV thrombus, EF of 25-30%, RV not well seen but appeared reduced, akinetic apex with severe hypokinetic anteroseptum  Today he returns for HF follow up. Now back to work. Feels better. Denies CP or SOB. No edema, orthopnea or PND. Remains on Eliquis   Echo today 10/28/23 EF 45-50%    Cardiac studies: - Echo (1/25 at [facility]): EF 25-30%, + thrombus - Echo (12/23): EF 50-55%, mild LVH, G1DD, RV normal. - Echo (9/22): EF 45-50%, mild LVH, grade I DD, RV mildly reduced - Echo (2/21): EF 30-35% - cMRI (07/16/16) -> recalculated 08/19/16: EF 41% scar in LAD distribution. No viability   - LHC (04/23/16) - Recent total occlusion of the proximal to mid LAD.  - 70% ramus intermedius, 65% ostial circumflex followed by 70% mid circumflex, 75% proximal LAD prior to the origin of a small to moderate size diagonal, and 90% mid PDA. - Mild PAH, PCWP 14 - LVEF 10-15%   Past Medical History:  Diagnosis Date   Acute systolic congestive heart failure (HCC) 04/22/2016   AICD (automatic  cardioverter/defibrillator) present 01/27/2018   [employer]   Diabetes mellitus with peripheral vascular disease (HCC)    Type II   Diabetic foot ulcer (HCC) 01/31/2019   GERD (gastroesophageal reflux disease)    Hypertension    OSA (obstructive sleep apnea)    minimal OSA with ah AHI of 5.3/hr with no central events, but mild snoring and nocturnal hypoxemia with O2 sats < 88% for 6 minutes, shortened REM sleep latency.   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     Alcohol  Swabs  PADS 1 Package by Does not apply route as needed (with blood glucose checks). 1 each 0   apixaban  (ELIQUIS ) 5 MG TABS tablet Take 1 tablet (5 mg total) by mouth 2 (two) times daily. Start 06/21/2023 60 tablet 11   atorvastatin  (LIPITOR ) 80 MG tablet Take 1 tablet (80 mg total) by mouth daily. 90 tablet 3   carvedilol  (COREG ) 6.25 MG tablet Take 1 tablet (6.25 mg total) by mouth 2 (two) times daily. 180 tablet 3   empagliflozin  (JARDIANCE ) 10 MG TABS tablet Take 1 tablet (10 mg total) by mouth daily before breakfast. 30 tablet  6   furosemide  (LASIX ) 40 MG tablet Take 1 tablet (40 mg total) by mouth daily as needed.     glucose blood (CHOICE DM FORA G20 TEST STRIPS) test strip Use as instructed 100 each 11   glucose monitoring kit (FREESTYLE) monitoring kit 1 each by Does not apply route as needed for other. 1 each 0   isosorbide -hydrALAZINE  (BIDIL ) 20-37.5 MG tablet Take 1 tablet by mouth 3 (three) times daily. 270 tablet 3   Lancets (FREESTYLE) lancets Use as instructed 100 each 11   sacubitril -valsartan  (ENTRESTO ) 97-103 MG Take 1 tablet by mouth 2 (two) times daily. 180 tablet 3    spironolactone  (ALDACTONE ) 25 MG tablet Take 1 tablet (25 mg total) by mouth daily. Needs appt for further refills 90 tablet 3   No current facility-administered medications for this visit.   Allergies  Allergen Reactions   Amoxicillin  Rash   Nickel Rash   Social History   Socioeconomic History   Marital status: Married    Spouse name: Not on file   Number of children: Not on file   Years of education: Not on file   Highest education level: Not on file  Occupational History   Not on file  Tobacco Use   Smoking status: Some Days    Current packs/day: 0.00    Average packs/day: 1 pack/day for 24.0 years (24.0 ttl pk-yrs)    Types: Cigars, Cigarettes    Start date: 03/27/1992    Last attempt to quit: 03/27/2016    Years since quitting: 8.1   Smokeless tobacco: Never   Tobacco comments:    h/o cigarettes with ongoing occasional cigar use  Vaping Use   Vaping status: Never Used  Substance and Sexual Activity   Alcohol  use: Yes    Comment: occ   Drug use: No   Sexual activity: Not on file  Other Topics Concern   Not on file  Social History Narrative   Not on file   Social Drivers of Health   Tobacco Use: Medium Risk (03/28/2024)   Received from [facility]   Patient History    Smoking Tobacco Use: Former    Smokeless Tobacco Use: Never    Passive Exposure: Current  Physicist, Medical Strain: Low Risk (07/29/2023)   Received from [facility]   Overall Financial Resource Strain (CARDIA)    Difficulty of Paying Living Expenses: Not hard at all  Food Insecurity: No Food Insecurity (07/29/2023)   Received from [facility]   Epic    Within the past 12 months, you worried that your food would run out before you got the money to buy more.: Never true    Within the past 12 months, the food you bought just didn't last and you didn't have money to get more.: Never true  Transportation Needs: No Transportation Needs (07/29/2023)   Received from [facility]  - Transportation    Lack of Transportation (Medical): No    Lack of Transportation (Non-Medical): No  Physical Activity: Insufficiently Active (07/29/2023)   Received from [facility]   Exercise Vital Sign    On average, how many days per week do you engage in moderate to strenuous exercise (like a brisk walk)?: 3 days    On average, how many minutes do you engage in exercise at this level?: 20 min  Stress: No Stress Concern Present (07/29/2023)   Received from [facility] of Occupational Health - Occupational Stress Questionnaire  Feeling of Stress : Not at all  Social Connections: Somewhat Isolated (07/29/2023)   Received from [facility]   Social Network    How would you rate your social network (family, work, friends)?: Restricted participation with some degree of social isolation  Intimate Partner Violence: Not At Risk (07/29/2023)   Received from [facility]   HITS    Over the last 12 months how often did your partner physically hurt you?: Never    Over the last 12 months how often did your partner insult you or talk down to you?: Never    Over the last 12 months how often did your partner threaten you with physical harm?: Never    Over the last 12 months how often did your partner scream or curse at you?: Never  Depression (PHQ2-9): Not on file  Alcohol  Screen: Not on file  Housing: Low Risk (07/29/2023)   Received from [facility]    In the last 12 months, was there a time when you were not able to pay the mortgage or rent on time?: No    In the past 12 months, how many times have you moved where you were living?: 0    At any time in the past 12 months, were you homeless or living in a shelter (including now)?: No  Utilities: Not At Risk (07/29/2023)   Received from [facility]ities    Threatened with loss of utilities: No  Health Literacy: Not on file   Family History  Problem Relation Age of Onset   Diabetes Mellitus II  Mother    There were no vitals taken for this visit.  Wt Readings from Last 3 Encounters:  02/23/24 106.1 kg (234 lb)  12/30/23 104.8 kg (231 lb)  10/28/23 109.6 kg (241 lb 9.6 oz)   PHYSICAL EXAM: General:  Well appearing. No resp difficulty HEENT: normal Neck: supple. no JVD. Carotids 2+ bilat; no bruits. No lymphadenopathy or thryomegaly appreciated. Cor: PMI nondisplaced. Regular rate & rhythm. No rubs, gallops or murmurs. Lungs: clear Abdomen: soft, nontender, nondistended. No hepatosplenomegaly. No bruits or masses. Good bowel sounds. Extremities: no cyanosis, clubbing, rash, edema Neuro: alert & orientedx3, cranial nerves grossly intact. moves all 4 extremities w/o difficulty. Affect pleasant  ICD interrogation (personally reviewed): volume ok. No VT/AF. darus  ReDs reading: n/a  ASSESSMENT & PLAN: 1. Chronic Combined Systolic and diastolic CHF -  ICM with Echo 87/7982 LVEF 25-30%, Grade 2 DD (EF 10-15% by cath) - Cath with chronic occlusion of LAD with no viability on MRI. Moderate non-obstructive CAD elsewhere - cMRI 07/16/16 -> recalculated 08/2016 EF 41%. Anterior scar with no significant viability - S/p [employer] ICD 01/2018  - Echo 2/21 EF 30-35% - Echo (9/22): EF 45-50%, mild LVH, grade I DD, RV mildly reduced. - Echo (12/23): EF 50-55%, mild LVH, G1DD, RV normal. - Echo (1/25 at [facility]): EF 25-30%, + clot - Echo today 10/28/23 45-50%  - NYHA I-II Volume ok  - Continue carvedilol  6.25 mg bid. - Continue Entresto  97/103 mg bid.  - Continue spiro 25 mg daily. - Continue BiDil  1 tab tid. - Start Jardiance  10. Switch lasix  to prn  - Recent bloodwork on 09/08/23 reviewed K 4.6 Scr 1.15 A1c 9.0  2. CAD via [facility] 04/2016 - Chronic occlusion of LAD with no viability on MRI. Moderate non-obstructive CAD elsewhere - No s/s angina - No ASA with Eliquis  - Continue statin. - Start jardiance   3. HTN - BP much improved on Bidil   4. LV thrombus - Seen on echo 1/25  at [facility]. - Now on Eliquis . 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 DM II - A1C 05/16/23 was 12 - He is on insulin  - Managed by PCP - Start Jardiance   6. Tobacco abuse - Now vaping. - Now trying to cut back.  - We discussed need for cessation   7. PAD - s/p PTA right anterior tibial and peroneal arteries followed by R foot TMA amputation 2020. - Followed by podiatry and vascular.  8. LUE thrombus (embolic)  - s/p LUE brachial artery thrombectomy, LUE forearm fasciotomy, in 1/25 - s/p I&D, delayed closure and L forearm skin graft - Healed  9. OSA - Sleep study 10/25: Severe OSA with nocturnal hypoxemia - Unable to tolerate CPAP - Refer to *** for GLP1-RA  Follow-up  Caffie Shed, PA-C  2:24 PM "

## 2024-06-08 ENCOUNTER — Ambulatory Visit

## 2024-06-09 LAB — CUP PACEART REMOTE DEVICE CHECK
Battery Remaining Longevity: 126 mo
Battery Remaining Percentage: 86 %
Brady Statistic RV Percent Paced: 0 %
Date Time Interrogation Session: 20260204044500
HighPow Impedance: 64 Ohm
Implantable Lead Connection Status: 753985
Implantable Lead Implant Date: 20190925
Implantable Lead Location: 753860
Implantable Lead Model: 293
Implantable Lead Serial Number: 441633
Implantable Pulse Generator Implant Date: 20190925
Lead Channel Impedance Value: 583 Ohm
Lead Channel Setting Pacing Amplitude: 2.5 V
Lead Channel Setting Pacing Pulse Width: 0.4 ms
Lead Channel Setting Sensing Sensitivity: 0.5 mV
Pulse Gen Serial Number: 244469

## 2024-09-07 ENCOUNTER — Ambulatory Visit

## 2024-12-07 ENCOUNTER — Ambulatory Visit

## 2025-03-08 ENCOUNTER — Ambulatory Visit
